# Patient Record
Sex: Female | Born: 1960 | Race: White | Hispanic: No | Smoking: Never smoker
Health system: Southern US, Community
[De-identification: ages and names within clinical notes are randomized; demographics above are authoritative.]

## PROBLEM LIST (undated history)

## (undated) DIAGNOSIS — I509 Heart failure, unspecified: Secondary | ICD-10-CM

## (undated) DIAGNOSIS — R011 Cardiac murmur, unspecified: Secondary | ICD-10-CM

## (undated) DIAGNOSIS — Z973 Presence of spectacles and contact lenses: Secondary | ICD-10-CM

## (undated) DIAGNOSIS — M069 Rheumatoid arthritis, unspecified: Secondary | ICD-10-CM

## (undated) DIAGNOSIS — F419 Anxiety disorder, unspecified: Secondary | ICD-10-CM

## (undated) DIAGNOSIS — J45909 Unspecified asthma, uncomplicated: Secondary | ICD-10-CM

## (undated) DIAGNOSIS — I10 Essential (primary) hypertension: Secondary | ICD-10-CM

## (undated) DIAGNOSIS — Z8719 Personal history of other diseases of the digestive system: Secondary | ICD-10-CM

## (undated) DIAGNOSIS — K219 Gastro-esophageal reflux disease without esophagitis: Secondary | ICD-10-CM

## (undated) DIAGNOSIS — J449 Chronic obstructive pulmonary disease, unspecified: Secondary | ICD-10-CM

## (undated) DIAGNOSIS — R51 Headache: Secondary | ICD-10-CM

## (undated) DIAGNOSIS — E039 Hypothyroidism, unspecified: Secondary | ICD-10-CM

## (undated) DIAGNOSIS — F32A Depression, unspecified: Secondary | ICD-10-CM

## (undated) DIAGNOSIS — T8484XA Pain due to internal orthopedic prosthetic devices, implants and grafts, initial encounter: Secondary | ICD-10-CM

## (undated) DIAGNOSIS — R519 Headache, unspecified: Secondary | ICD-10-CM

## (undated) DIAGNOSIS — G4700 Insomnia, unspecified: Secondary | ICD-10-CM

## (undated) DIAGNOSIS — E785 Hyperlipidemia, unspecified: Secondary | ICD-10-CM

## (undated) DIAGNOSIS — I7300 Raynaud's syndrome without gangrene: Secondary | ICD-10-CM

## (undated) DIAGNOSIS — F319 Bipolar disorder, unspecified: Secondary | ICD-10-CM

## (undated) DIAGNOSIS — Z86718 Personal history of other venous thrombosis and embolism: Secondary | ICD-10-CM

## (undated) DIAGNOSIS — I350 Nonrheumatic aortic (valve) stenosis: Secondary | ICD-10-CM

## (undated) DIAGNOSIS — G4730 Sleep apnea, unspecified: Secondary | ICD-10-CM

## (undated) DIAGNOSIS — Z972 Presence of dental prosthetic device (complete) (partial): Secondary | ICD-10-CM

## (undated) DIAGNOSIS — M349 Systemic sclerosis, unspecified: Secondary | ICD-10-CM

## (undated) DIAGNOSIS — R0600 Dyspnea, unspecified: Secondary | ICD-10-CM

## (undated) HISTORY — PX: CHOLECYSTECTOMY: SHX55

## (undated) HISTORY — DX: Hypothyroidism, unspecified: E03.9

## (undated) HISTORY — DX: Hyperlipidemia, unspecified: E78.5

## (undated) HISTORY — DX: Nonrheumatic aortic (valve) stenosis: I35.0

## (undated) HISTORY — DX: Bipolar disorder, unspecified: F31.9

## (undated) HISTORY — DX: Systemic sclerosis, unspecified: M34.9

## (undated) HISTORY — PX: MULTIPLE TOOTH EXTRACTIONS: SHX2053

## (undated) HISTORY — DX: Raynaud's syndrome without gangrene: I73.00

## (undated) HISTORY — PX: BUNIONECTOMY: SHX129

## (undated) HISTORY — PX: COLONOSCOPY WITH ESOPHAGOGASTRODUODENOSCOPY (EGD): SHX5779

## (undated) HISTORY — DX: Rheumatoid arthritis, unspecified: M06.9

## (undated) HISTORY — DX: Essential (primary) hypertension: I10

## (undated) HISTORY — DX: Gastro-esophageal reflux disease without esophagitis: K21.9

## (undated) HISTORY — PX: ABDOMINAL HYSTERECTOMY: SHX81

---

## 1997-03-23 HISTORY — PX: THROMBECTOMY: PRO61

## 1997-11-19 ENCOUNTER — Ambulatory Visit (HOSPITAL_COMMUNITY): Admission: RE | Admit: 1997-11-19 | Discharge: 1997-11-19 | Payer: Self-pay | Admitting: Rheumatology

## 1998-03-08 ENCOUNTER — Ambulatory Visit (HOSPITAL_COMMUNITY): Admission: RE | Admit: 1998-03-08 | Discharge: 1998-03-08 | Payer: Self-pay | Admitting: Rheumatology

## 1998-03-23 HISTORY — PX: OTHER SURGICAL HISTORY: SHX169

## 1998-06-18 ENCOUNTER — Ambulatory Visit (HOSPITAL_COMMUNITY): Admission: RE | Admit: 1998-06-18 | Discharge: 1998-06-18 | Payer: Self-pay | Admitting: Rheumatology

## 1998-08-06 ENCOUNTER — Encounter: Payer: Self-pay | Admitting: Rheumatology

## 1998-08-06 ENCOUNTER — Ambulatory Visit (HOSPITAL_COMMUNITY): Admission: RE | Admit: 1998-08-06 | Discharge: 1998-08-06 | Payer: Self-pay | Admitting: Rheumatology

## 1999-01-27 ENCOUNTER — Encounter: Payer: Self-pay | Admitting: Rheumatology

## 1999-01-27 ENCOUNTER — Ambulatory Visit (HOSPITAL_COMMUNITY): Admission: RE | Admit: 1999-01-27 | Discharge: 1999-01-27 | Payer: Self-pay | Admitting: Rheumatology

## 1999-11-21 ENCOUNTER — Ambulatory Visit (HOSPITAL_COMMUNITY): Admission: RE | Admit: 1999-11-21 | Discharge: 1999-11-21 | Payer: Self-pay | Admitting: *Deleted

## 2000-09-03 ENCOUNTER — Ambulatory Visit (HOSPITAL_COMMUNITY): Admission: RE | Admit: 2000-09-03 | Discharge: 2000-09-03 | Payer: Self-pay | Admitting: *Deleted

## 2000-10-12 ENCOUNTER — Ambulatory Visit (HOSPITAL_COMMUNITY): Admission: RE | Admit: 2000-10-12 | Discharge: 2000-10-12 | Payer: Self-pay | Admitting: *Deleted

## 2000-12-14 ENCOUNTER — Ambulatory Visit (HOSPITAL_COMMUNITY): Admission: RE | Admit: 2000-12-14 | Discharge: 2000-12-14 | Payer: Self-pay | Admitting: *Deleted

## 2001-07-05 ENCOUNTER — Ambulatory Visit (HOSPITAL_COMMUNITY): Admission: RE | Admit: 2001-07-05 | Discharge: 2001-07-05 | Payer: Self-pay | Admitting: *Deleted

## 2001-07-28 ENCOUNTER — Encounter: Admission: RE | Admit: 2001-07-28 | Discharge: 2001-08-12 | Payer: Self-pay | Admitting: Rheumatology

## 2001-09-09 ENCOUNTER — Ambulatory Visit (HOSPITAL_COMMUNITY): Admission: RE | Admit: 2001-09-09 | Discharge: 2001-09-09 | Payer: Self-pay | Admitting: *Deleted

## 2002-01-19 ENCOUNTER — Ambulatory Visit (HOSPITAL_COMMUNITY): Admission: RE | Admit: 2002-01-19 | Discharge: 2002-01-19 | Payer: Self-pay | Admitting: *Deleted

## 2002-02-21 ENCOUNTER — Ambulatory Visit (HOSPITAL_COMMUNITY): Admission: RE | Admit: 2002-02-21 | Discharge: 2002-02-21 | Payer: Self-pay | Admitting: *Deleted

## 2002-08-14 ENCOUNTER — Ambulatory Visit (HOSPITAL_COMMUNITY): Admission: RE | Admit: 2002-08-14 | Discharge: 2002-08-14 | Payer: Self-pay | Admitting: *Deleted

## 2003-01-01 ENCOUNTER — Ambulatory Visit (HOSPITAL_COMMUNITY): Admission: RE | Admit: 2003-01-01 | Discharge: 2003-01-01 | Payer: Self-pay | Admitting: *Deleted

## 2003-05-18 ENCOUNTER — Inpatient Hospital Stay (HOSPITAL_COMMUNITY): Admission: RE | Admit: 2003-05-18 | Discharge: 2003-05-20 | Payer: Self-pay | Admitting: Psychiatry

## 2003-07-05 ENCOUNTER — Ambulatory Visit (HOSPITAL_COMMUNITY): Admission: RE | Admit: 2003-07-05 | Discharge: 2003-07-05 | Payer: Self-pay | Admitting: *Deleted

## 2004-01-10 ENCOUNTER — Ambulatory Visit (HOSPITAL_COMMUNITY): Admission: RE | Admit: 2004-01-10 | Discharge: 2004-01-10 | Payer: Self-pay | Admitting: *Deleted

## 2004-06-19 ENCOUNTER — Ambulatory Visit (HOSPITAL_COMMUNITY): Admission: RE | Admit: 2004-06-19 | Discharge: 2004-06-19 | Payer: Self-pay | Admitting: *Deleted

## 2011-08-27 ENCOUNTER — Other Ambulatory Visit: Payer: Self-pay | Admitting: Gastroenterology

## 2011-08-27 DIAGNOSIS — R1310 Dysphagia, unspecified: Secondary | ICD-10-CM

## 2011-08-27 DIAGNOSIS — M349 Systemic sclerosis, unspecified: Secondary | ICD-10-CM

## 2011-08-27 DIAGNOSIS — R490 Dysphonia: Secondary | ICD-10-CM

## 2011-09-02 ENCOUNTER — Ambulatory Visit
Admission: RE | Admit: 2011-09-02 | Discharge: 2011-09-02 | Disposition: A | Discharge status: Home / Self Care | Payer: Medicare Other | Source: Ambulatory Visit | Attending: Gastroenterology | Admitting: Gastroenterology

## 2011-09-02 DIAGNOSIS — M349 Systemic sclerosis, unspecified: Secondary | ICD-10-CM

## 2011-09-02 DIAGNOSIS — R1310 Dysphagia, unspecified: Secondary | ICD-10-CM

## 2011-09-02 DIAGNOSIS — R490 Dysphonia: Secondary | ICD-10-CM

## 2013-10-26 ENCOUNTER — Other Ambulatory Visit (HOSPITAL_COMMUNITY): Payer: Self-pay | Admitting: Respiratory Therapy

## 2013-10-26 DIAGNOSIS — R0600 Dyspnea, unspecified: Secondary | ICD-10-CM

## 2013-10-30 ENCOUNTER — Ambulatory Visit (HOSPITAL_COMMUNITY): Payer: Medicare HMO | Attending: Rheumatology | Admitting: Cardiology

## 2013-10-30 ENCOUNTER — Other Ambulatory Visit (HOSPITAL_COMMUNITY): Payer: Medicare Other

## 2013-10-30 ENCOUNTER — Other Ambulatory Visit (HOSPITAL_COMMUNITY): Payer: Self-pay | Admitting: Rheumatology

## 2013-10-30 DIAGNOSIS — R011 Cardiac murmur, unspecified: Secondary | ICD-10-CM

## 2013-10-30 NOTE — Progress Notes (Signed)
Echo performed. 

## 2013-11-03 ENCOUNTER — Ambulatory Visit (HOSPITAL_COMMUNITY)
Admission: RE | Admit: 2013-11-03 | Discharge: 2013-11-03 | Disposition: A | Discharge status: Home / Self Care | Payer: Medicare HMO | Source: Ambulatory Visit | Attending: Rheumatology | Admitting: Rheumatology

## 2013-11-03 DIAGNOSIS — R0989 Other specified symptoms and signs involving the circulatory and respiratory systems: Principal | ICD-10-CM | POA: Insufficient documentation

## 2013-11-03 DIAGNOSIS — R0609 Other forms of dyspnea: Secondary | ICD-10-CM | POA: Insufficient documentation

## 2013-11-03 MED ORDER — ALBUTEROL SULFATE (2.5 MG/3ML) 0.083% IN NEBU
2.5000 mg | INHALATION_SOLUTION | Freq: Once | RESPIRATORY_TRACT | Status: AC
  Administered 2013-11-03: 2.5 mg via RESPIRATORY_TRACT

## 2013-11-06 LAB — PULMONARY FUNCTION TEST
DL/VA % pred: 77 %
DL/VA: 3.42 ml/min/mmHg/L
DLCO cor % pred: 71 %
DLCO cor: 14.53 ml/min/mmHg
DLCO unc % pred: 71 %
DLCO unc: 14.53 ml/min/mmHg
FEF 25-75 Post: 2.74 L/sec
FEF 25-75 Pre: 2.13 L/sec
FEF2575-%Change-Post: 28 %
FEF2575-%Pred-Post: 112 %
FEF2575-%Pred-Pre: 87 %
FEV1-%Change-Post: 6 %
FEV1-%Pred-Post: 100 %
FEV1-%Pred-Pre: 94 %
FEV1-Post: 2.46 L
FEV1-Pre: 2.31 L
FEV1FVC-%Change-Post: 3 %
FEV1FVC-%Pred-Pre: 100 %
FEV6-%Change-Post: 2 %
FEV6-%Pred-Post: 99 %
FEV6-%Pred-Pre: 96 %
FEV6-Post: 2.99 L
FEV6-Pre: 2.92 L
FEV6FVC-%Pred-Post: 103 %
FEV6FVC-%Pred-Pre: 103 %
FVC-%Change-Post: 3 %
FVC-%Pred-Post: 96 %
FVC-%Pred-Pre: 93 %
FVC-Post: 3.01 L
FVC-Pre: 2.92 L
Post FEV1/FVC ratio: 82 %
Post FEV6/FVC ratio: 100 %
Pre FEV1/FVC ratio: 79 %
Pre FEV6/FVC Ratio: 100 %
RV % pred: 86 %
RV: 1.49 L
TLC % pred: 101 %
TLC: 4.65 L

## 2015-01-21 ENCOUNTER — Ambulatory Visit (INDEPENDENT_AMBULATORY_CARE_PROVIDER_SITE_OTHER)
Admission: RE | Admit: 2015-01-21 | Discharge: 2015-01-21 | Disposition: A | Discharge status: Home / Self Care | Payer: Medicare HMO | Source: Ambulatory Visit | Attending: Internal Medicine | Admitting: Internal Medicine

## 2015-01-21 ENCOUNTER — Encounter: Payer: Self-pay | Admitting: Internal Medicine

## 2015-01-21 ENCOUNTER — Ambulatory Visit (INDEPENDENT_AMBULATORY_CARE_PROVIDER_SITE_OTHER): Payer: Medicare HMO | Admitting: Internal Medicine

## 2015-01-21 VITALS — BP 130/80 | HR 78 | Ht 63.0 in | Wt 178.4 lb

## 2015-01-21 DIAGNOSIS — R0600 Dyspnea, unspecified: Secondary | ICD-10-CM | POA: Insufficient documentation

## 2015-01-21 DIAGNOSIS — R0609 Other forms of dyspnea: Secondary | ICD-10-CM | POA: Insufficient documentation

## 2015-01-21 NOTE — Patient Instructions (Addendum)
Continue prilosec Take 30-60 min before first meal of the day   GERD (REFLUX)  is an extremely common cause of respiratory symptoms just like yours , many times with no obvious heartburn at all.    It can be treated with medication, but also with lifestyle changes including elevation of the head of your bed (ideally with 6 inch  bed blocks),  Smoking cessation, avoidance of late meals, excessive alcohol, and avoid fatty foods, chocolate, peppermint, colas, red wine, and acidic juices such as orange juice.  NO MINT OR MENTHOL PRODUCTS SO NO COUGH DROPS  USE SUGARLESS CANDY INSTEAD (Jolley ranchers or Stover's or Life Savers) or even ice chips will also do - the key is to swallow to prevent all throat clearing. NO OIL BASED VITAMINS - use powdered substitutes.     Please schedule a follow up office visit in 4-6 weeks, sooner if needed with pfts on return  Add make sure cards f/u done

## 2015-01-21 NOTE — Progress Notes (Signed)
Subjective:    Patient ID: Natasha Taylor, female    DOB: [birth year],    MRN: [id number]  HPI   59 yowf never smoker dx scleroderma 1998 eval w/u here neg and no problem until around 2011 with doe progressively worse since then referred by Dr Kathi Ludwig back to pulmonary clinic 01/21/2015    01/21/2015 1st [facility] office visit/ EPIC era / Sherene Sires   Chief Complaint  Patient presents with  . PULMONARY CONSULT    Pt referred by Dr. Yehuda Budd. for SOB. pt states shes had this problem for the last 5 years or longer  pt was diagnoised with scleroderma in 1998.  pt c/o increase SOB and coughing with exertion and chest pains.   baseline wt 135 and by 2010 gained up to over 200 on pred and then started working toward wt loss  Doe = MMRC2 = can't walk a nl pace on a flat grade s sob after a minute or so, can't do steps or inclines, indolent onset gradually worse even when wt started back down, assoc with mild dry cough with ex  No obvious other patterns in day to day or daytime variabilty or assoc   chest tightness, subjective wheeze overt sinus or hb symptoms. No unusual exp hx or h/o childhood pna/ asthma or knowledge of premature birth.  Sleeping ok without nocturnal  or early am exacerbation  of respiratory  c/o's or need for noct saba. Also denies any obvious fluctuation of symptoms with weather or environmental changes or other aggravating or alleviating factors except as outlined above   Current Medications, Allergies, Complete Past Medical History, Past Surgical History, Family History, and Social History were reviewed in [employer] record.              Review of Systems  Constitutional: Negative for fever and unexpected weight change.  HENT: Positive for postnasal drip. Negative for congestion, dental problem, ear pain, nosebleeds, rhinorrhea, sinus pressure, sneezing, sore throat and trouble swallowing.   Eyes: Negative for redness and itching.  Respiratory:  Positive for apnea, cough and shortness of breath. Negative for chest tightness and wheezing.   Cardiovascular: Positive for palpitations. Negative for leg swelling.  Gastrointestinal: Negative for nausea and vomiting.  Genitourinary: Negative for dysuria.  Musculoskeletal: Negative for joint swelling.  Skin: Negative for rash.  Neurological: Positive for headaches.  Hematological: Does not bruise/bleed easily.  Psychiatric/Behavioral: Negative for dysphoric mood. The patient is not nervous/anxious.        Objective:   Physical Exam  Pleasant amb wf nad   Wt Readings from Last 3 Encounters:  01/21/15 178 lb 6.4 oz (80.922 kg)    Vital signs reviewed   HEENT: nl dentition, turbinates, and oropharynx. Nl external ear canals without cough reflex   NECK :  without JVD/Nodes/TM/ nl carotid upstrokes bilaterally   LUNGS: no acc muscle use, clear to A and P bilaterally without cough on insp or exp maneuvers   CV:  RRR  no s3  -  Pos II-III/VI  SEM but no  increase in P2, no edema   ABD:  soft and nontender with nl excursion in the supine position. No bruits or organomegaly, bowel sounds nl  MS:  warm without deformities, calf tenderness, cyanosis or clubbing  SKIN: warm and dry without lesions    NEURO:  alert, approp, no deficits   CXR PA and Lateral:   01/21/2015 :    I personally reviewed images and agree with  radiology impression as follows:     No active cardiopulmonary disease.        Assessment & Plan:

## 2015-01-22 ENCOUNTER — Other Ambulatory Visit (HOSPITAL_COMMUNITY): Payer: Self-pay | Admitting: Rheumatology

## 2015-01-22 DIAGNOSIS — I272 Pulmonary hypertension, unspecified: Secondary | ICD-10-CM

## 2015-01-23 NOTE — Assessment & Plan Note (Signed)
Complicated by osa/ low ERV on PFTs  10/26/2013   Body mass index is 31.61 kg/(m^2).  No results found for: TSH   Contributing to gerd tendency/ doe/reviewed the need and the process to achieve and maintain neg calorie balance > defer f/u primary care including intermittently monitoring thyroid status

## 2015-01-23 NOTE — Assessment & Plan Note (Addendum)
10/30/13 echo  - Left ventricle: The cavity size was normal. There was severe focal basal and moderate concentric hypertrophy of the left ventricle. Systolic function was vigorous. The estimated ejection fraction was in the range of 65% to 70%. Wall motion was normal; there were no regional wall motion abnormalities. Doppler parameters are consistent with abnormal left ventricular relaxation (grade 1 diastolic dysfunction). Doppler parameters are consistent with elevated ventricular end-diastolic filling pressure. - Aortic valve: Trileaflet; moderately thickened, moderately calcified leaflets. There was mild to moderate stenosis. Mean gradient (S): 18 mm Hg. Peak gradient (S): 38 mm Hg. - Aortic root: The aortic root was normal in size. - Mitral valve: Structurally normal valve. There was no regurgitation. - Left atrium: The atrium was mildly dilated. - Right ventricle: Systolic function was normal. - Tricuspid valve: There was mild regurgitation. - Pulmonary arteries: Systolic pressure was within the normal range. PFTs 11/03/13 nl except ERV 30  - 01/21/2015  Walked RA x 3 laps @ 185 ft each stopped due to End of study, moderate  pace, no  desat   But cough and sob at end    Symptoms are   disproportionate to objective findings and not clear this is a lung problem but pt does appear to have difficult airway management issues.  DDX of  difficult airways management all start with A and  include Adherence, Ace Inhibitors, Acid Reflux, Active Sinus Disease, Alpha 1 Antitripsin deficiency, Anxiety masquerading as Airways dz,  ABPA,  allergy(esp in young), Aspiration (esp in elderly), Adverse effects of meds,  Active smokers, A bunch of PE's (a small clot burden can't cause this syndrome unless there is already severe underlying pulm or vascular dz with poor reserve) plus two Bs  = Bronchiectasis and Beta blocker use..and one C= CHF   Adherence is always the initial  "prime suspect" and is a multilayered concern that requires a "trust but verify" approach in every patient - starting with knowing how to use medications, especially inhalers, correctly, keeping up with refills and understanding the fundamental difference between maintenance and prns vs those medications only taken for a very short course and then stopped and not refilled.   ? Acid (or non-acid) GERD > always difficult to exclude as up to 75% of pts in some series report no assoc GI/ Heartburn symptoms and she has def abn esophogram dated  09/02/11 showing GERD> rec max (24h)  acid suppression and diet restrictions/ reviewed and instructions given in writing.   ? Allergy/ asthma > no evidence at all of that here  ? chf > concerned that her symptoms and exam are consistent with progressive aortic stenosis with cardiology follow-up planned  Total time devoted to counseling  = 35/47m review case with pt/ discussion of options/alternatives/ giving and going over instructions (see avs)      Next step is return here for full pfts

## 2015-01-24 ENCOUNTER — Other Ambulatory Visit: Payer: Self-pay

## 2015-01-24 ENCOUNTER — Ambulatory Visit (HOSPITAL_COMMUNITY): Payer: Medicare HMO | Attending: Internal Medicine

## 2015-01-24 DIAGNOSIS — I272 Other secondary pulmonary hypertension: Secondary | ICD-10-CM | POA: Diagnosis not present

## 2015-01-24 DIAGNOSIS — R0602 Shortness of breath: Secondary | ICD-10-CM | POA: Diagnosis not present

## 2015-01-24 DIAGNOSIS — I358 Other nonrheumatic aortic valve disorders: Secondary | ICD-10-CM | POA: Diagnosis not present

## 2015-01-24 DIAGNOSIS — I517 Cardiomegaly: Secondary | ICD-10-CM | POA: Insufficient documentation

## 2015-03-12 ENCOUNTER — Ambulatory Visit: Payer: Medicare HMO | Admitting: Internal Medicine

## 2015-04-15 ENCOUNTER — Ambulatory Visit (INDEPENDENT_AMBULATORY_CARE_PROVIDER_SITE_OTHER): Payer: Medicare HMO | Admitting: Internal Medicine

## 2015-04-15 ENCOUNTER — Encounter: Payer: Self-pay | Admitting: Internal Medicine

## 2015-04-15 VITALS — BP 124/72 | HR 78 | Ht 61.0 in | Wt 177.0 lb

## 2015-04-15 DIAGNOSIS — R0600 Dyspnea, unspecified: Secondary | ICD-10-CM | POA: Diagnosis not present

## 2015-04-15 DIAGNOSIS — I350 Nonrheumatic aortic (valve) stenosis: Secondary | ICD-10-CM

## 2015-04-15 LAB — PULMONARY FUNCTION TEST
DL/VA % pred: 71 %
DL/VA: 3.13 ml/min/mmHg/L
DLCO UNC: 13.25 ml/min/mmHg
DLCO unc % pred: 65 %
FEF 25-75 PRE: 2.47 L/s
FEF 25-75 Post: 2.7 L/sec
FEF2575-%Change-Post: 9 %
FEF2575-%PRED-PRE: 102 %
FEF2575-%Pred-Post: 112 %
FEV1-%Change-Post: 2 %
FEV1-%PRED-POST: 100 %
FEV1-%PRED-PRE: 97 %
FEV1-PRE: 2.35 L
FEV1-Post: 2.41 L
FEV1FVC-%Change-Post: 1 %
FEV1FVC-%Pred-Pre: 104 %
FEV6-%CHANGE-POST: 1 %
FEV6-%PRED-POST: 97 %
FEV6-%PRED-PRE: 95 %
FEV6-POST: 2.9 L
FEV6-Pre: 2.85 L
FEV6FVC-%CHANGE-POST: 0 %
FEV6FVC-%PRED-POST: 103 %
FEV6FVC-%Pred-Pre: 103 %
FVC-%Change-Post: 1 %
FVC-%PRED-PRE: 92 %
FVC-%Pred-Post: 94 %
FVC-POST: 2.9 L
FVC-Pre: 2.86 L
POST FEV6/FVC RATIO: 100 %
PRE FEV1/FVC RATIO: 82 %
Post FEV1/FVC ratio: 83 %
Pre FEV6/FVC Ratio: 100 %
RV % pred: 102 %
RV: 1.77 L
TLC % PRED: 99 %
TLC: 4.56 L

## 2015-04-15 NOTE — Assessment & Plan Note (Signed)
See Echo 01/24/2015 Left ventricle: The cavity size was normal. There was mild concentric hypertrophy. Systolic function was normal. The estimated ejection fraction was in the range of 60% to 65%. Doppler parameters are consistent with abnormal left ventricular relaxation (grade 1 diastolic dysfunction). - Aortic valve: AV is thickened, calcified with restricted motion. Peak nad mean gradients through the valve are 36 and 25 mm Hg respectivel consistent with mild to moderate AS> > referred to [facility] 04/15/2015 >>>

## 2015-04-15 NOTE — Assessment & Plan Note (Signed)
PFTs 11/03/13 nl except ERV 30  - 01/21/2015  Walked RA x 3 laps @ 185 ft each stopped due to End of study, moderate  pace, no  desat   But cough and sob at end  - PFT's 04/15/2015 wnl except DLCO 65 corrects to 71 and ERV 31   I had an extended final summary discussion with the patient reviewing all relevant studies completed to date and  lasting 15 to 20 minutes of a 25 minute visit on the following issues:    AS / obesity appears to be the greatest concern, not ILD or PAH from scleroderma > next step is cards eval   Each maintenance medication was reviewed in detail including most importantly the difference between maintenance and as needed and under what circumstances the prns are to be used.  Please see instructions for details which were reviewed in writing and the patient given a copy.

## 2015-04-15 NOTE — Patient Instructions (Addendum)
Please see patient coordinator before you leave today  to schedule cardiology evaluation at [facility]   Prilosec should be Take 30- 60 min before your first and last meals of the day   Pulmonary follow up is as needed

## 2015-04-15 NOTE — Progress Notes (Signed)
PFT done today. 

## 2015-04-15 NOTE — Progress Notes (Signed)
Subjective:    Patient ID: Natasha Taylor, female    DOB: 01/11/1961,    MRN: [id number]    Brief patient profile:  19 yowf never smoker dx scleroderma 1998 eval w/u here neg and no problem until around 2011 with doe progressively worse since then referred by Dr Kathi Ludwig back to pulmonary clinic 01/21/2015    01/21/2015 1st [facility] office visit/ EPIC era / Natasha Taylor   Chief Complaint  Patient presents with  . PULMONARY CONSULT    Pt referred by Dr. Yehuda Budd. for SOB. pt states shes had this problem for the last 5 years or longer  pt was diagnoised with scleroderma in 1998.  pt c/o increase SOB and coughing with exertion and chest pains.   baseline wt 135 and by 2010 gained up to over 200 on pred and then started working toward wt loss  Doe = MMRC2 = can't walk a nl pace on a flat grade s sob after a minute or so, can't do steps or inclines, indolent onset gradually worse even when wt started back down, assoc with mild dry cough with ex rec Continue prilosec Take 30-60 min before first meal of the day  GERD diet   Please schedule a follow up office visit in 4-6 weeks, sooner if needed with pfts on return  Add make sure cards f/u done   04/15/2015  f/u ov/Natasha Taylor re: dyspnea Chief Complaint  Patient presents with  . Follow-up    PFT done today. Pt states SOB, cough and CP are unchanged. No new co's today.  riding bike x 45 m / walking on treadmill x 30 min 3 x week and making progress and can keep up better but occ mild light headedness with ex Bad gerd on ppi bid with second dose at hs  Cough is evening > noct or early am   No obvious day to day or daytime variability or assoc excess/ purulent sputum or mucus plugs    or chest tightness, subjective wheeze or overt sinus   symptoms. No unusual exp hx or h/o childhood pna/ asthma or knowledge of premature birth.  Sleeping ok without nocturnal  or early am exacerbation  of respiratory  c/o's or need for noct saba. Also denies any obvious  fluctuation of symptoms with weather or environmental changes or other aggravating or alleviating factors except as outlined above   Current Medications, Allergies, Complete Past Medical History, Past Surgical History, Family History, and Social History were reviewed in [employer] record.  ROS  The following are not active complaints unless bolded sore throat, dysphagia, dental problems, itching, sneezing,  nasal congestion or excess/ purulent secretions, ear ache,   fever, chills, sweats, unintended wt loss, classically pleuritic or exertional cp, hemoptysis,  orthopnea pnd or leg swelling, presyncope, palpitations, abdominal pain, anorexia, nausea, vomiting, diarrhea  or change in bowel or bladder habits, change in stools or urine, dysuria,hematuria,  rash, arthralgias, visual complaints, headache, numbness, weakness or ataxia or problems with walking or coordination,  change in mood/affect or memory.                Objective:   Physical Exam  Pleasant amb wf nad  Wt Readings from Last 3 Encounters:  04/15/15 177 lb (80.287 kg)  01/21/15 178 lb 6.4 oz (80.922 kg)    Vital signs reviewed    HEENT: nl dentition, turbinates, and oropharynx. Nl external ear canals without cough reflex   NECK :  without JVD/Nodes/TM/ nl  carotid upstrokes bilaterally   LUNGS: no acc muscle use, clear to A and P bilaterally without cough on insp or exp maneuvers   CV:  RRR  no s3  -  Pos II-III/VI  SEM but no  increase in P2, no edema   ABD:  soft and nontender with nl excursion in the supine position. No bruits or organomegaly, bowel sounds nl  MS:  warm without deformities, calf tenderness, cyanosis or clubbing  SKIN: warm and dry without lesions    NEURO:  alert, approp, no deficits     CXR PA and Lateral:   01/21/2015 :    I personally reviewed images and agree with radiology impression as follows:    No active cardiopulmonary disease.   Dg Es 09/02/11 1.  Slightly dilated esophagus with diminished primary peristaltic motion most consistent with changes of scleroderma. 2. Small hiatal hernia with mild gastroesophageal reflux. Barium pill passes into the stomach without delay. 3. No evidence of aspiration. 4. Slight narrowing of the lower cervical esophagus. Question enlarged thyroid.        Assessment & Plan:

## 2015-04-22 ENCOUNTER — Other Ambulatory Visit: Payer: Self-pay | Admitting: Nurse Practitioner

## 2015-04-22 ENCOUNTER — Encounter: Payer: Self-pay | Admitting: Nurse Practitioner

## 2015-04-22 ENCOUNTER — Ambulatory Visit (INDEPENDENT_AMBULATORY_CARE_PROVIDER_SITE_OTHER): Payer: Medicare HMO | Admitting: Nurse Practitioner

## 2015-04-22 VITALS — BP 138/90 | HR 71 | Ht 63.0 in | Wt 178.6 lb

## 2015-04-22 DIAGNOSIS — R0789 Other chest pain: Secondary | ICD-10-CM | POA: Diagnosis not present

## 2015-04-22 DIAGNOSIS — R0600 Dyspnea, unspecified: Secondary | ICD-10-CM

## 2015-04-22 DIAGNOSIS — R0609 Other forms of dyspnea: Secondary | ICD-10-CM

## 2015-04-22 DIAGNOSIS — I350 Nonrheumatic aortic (valve) stenosis: Secondary | ICD-10-CM

## 2015-04-22 DIAGNOSIS — Z01818 Encounter for other preprocedural examination: Secondary | ICD-10-CM | POA: Diagnosis not present

## 2015-04-22 LAB — BASIC METABOLIC PANEL
BUN: 14 mg/dL (ref 7–25)
CO2: 26 mmol/L (ref 20–31)
Calcium: 9.6 mg/dL (ref 8.6–10.4)
Chloride: 106 mmol/L (ref 98–110)
Creat: 0.87 mg/dL (ref 0.50–1.05)
Glucose, Bld: 89 mg/dL (ref 65–99)
Potassium: 4 mmol/L (ref 3.5–5.3)
Sodium: 142 mmol/L (ref 135–146)

## 2015-04-22 LAB — CBC
HCT: 37.3 % (ref 36.0–46.0)
Hemoglobin: 11.7 g/dL — ABNORMAL LOW (ref 12.0–15.0)
MCH: 23.1 pg — ABNORMAL LOW (ref 26.0–34.0)
MCHC: 31.4 g/dL (ref 30.0–36.0)
MCV: 73.6 fL — ABNORMAL LOW (ref 78.0–100.0)
MPV: 10.1 fL (ref 8.6–12.4)
Platelets: 291 10*3/uL (ref 150–400)
RBC: 5.07 MIL/uL (ref 3.87–5.11)
RDW: 18.2 % — ABNORMAL HIGH (ref 11.5–15.5)
WBC: 6.2 10*3/uL (ref 4.0–10.5)

## 2015-04-22 NOTE — Patient Instructions (Signed)
We will be checking the following labs today - BMET, CBC, PT, PTT   Medication Instructions:    Continue with your current medicines.     Testing/Procedures To Be Arranged:  Cardiac Catheterization  Follow-Up:   Will see how your study turns out and then decide about follow up.     Other Special Instructions:  Your provider has recommended a cardiac catherization  You are scheduled for a cardiac catheterization on Wednesday, February 1st at [facility] Dr. Excell Seltzer or associate.  Go to [facility] 2nd Floor Short Stay on Wednesday, February 1st at [place].  Enter thru the [employer] entrance A No food or drink after midnight on Tuesday. You may take your medications with a sip of water on the day of your procedure.   Coronary Angiogram A coronary angiogram, also called coronary angiography, is an X-ray procedure used to look at the arteries in the heart. In this procedure, a dye (contrast dye) is injected through a long, hollow tube (catheter). The catheter is about the size of a piece of cooked spaghetti and is inserted through your groin, wrist, or arm. The dye is injected into each artery, and X-rays are then taken to show if there is a blockage in the arteries of your heart.  LET [facility] CARE PROVIDER KNOW ABOUT: Any allergies you have, including allergies to shellfish or contrast dye.  All medicines you are taking, including vitamins, herbs, eye drops, creams, and over-the-counter medicines.  Previous problems you or members of your family have had with the use of anesthetics.  Any blood disorders you have.  Previous surgeries you have had. History of kidney problems or failure.  Other medical conditions you have.  RISKS AND COMPLICATIONS  Generally, a coronary angiogram is a safe procedure. However, about 1 person out of 1000 can have problems that may include: Allergic reaction to the dye. Bleeding/bruising from the access site or other locations. Kidney injury,  especially in people with impaired kidney function. Stroke (rare). Heart attack (rare). Irregular rhythms (rare) Death (rare)  BEFORE THE PROCEDURE  Do not eat or drink anything after midnight the night before the procedure or as directed by your health care provider.  Ask your health care provider about changing or stopping your regular medicines. This is especially important if you are taking diabetes medicines or blood thinners.  PROCEDURE You may be given a medicine to help you relax (sedative) before the procedure. This medicine is given through an intravenous (IV) access tube that is inserted into one of your veins.  The area where the catheter will be inserted will be washed and shaved. This is usually done in the groin but may be done in the fold of your arm (near your elbow) or in the wrist.  A medicine will be given to numb the area where the catheter will be inserted (local anesthetic).  The health care provider will insert the catheter into an artery. The catheter will be guided by using a special type of X-ray (fluoroscopy) of the blood vessel being examined.  A special dye will then be injected into the catheter, and X-rays will be taken. The dye will help to show where any narrowing or blockages are located in the heart arteries.    AFTER THE PROCEDURE  If the procedure is done through the leg, you will be kept in bed lying flat for several hours. You will be instructed to not bend or cross your legs. The insertion site will be checked  frequently.  The pulse in your feet or wrist will be checked frequently.  Additional blood tests, X-rays, and an electrocardiogram may be done.    If you need a refill on your cardiac medications before your next appointment, please call your pharmacy.   Call the [facility] office at [phone] if you have any questions, problems or concerns.

## 2015-04-22 NOTE — Progress Notes (Signed)
CARDIOLOGY OFFICE NOTE  Date:  04/22/2015    Natasha Taylor Date of Birth: Aug 13, 1960 Medical Record #[id number]  PCP:  Mliss Sax, MD  Cardiologist:  Allred (NEW)    Chief Complaint  Patient presents with  . Cardiac Valve Problem  . Chest Pain  . Shortness of Breath    New patient visit. Seen for Dr. Johney Frame.     History of Present Illness: Natasha Taylor is a 55 y.o. female who presents today for a new patient visit. Seen with Dr. Johney Frame (DOD).   She has a history of scleroderma since 1998, hypothyroidism, HTN, HLD and GERD. She is bipolar.   Referred here by pulmonary/Dr. Sherene Sires. Has had progressive shortness of breath, chest pain and cough. Echo from back in November with aortic stenosis.   Comes in today. Here with her daughter, Natasha Taylor. She notes progressive DOE, chest pain and near syncope. She has had shortness of breath for about 2 years - getting worse. She is not currently exercising. She will have pain under the left breast with exertion - i.e., vaccuming, walking, etc. She has been lightheaded and dizzy and has previously felt like she would pass out while exerting herself. She does cough. She does not smoke. She has used inhaler without improvement. FH basically unknown - she was adopted. She has had scleroderma since 1998 - she has had to have bilateraly sympathetectomies of both wrists back in 2000 - done at [facility].   Past Medical History  Diagnosis Date  . Scleroderma (HCC)   . Hypothyroidism   . Hypertension   . Hyperlipidemia   . GERD (gastroesophageal reflux disease)   . Bipolar 1 disorder (HCC)   . Raynaud disease     Past Surgical History  Procedure Laterality Date  . Sympathomyectomies  2000    Bilateral wrists     Medications: Current Outpatient Prescriptions  Medication Sig Dispense Refill  . amLODipine (NORVASC) 5 MG tablet Take 5 mg by mouth daily.     Marland Kitchen atorvastatin (LIPITOR) 20 MG tablet Take 20 mg by mouth daily.     .  cholestyramine light (PREVALITE) 4 G packet Take 8 g by mouth daily.     . DULoxetine (CYMBALTA) 60 MG capsule 1 cap by mouth daily    . Levothyroxine Sodium 150 MCG CAPS Take 1 capsule by mouth daily.     Marland Kitchen lubiprostone (AMITIZA) 24 MCG capsule Take 24 mcg by mouth daily with breakfast.    . metoprolol succinate (TOPROL-XL) 50 MG 24 hr tablet Take 50 mg by mouth daily.     Marland Kitchen omeprazole (PRILOSEC) 40 MG capsule Take 40 mg by mouth 2 (two) times daily.     . vitamin B-12 (CYANOCOBALAMIN) 1000 MCG tablet Take 1,000 mcg by mouth daily.     Marland Kitchen zolpidem (AMBIEN) 10 MG tablet Take 1  Tab at night for sleep     No current facility-administered medications for this visit.    Allergies: No Known Allergies  Social History: The patient  reports that she has never smoked. She does not have any smokeless tobacco history on file.   Family History: The patient's family history includes Heart attack (age of onset: 62) in her sister. She was adopted.   Review of Systems: Please see the history of present illness.   Otherwise, the review of systems is positive for none.   All other systems are reviewed and negative.   Physical Exam: VS:  BP 138/90 mmHg  Pulse 71  Ht 5\' 3"  (1.6 m)  Wt 178 lb 9.6 oz (81.012 kg)  BMI 31.65 kg/m2 .  BMI Body mass index is 31.65 kg/(m^2).  Wt Readings from Last 3 Encounters:  04/22/15 178 lb 9.6 oz (81.012 kg)  04/15/15 177 lb (80.287 kg)  01/21/15 178 lb 6.4 oz (80.922 kg)    General: Pleasant. Little anxious but alert and in no acute distress.  HEENT: Normal. Neck: Supple, no JVD, carotid bruits, or masses noted.  Cardiac: Regular rate and rhythm. Harsh outflow murmur. No edema.  Respiratory:  Lungs are clear to auscultation bilaterally with normal work of breathing.  GI: Soft and nontender.  MS: No deformity or atrophy. Gait and ROM intact. Skin: Warm and dry. Color is normal.  Neuro:  Strength and sensation are intact and no gross focal deficits noted.    Psych: Alert, appropriate and with normal affect.   LABORATORY DATA:  EKG:  EKG is ordered today. This demonstrates NSR. Reviewed with Dr. 01/23/15.   No results found for: WBC, HGB, HCT, PLT, GLUCOSE, CHOL, TRIG, HDL, LDLDIRECT, LDLCALC, ALT, AST, NA, K, CL, CREATININE, BUN, CO2, TSH, PSA, INR, GLUF, HGBA1C, MICROALBUR  BNP (last 3 results) No results for input(s): BNP in the last 8760 hours.  ProBNP (last 3 results) No results for input(s): PROBNP in the last 8760 hours.   Other Studies Reviewed Today:  Echo Study Conclusions from November 2016  - Left ventricle: The cavity size was normal. There was mild concentric hypertrophy. Systolic function was normal. The estimated ejection fraction was in the range of 60% to 65%. Doppler parameters are consistent with abnormal left ventricular relaxation (grade 1 diastolic dysfunction). - Aortic valve: AV is thickened, calcified with restricted motion. Peak nad mean gradients through the valve are 36 and 25 mm Hg respectivel consistent with mild to moderate AS>  Aortic valve: AV is thickened, calcified with restricted motion. Peak nad mean gradients through the valve are 36 and 25 mm Hg respectivel consistent with mild to moderate AS> Mildly thickened leaflets. Doppler: There was no significant regurgitation.  VTI ratio of LVOT to aortic valve: 0.29. Valve area (VTI): 1.02 cm^2. Indexed valve area (VTI): 0.53 cm^2/m^2. Mean velocity ratio of LVOT to aortic valve: 0.29. Valve area (Vmean): 0.99 cm^2. Indexed valve area (Vmean): 0.52 cm^2/m^2.  Mean gradient (S): 28 mm Hg.   Echo Study Conclusions from 10/2013  - Left ventricle: The cavity size was normal. There was severe focal basal and moderate concentric hypertrophy of the left ventricle. Systolic function was vigorous. The estimated ejection fraction was in the range of 65% to 70%. Wall motion was normal; there were no regional wall motion  abnormalities. Doppler parameters are consistent with abnormal left ventricular relaxation (grade 1 diastolic dysfunction). Doppler parameters are consistent with elevated ventricular end-diastolic filling pressure. - Aortic valve: Trileaflet; moderately thickened, moderately calcified leaflets. There was mild to moderate stenosis. Mean gradient (S): 18 mm Hg. Peak gradient (S): 38 mm Hg. - Aortic root: The aortic root was normal in size. - Mitral valve: Structurally normal valve. There was no regurgitation. - Left atrium: The atrium was mildly dilated. - Right ventricle: Systolic function was normal. - Tricuspid valve: There was mild regurgitation. - Pulmonary arteries: Systolic pressure was within the normal range.  Impressions:  - Mild to moderate aortic stenosis.  Assessment/Plan: 1. Aortic stenosis - moderate by the calculations but she has lots of symptoms - chest pain/DOE/presyncope. Would recommend proceeding with left and right heart  catheterization to further discern. Procedure has been discussed and she is willing to proceed. Discussed with Dr. Allred as well - who is in agreement with this plan. Further disposition to follow. The patient understands that risks include but are not limited to stroke (1 in 1000), death (1 in 1000), kidney failure [usually temporary] (1 in 500), bleeding (1 in 200), allergic reaction [possibly serious] (1 in 200), and agrees to proceed. I will be happy to see her back and get established with general cardiology if needed.   2. Dyspnea - see #1  3. Chest pain - see #1  4. Scleroderma  Current medicines are reviewed with the patient today.  The patient does not have concerns regarding medicines other than what has been noted above.  The following changes have been made:  See above.  Labs/ tests ordered today include:    Orders Placed This Encounter  Procedures  . Basic metabolic panel  . CBC  . Protime-INR  . APTT  .  EKG 12-Lead     Disposition:   Further disposition pending.   Patient is agreeable to this plan and will call if any problems develop in the interim.   Signed: Miyanna Wiersma C. Ayrabella Labombard, RN, ANP-C 04/22/2015 11:37 AM  [facility] [address] [place], [place]  [address] Phone: [phone] Fax: [phone]         

## 2015-04-23 LAB — PROTIME-INR
INR: 0.96 (ref <1.50)
Prothrombin Time: 12.9 seconds (ref 11.6–15.2)

## 2015-04-23 LAB — APTT: aPTT: 34 seconds (ref 24–37)

## 2015-04-24 ENCOUNTER — Encounter (HOSPITAL_COMMUNITY): Payer: Self-pay | Admitting: Cardiovascular Disease

## 2015-04-24 ENCOUNTER — Encounter (HOSPITAL_COMMUNITY)
Admission: RE | Disposition: A | Discharge status: Home / Self Care | Payer: Self-pay | Source: Ambulatory Visit | Attending: Cardiovascular Disease

## 2015-04-24 ENCOUNTER — Ambulatory Visit (HOSPITAL_COMMUNITY)
Admission: RE | Admit: 2015-04-24 | Discharge: 2015-04-24 | Disposition: A | Discharge status: Home / Self Care | Payer: Medicare HMO | Source: Ambulatory Visit | Attending: Cardiovascular Disease | Admitting: Cardiovascular Disease

## 2015-04-24 DIAGNOSIS — I7300 Raynaud's syndrome without gangrene: Secondary | ICD-10-CM | POA: Diagnosis not present

## 2015-04-24 DIAGNOSIS — Z8249 Family history of ischemic heart disease and other diseases of the circulatory system: Secondary | ICD-10-CM | POA: Diagnosis not present

## 2015-04-24 DIAGNOSIS — F319 Bipolar disorder, unspecified: Secondary | ICD-10-CM | POA: Insufficient documentation

## 2015-04-24 DIAGNOSIS — K219 Gastro-esophageal reflux disease without esophagitis: Secondary | ICD-10-CM | POA: Diagnosis not present

## 2015-04-24 DIAGNOSIS — M349 Systemic sclerosis, unspecified: Secondary | ICD-10-CM | POA: Diagnosis not present

## 2015-04-24 DIAGNOSIS — I2510 Atherosclerotic heart disease of native coronary artery without angina pectoris: Secondary | ICD-10-CM | POA: Diagnosis not present

## 2015-04-24 DIAGNOSIS — E039 Hypothyroidism, unspecified: Secondary | ICD-10-CM | POA: Diagnosis not present

## 2015-04-24 DIAGNOSIS — I272 Other secondary pulmonary hypertension: Secondary | ICD-10-CM | POA: Diagnosis not present

## 2015-04-24 DIAGNOSIS — E785 Hyperlipidemia, unspecified: Secondary | ICD-10-CM | POA: Insufficient documentation

## 2015-04-24 DIAGNOSIS — I10 Essential (primary) hypertension: Secondary | ICD-10-CM | POA: Insufficient documentation

## 2015-04-24 DIAGNOSIS — I350 Nonrheumatic aortic (valve) stenosis: Secondary | ICD-10-CM | POA: Diagnosis present

## 2015-04-24 DIAGNOSIS — R079 Chest pain, unspecified: Secondary | ICD-10-CM | POA: Insufficient documentation

## 2015-04-24 HISTORY — PX: CARDIAC CATHETERIZATION: SHX172

## 2015-04-24 LAB — POCT I-STAT 3, VENOUS BLOOD GAS (G3P V)
ACID-BASE EXCESS: 2 mmol/L (ref 0.0–2.0)
Acid-base deficit: 1 mmol/L (ref 0.0–2.0)
Bicarbonate: 23.6 mEq/L (ref 20.0–24.0)
Bicarbonate: 27.1 mEq/L — ABNORMAL HIGH (ref 20.0–24.0)
O2 SAT: 73 %
O2 Saturation: 71 %
PCO2 VEN: 39.8 mmHg — AB (ref 45.0–50.0)
PH VEN: 7.381 — AB (ref 7.250–7.300)
PH VEN: 7.425 — AB (ref 7.250–7.300)
PO2 VEN: 38 mmHg (ref 30.0–45.0)
TCO2: 25 mmol/L (ref 0–100)
TCO2: 28 mmol/L (ref 0–100)
pCO2, Ven: 41.4 mmHg — ABNORMAL LOW (ref 45.0–50.0)
pO2, Ven: 38 mmHg (ref 30.0–45.0)

## 2015-04-24 LAB — POCT I-STAT 3, ART BLOOD GAS (G3+)
Acid-base deficit: 4 mmol/L — ABNORMAL HIGH (ref 0.0–2.0)
BICARBONATE: 21.6 meq/L (ref 20.0–24.0)
O2 Saturation: 92 %
PCO2 ART: 39.4 mmHg (ref 35.0–45.0)
PH ART: 7.348 — AB (ref 7.350–7.450)
PO2 ART: 66 mmHg — AB (ref 80.0–100.0)
TCO2: 23 mmol/L (ref 0–100)

## 2015-04-24 SURGERY — RIGHT/LEFT HEART CATH AND CORONARY ANGIOGRAPHY

## 2015-04-24 MED ORDER — HEPARIN (PORCINE) IN NACL 2-0.9 UNIT/ML-% IJ SOLN
INTRAMUSCULAR | Status: AC
  Filled 2015-04-24: qty 1000

## 2015-04-24 MED ORDER — DIAZEPAM 5 MG PO TABS
10.0000 mg | ORAL_TABLET | ORAL | Status: AC
  Administered 2015-04-24: 10 mg via ORAL

## 2015-04-24 MED ORDER — LIDOCAINE HCL (PF) 1 % IJ SOLN
INTRAMUSCULAR | Status: DC | PRN
  Administered 2015-04-24: 2 mL via INTRADERMAL
  Administered 2015-04-24: 5 mL via INTRADERMAL

## 2015-04-24 MED ORDER — ONDANSETRON HCL 4 MG/2ML IJ SOLN: 4.0000 mg | Freq: Four times a day (QID) | INTRAMUSCULAR | Status: DC | PRN

## 2015-04-24 MED ORDER — SODIUM CHLORIDE 0.9 % IV SOLN: 250.0000 mL | INTRAVENOUS | Status: DC | PRN

## 2015-04-24 MED ORDER — SODIUM CHLORIDE 0.9 % IV SOLN
INTRAVENOUS | Status: DC
  Administered 2015-04-24: 08:00:00 via INTRAVENOUS

## 2015-04-24 MED ORDER — IOHEXOL 350 MG/ML SOLN
INTRAVENOUS | Status: DC | PRN
  Administered 2015-04-24: 50 mL via INTRA_ARTERIAL

## 2015-04-24 MED ORDER — LIDOCAINE HCL (PF) 1 % IJ SOLN
INTRAMUSCULAR | Status: AC
  Filled 2015-04-24: qty 30

## 2015-04-24 MED ORDER — HEPARIN SODIUM (PORCINE) 1000 UNIT/ML IJ SOLN
INTRAMUSCULAR | Status: AC
  Filled 2015-04-24: qty 1

## 2015-04-24 MED ORDER — FENTANYL CITRATE (PF) 100 MCG/2ML IJ SOLN
INTRAMUSCULAR | Status: AC
Start: 2015-04-24 — End: 2015-04-24
  Filled 2015-04-24: qty 2

## 2015-04-24 MED ORDER — DIAZEPAM 5 MG PO TABS
ORAL_TABLET | ORAL | Status: AC
Start: 2015-04-24 — End: 2015-04-24
  Administered 2015-04-24: 10 mg via ORAL
  Filled 2015-04-24: qty 2

## 2015-04-24 MED ORDER — ACETAMINOPHEN 325 MG PO TABS: 650.0000 mg | ORAL_TABLET | ORAL | Status: DC | PRN

## 2015-04-24 MED ORDER — SODIUM CHLORIDE 0.9% FLUSH: 3.0000 mL | Freq: Two times a day (BID) | INTRAVENOUS | Status: DC

## 2015-04-24 MED ORDER — SODIUM CHLORIDE 0.9% FLUSH: 3.0000 mL | INTRAVENOUS | Status: DC | PRN

## 2015-04-24 MED ORDER — ATORVASTATIN CALCIUM 20 MG PO TABS: 20.0000 mg | ORAL_TABLET | ORAL | Status: DC

## 2015-04-24 MED ORDER — FENTANYL CITRATE (PF) 100 MCG/2ML IJ SOLN
INTRAMUSCULAR | Status: DC | PRN
  Administered 2015-04-24: 25 ug via INTRAVENOUS

## 2015-04-24 MED ORDER — VERAPAMIL HCL 2.5 MG/ML IV SOLN
INTRAVENOUS | Status: DC | PRN
  Administered 2015-04-24: 10 mL via INTRA_ARTERIAL

## 2015-04-24 MED ORDER — ATORVASTATIN CALCIUM 80 MG PO TABS
80.0000 mg | ORAL_TABLET | Freq: Every day | ORAL | Status: DC
  Filled 2015-04-24: qty 1

## 2015-04-24 MED ORDER — SODIUM CHLORIDE 0.9 % IV SOLN: INTRAVENOUS | Status: DC

## 2015-04-24 MED ORDER — HEPARIN SODIUM (PORCINE) 1000 UNIT/ML IJ SOLN
INTRAMUSCULAR | Status: DC | PRN
  Administered 2015-04-24: 4000 [IU] via INTRAVENOUS

## 2015-04-24 MED ORDER — MIDAZOLAM HCL 2 MG/2ML IJ SOLN
INTRAMUSCULAR | Status: AC
  Filled 2015-04-24: qty 2

## 2015-04-24 MED ORDER — ASPIRIN 81 MG PO CHEW
81.0000 mg | CHEWABLE_TABLET | ORAL | Status: AC
  Administered 2015-04-24: 81 mg via ORAL

## 2015-04-24 MED ORDER — ASPIRIN 81 MG PO CHEW
CHEWABLE_TABLET | ORAL | Status: AC
  Administered 2015-04-24: 81 mg via ORAL
  Filled 2015-04-24: qty 1

## 2015-04-24 MED ORDER — VERAPAMIL HCL 2.5 MG/ML IV SOLN
INTRAVENOUS | Status: AC
  Filled 2015-04-24: qty 2

## 2015-04-24 MED ORDER — MIDAZOLAM HCL 2 MG/2ML IJ SOLN
INTRAMUSCULAR | Status: DC | PRN
  Administered 2015-04-24: 2 mg via INTRAVENOUS

## 2015-04-24 MED ORDER — HEPARIN (PORCINE) IN NACL 2-0.9 UNIT/ML-% IJ SOLN
INTRAMUSCULAR | Status: DC | PRN
  Administered 2015-04-24: 1000 mL

## 2015-04-24 SURGICAL SUPPLY — 15 items
CATH BALLN WEDGE 5F 110CM (CATHETERS) ×3 IMPLANT
CATH INFINITI 5 FR JL3.5 (CATHETERS) ×3 IMPLANT
CATH INFINITI JR4 5F (CATHETERS) ×3 IMPLANT
COVER PRB 48X5XTLSCP FOLD TPE (BAG) ×1 IMPLANT
COVER PROBE 5X48 (BAG) ×2
DEVICE RAD COMP TR BAND LRG (VASCULAR PRODUCTS) ×3 IMPLANT
GLIDESHEATH SLEND SS 6F .021 (SHEATH) ×3 IMPLANT
KIT HEART LEFT (KITS) ×3 IMPLANT
KIT HEART RIGHT NAMIC (KITS) ×3 IMPLANT
PACK CARDIAC CATHETERIZATION (CUSTOM PROCEDURE TRAY) ×3 IMPLANT
SHEATH FAST CATH BRACH 5F 5CM (SHEATH) ×3 IMPLANT
TRANSDUCER W/STOPCOCK (MISCELLANEOUS) ×3 IMPLANT
TUBING CIL FLEX 10 FLL-RA (TUBING) ×3 IMPLANT
WIRE HI TORQ VERSACORE-J 145CM (WIRE) ×3 IMPLANT
WIRE SAFE-T 1.5MM-J .035X260CM (WIRE) ×3 IMPLANT

## 2015-04-24 NOTE — Discharge Instructions (Signed)
Radial Site Care °Refer to this sheet in the next few weeks. These instructions provide you with information about caring for yourself after your procedure. Your health care provider may also give you more specific instructions. Your treatment has been planned according to current medical practices, but problems sometimes occur. Call your health care provider if you have any problems or questions after your procedure. °WHAT TO EXPECT AFTER THE PROCEDURE °After your procedure, it is typical to have the following: °· Bruising at the radial site that usually fades within 1-2 weeks. °· Blood collecting in the tissue (hematoma) that may be painful to the touch. It should usually decrease in size and tenderness within 1-2 weeks. °HOME CARE INSTRUCTIONS °· Take medicines only as directed by your health care provider. °· You may shower 24-48 hours after the procedure or as directed by your health care provider. Remove the bandage (dressing) and gently wash the site with plain soap and water. Pat the area dry with a clean towel. Do not rub the site, because this may cause bleeding. °· Do not take baths, swim, or use a hot tub until your health care provider approves. °· Check your insertion site every day for redness, swelling, or drainage. °· Do not apply powder or lotion to the site. °· Do not flex or bend the affected arm for 24 hours or as directed by your health care provider. °· Do not push or pull heavy objects with the affected arm for 24 hours or as directed by your health care provider. °· Do not lift over 10 lb (4.5 kg) for 5 days after your procedure or as directed by your health care provider. °· Ask your health care provider when it is okay to: °¨ Return to work or school. °¨ Resume usual physical activities or sports. °¨ Resume sexual activity. °· Do not drive home if you are discharged the same day as the procedure. Have someone else drive you. °· You may drive 24 hours after the procedure unless otherwise  instructed by your health care provider. °· Do not operate machinery or power tools for 24 hours after the procedure. °· If your procedure was done as an outpatient procedure, which means that you went home the same day as your procedure, a responsible adult should be with you for the first 24 hours after you arrive home. °· Keep all follow-up visits as directed by your health care provider. This is important. °SEEK MEDICAL CARE IF: °· You have a fever. °· You have chills. °· You have increased bleeding from the radial site. Hold pressure on the site. °SEEK IMMEDIATE MEDICAL CARE IF: °· You have unusual pain at the radial site. °· You have redness, warmth, or swelling at the radial site. °· You have drainage (other than a small amount of blood on the dressing) from the radial site. °· The radial site is bleeding, and the bleeding does not stop after 30 minutes of holding steady pressure on the site. °· Your arm or hand becomes pale, cool, tingly, or numb. °  °This information is not intended to replace advice given to you by your health care provider. Make sure you discuss any questions you have with your health care provider. °  °Document Released: 04/11/2010 Document Revised: 03/30/2014 Document Reviewed: 09/25/2013 °Elsevier Interactive Patient Education ©2016 [employer] ° °

## 2015-04-24 NOTE — Interval H&P Note (Signed)
Cath Lab Visit (complete for each Cath Lab visit)  Clinical Evaluation Leading to the Procedure:   ACS: No.  Non-ACS:    Anginal Classification: CCS II  Anti-ischemic medical therapy: Maximal Therapy (2 or more classes of medications)  Non-Invasive Test Results: No non-invasive testing performed  Prior CABG: No previous CABG      History and Physical Interval Note:  04/24/2015 8:44 AM  Natasha Taylor  has presented today for surgery, with the diagnosis of arotic stenosis  The various methods of treatment have been discussed with the patient and family. After consideration of risks, benefits and other options for treatment, the patient has consented to  Procedure(s): Right/Left Heart Cath and Coronary Angiography (N/A) as a surgical intervention .  The patient's history has been reviewed, patient examined, no change in status, stable for surgery.  I have reviewed the patient's chart and labs.  Questions were answered to the patient's satisfaction.     Tonny Bollman

## 2015-04-24 NOTE — H&P (View-Only) (Signed)
CARDIOLOGY OFFICE NOTE  Date:  04/22/2015    Natasha Taylor Date of Birth: Aug 13, 1960 Medical Record #[id number]  PCP:  Mliss Sax, MD  Cardiologist:  Allred (NEW)    Chief Complaint  Patient presents with  . Cardiac Valve Problem  . Chest Pain  . Shortness of Breath    New patient visit. Seen for Dr. Johney Frame.     History of Present Illness: Natasha Taylor is a 55 y.o. female who presents today for a new patient visit. Seen with Dr. Johney Frame (DOD).   She has a history of scleroderma since 1998, hypothyroidism, HTN, HLD and GERD. She is bipolar.   Referred here by pulmonary/Dr. Sherene Sires. Has had progressive shortness of breath, chest pain and cough. Echo from back in November with aortic stenosis.   Comes in today. Here with her daughter, Summer. She notes progressive DOE, chest pain and near syncope. She has had shortness of breath for about 2 years - getting worse. She is not currently exercising. She will have pain under the left breast with exertion - i.e., vaccuming, walking, etc. She has been lightheaded and dizzy and has previously felt like she would pass out while exerting herself. She does cough. She does not smoke. She has used inhaler without improvement. FH basically unknown - she was adopted. She has had scleroderma since 1998 - she has had to have bilateraly sympathetectomies of both wrists back in 2000 - done at [facility].   Past Medical History  Diagnosis Date  . Scleroderma (HCC)   . Hypothyroidism   . Hypertension   . Hyperlipidemia   . GERD (gastroesophageal reflux disease)   . Bipolar 1 disorder (HCC)   . Raynaud disease     Past Surgical History  Procedure Laterality Date  . Sympathomyectomies  2000    Bilateral wrists     Medications: Current Outpatient Prescriptions  Medication Sig Dispense Refill  . amLODipine (NORVASC) 5 MG tablet Take 5 mg by mouth daily.     Marland Kitchen atorvastatin (LIPITOR) 20 MG tablet Take 20 mg by mouth daily.     .  cholestyramine light (PREVALITE) 4 G packet Take 8 g by mouth daily.     . DULoxetine (CYMBALTA) 60 MG capsule 1 cap by mouth daily    . Levothyroxine Sodium 150 MCG CAPS Take 1 capsule by mouth daily.     Marland Kitchen lubiprostone (AMITIZA) 24 MCG capsule Take 24 mcg by mouth daily with breakfast.    . metoprolol succinate (TOPROL-XL) 50 MG 24 hr tablet Take 50 mg by mouth daily.     Marland Kitchen omeprazole (PRILOSEC) 40 MG capsule Take 40 mg by mouth 2 (two) times daily.     . vitamin B-12 (CYANOCOBALAMIN) 1000 MCG tablet Take 1,000 mcg by mouth daily.     Marland Kitchen zolpidem (AMBIEN) 10 MG tablet Take 1  Tab at night for sleep     No current facility-administered medications for this visit.    Allergies: No Known Allergies  Social History: The patient  reports that she has never smoked. She does not have any smokeless tobacco history on file.   Family History: The patient's family history includes Heart attack (age of onset: 62) in her sister. She was adopted.   Review of Systems: Please see the history of present illness.   Otherwise, the review of systems is positive for none.   All other systems are reviewed and negative.   Physical Exam: VS:  BP 138/90 mmHg  Pulse 71  Ht 5\' 3"  (1.6 m)  Wt 178 lb 9.6 oz (81.012 kg)  BMI 31.65 kg/m2 .  BMI Body mass index is 31.65 kg/(m^2).  Wt Readings from Last 3 Encounters:  04/22/15 178 lb 9.6 oz (81.012 kg)  04/15/15 177 lb (80.287 kg)  01/21/15 178 lb 6.4 oz (80.922 kg)    General: Pleasant. Little anxious but alert and in no acute distress.  HEENT: Normal. Neck: Supple, no JVD, carotid bruits, or masses noted.  Cardiac: Regular rate and rhythm. Harsh outflow murmur. No edema.  Respiratory:  Lungs are clear to auscultation bilaterally with normal work of breathing.  GI: Soft and nontender.  MS: No deformity or atrophy. Gait and ROM intact. Skin: Warm and dry. Color is normal.  Neuro:  Strength and sensation are intact and no gross focal deficits noted.    Psych: Alert, appropriate and with normal affect.   LABORATORY DATA:  EKG:  EKG is ordered today. This demonstrates NSR. Reviewed with Dr. 01/23/15.   No results found for: WBC, HGB, HCT, PLT, GLUCOSE, CHOL, TRIG, HDL, LDLDIRECT, LDLCALC, ALT, AST, NA, K, CL, CREATININE, BUN, CO2, TSH, PSA, INR, GLUF, HGBA1C, MICROALBUR  BNP (last 3 results) No results for input(s): BNP in the last 8760 hours.  ProBNP (last 3 results) No results for input(s): PROBNP in the last 8760 hours.   Other Studies Reviewed Today:  Echo Study Conclusions from November 2016  - Left ventricle: The cavity size was normal. There was mild concentric hypertrophy. Systolic function was normal. The estimated ejection fraction was in the range of 60% to 65%. Doppler parameters are consistent with abnormal left ventricular relaxation (grade 1 diastolic dysfunction). - Aortic valve: AV is thickened, calcified with restricted motion. Peak nad mean gradients through the valve are 36 and 25 mm Hg respectivel consistent with mild to moderate AS>  Aortic valve: AV is thickened, calcified with restricted motion. Peak nad mean gradients through the valve are 36 and 25 mm Hg respectivel consistent with mild to moderate AS> Mildly thickened leaflets. Doppler: There was no significant regurgitation.  VTI ratio of LVOT to aortic valve: 0.29. Valve area (VTI): 1.02 cm^2. Indexed valve area (VTI): 0.53 cm^2/m^2. Mean velocity ratio of LVOT to aortic valve: 0.29. Valve area (Vmean): 0.99 cm^2. Indexed valve area (Vmean): 0.52 cm^2/m^2.  Mean gradient (S): 28 mm Hg.   Echo Study Conclusions from 10/2013  - Left ventricle: The cavity size was normal. There was severe focal basal and moderate concentric hypertrophy of the left ventricle. Systolic function was vigorous. The estimated ejection fraction was in the range of 65% to 70%. Wall motion was normal; there were no regional wall motion  abnormalities. Doppler parameters are consistent with abnormal left ventricular relaxation (grade 1 diastolic dysfunction). Doppler parameters are consistent with elevated ventricular end-diastolic filling pressure. - Aortic valve: Trileaflet; moderately thickened, moderately calcified leaflets. There was mild to moderate stenosis. Mean gradient (S): 18 mm Hg. Peak gradient (S): 38 mm Hg. - Aortic root: The aortic root was normal in size. - Mitral valve: Structurally normal valve. There was no regurgitation. - Left atrium: The atrium was mildly dilated. - Right ventricle: Systolic function was normal. - Tricuspid valve: There was mild regurgitation. - Pulmonary arteries: Systolic pressure was within the normal range.  Impressions:  - Mild to moderate aortic stenosis.  Assessment/Plan: 1. Aortic stenosis - moderate by the calculations but she has lots of symptoms - chest pain/DOE/presyncope. Would recommend proceeding with left and right heart  catheterization to further discern. Procedure has been discussed and she is willing to proceed. Discussed with Dr. Johney Frame as well - who is in agreement with this plan. Further disposition to follow. The patient understands that risks include but are not limited to stroke (1 in 1000), death (1 in 1000), kidney failure [usually temporary] (1 in 500), bleeding (1 in 200), allergic reaction [possibly serious] (1 in 200), and agrees to proceed. I will be happy to see her back and get established with general cardiology if needed.   2. Dyspnea - see #1  3. Chest pain - see #1  4. Scleroderma  Current medicines are reviewed with the patient today.  The patient does not have concerns regarding medicines other than what has been noted above.  The following changes have been made:  See above.  Labs/ tests ordered today include:    Orders Placed This Encounter  Procedures  . Basic metabolic panel  . CBC  . Protime-INR  . APTT  .  EKG 12-Lead     Disposition:   Further disposition pending.   Patient is agreeable to this plan and will call if any problems develop in the interim.   Signed: Rosalio Macadamia, RN, ANP-C 04/22/2015 11:37 AM  [facility] [address] [place], [place]  [address] Phone: [phone] Fax: [phone]

## 2015-04-25 ENCOUNTER — Telehealth: Payer: Self-pay | Admitting: Cardiovascular Disease

## 2015-04-25 MED FILL — Heparin Sodium (Porcine) 2 Unit/ML in Sodium Chloride 0.9%: INTRAMUSCULAR | Qty: 500 | Status: AC

## 2015-04-25 NOTE — Telephone Encounter (Signed)
Reviewed 04/24/2015 cardiac cath findings and at this time the pt does not have any follow-up.  I will forward this message to Dr Excell Seltzer to review.

## 2015-04-25 NOTE — Telephone Encounter (Signed)
She can resume normal activities but should avoid heavy lifting x 1 week. Ok to start exercising anytime. Should see Lawson Fiscal in 6 months. thx

## 2015-04-25 NOTE — Telephone Encounter (Signed)
New message   Pt wants to know when she can start back working out

## 2015-04-26 NOTE — Telephone Encounter (Signed)
I spoke with the pt and made her aware of Dr Earmon Phoenix recommendations. I will place recall into the system for 6 months with Norma Fredrickson NP.

## 2015-10-25 ENCOUNTER — Encounter (INDEPENDENT_AMBULATORY_CARE_PROVIDER_SITE_OTHER): Payer: Self-pay

## 2015-10-25 ENCOUNTER — Encounter: Payer: Self-pay | Admitting: Nurse Practitioner

## 2015-10-25 ENCOUNTER — Ambulatory Visit (INDEPENDENT_AMBULATORY_CARE_PROVIDER_SITE_OTHER): Payer: Medicare HMO | Admitting: Nurse Practitioner

## 2015-10-25 VITALS — BP 140/90 | HR 82 | Ht 63.0 in | Wt 177.9 lb

## 2015-10-25 DIAGNOSIS — R0609 Other forms of dyspnea: Secondary | ICD-10-CM | POA: Diagnosis not present

## 2015-10-25 DIAGNOSIS — R0789 Other chest pain: Secondary | ICD-10-CM | POA: Diagnosis not present

## 2015-10-25 DIAGNOSIS — I350 Nonrheumatic aortic (valve) stenosis: Secondary | ICD-10-CM | POA: Diagnosis not present

## 2015-10-25 DIAGNOSIS — R0600 Dyspnea, unspecified: Secondary | ICD-10-CM

## 2015-10-25 NOTE — Patient Instructions (Addendum)
We will be checking the following labs today - NONE   Medication Instructions:    Continue with your current medicines.     Testing/Procedures To Be Arranged:  Echocardiogram in November of 2017  Follow-Up:   See Dr. Jens Som in the [facility] office after echo in November/December    Other Special Instructions:   Try to get back to your walking.     If you need a refill on your cardiac medications before your next appointment, please call your pharmacy.   Call the [facility] office at [phone] if you have any questions, problems or concerns.

## 2015-10-25 NOTE — Progress Notes (Signed)
CARDIOLOGY OFFICE NOTE  Date:  10/25/2015    [name] Date of Birth: Jul 30, 1960 Medical Record #[id number]  PCP:  Mliss Sax, MD  Cardiologist:  Not assigned. Previously seen with Dr. Johney Frame when DOD  Chief Complaint  Patient presents with  . Coronary Artery Disease  . Cardiac Valve Problem    Follow up visit - seen for Dr. Johney Frame    History of Present Illness: Natasha Taylor is a 55 y.o. female who presents today for a 6 month check.  Seen for Dr. Johney Frame. Will need to establish with general cardiology going forward. Will see about establishing with Dr. Jens Som.   She has a history of scleroderma since 1998, hypothyroidism, HTN, HLD and GERD. She is bipolar.   Referred here by pulmonary/Dr. Sherene Sires back in January with progressive shortness of breath, chest pain and cough. Echo from back in November with aortic stenosis. We referred her on for cardiac cath - see below - to continue with CV risk factor modification and continued surveillance for her AS.   Comes in today. Here alone. She feels like she is "fine". She has continued to have some right shoulder pain - this is chronic. She has had a fall since she was last here - fell on the floor after putting furniture polish on them. Not short of breath. No syncope. She was exercising and losing weight until she fell. Tolerating her medicines. Labs are checked by PCP.  Past Medical History:  Diagnosis Date  . Bipolar 1 disorder (HCC)   . GERD (gastroesophageal reflux disease)   . Hyperlipidemia   . Hypertension   . Hypothyroidism   . Raynaud disease   . Scleroderma [facility])     Past Surgical History:  Procedure Laterality Date  . CARDIAC CATHETERIZATION N/A 04/24/2015   Procedure: Right/Left Heart Cath and Coronary Angiography;  Surgeon: Tonny Bollman, MD;  Location: [facility] INVASIVE CV LAB;  Service: Cardiovascular;  Laterality: N/A;  . sympathomyectomies  2000   Bilateral wrists      Medications: Current Outpatient Prescriptions  Medication Sig Dispense Refill  . albuterol (PROVENTIL HFA;VENTOLIN HFA) 108 (90 Base) MCG/ACT inhaler Inhale into the lungs.    Marland Kitchen amLODipine (NORVASC) 5 MG tablet Take 5 mg by mouth daily.     Marland Kitchen atorvastatin (LIPITOR) 40 MG tablet Take 40 mg by mouth daily.    Marland Kitchen doxycycline (VIBRAMYCIN) 100 MG capsule Take 100 mg by mouth daily.     . DULoxetine (CYMBALTA) 60 MG capsule 1 cap by mouth daily    . gabapentin (NEURONTIN) 100 MG capsule Take 100 mg by mouth 3 (three) times daily.     Marland Kitchen levothyroxine (SYNTHROID, LEVOTHROID) 112 MCG tablet Take 112 mcg by mouth daily before breakfast.    . lubiprostone (AMITIZA) 24 MCG capsule Take 24 mcg by mouth daily with breakfast.    . Melatonin 5 MG CAPS Take 5 mg by mouth at bedtime as needed (for sleep).    . metoprolol succinate (TOPROL-XL) 50 MG 24 hr tablet Take 50 mg by mouth daily.     . Multiple Vitamin (MULTIVITAMIN WITH MINERALS) TABS tablet Take 1 tablet by mouth daily.    . Omega-3 Fatty Acids (EQL OMEGA 3 FISH OIL) 1000 MG CAPS Take 1 tablet by mouth 2 (two) times daily.    Marland Kitchen omeprazole (PRILOSEC) 40 MG capsule Take 40 mg by mouth 2 (two) times daily.     Marland Kitchen tretinoin (RETIN-A) 0.05 % cream Apply 1 application  topically at bedtime.    . vitamin B-12 (CYANOCOBALAMIN) 1000 MCG tablet Take 1,000 mcg by mouth daily.     . Vitamin D, Cholecalciferol, 1000 units CAPS Take 1,000 Units by mouth daily.    Marland Kitchen zolpidem (AMBIEN) 10 MG tablet Take 1  Tab at night for sleep     No current facility-administered medications for this visit.     Allergies: Allergies  Allergen Reactions  . Morphine And Related Hives and Itching    Social History: The patient  reports that she has never smoked. She does not have any smokeless tobacco history on file.   Family History: The patient's family history includes Heart attack (age of onset: 58) in her sister. She was adopted.   Review of Systems: Please see  the history of present illness.   Otherwise, the review of systems is positive for none.   All other systems are reviewed and negative.   Physical Exam: VS:  BP 140/90   Pulse 82   Ht 5\' 3"  (1.6 m)   Wt 177 lb 13.9 oz (80.7 kg)   BMI 31.51 kg/m  .  BMI Body mass index is 31.51 kg/m.  Wt Readings from Last 3 Encounters:  10/25/15 177 lb 13.9 oz (80.7 kg)  04/24/15 177 lb (80.3 kg)  04/22/15 178 lb 9.6 oz (81 kg)   BP by me is 135/90.  General: Pleasant. She seems anxious. She is alert and in no acute distress.   HEENT: Normal.  Neck: Supple, no JVD, carotid bruits, or masses noted.  Cardiac: Regular rate and rhythm. Harsh outflow murmur. No edema.  Respiratory:  Lungs are clear to auscultation bilaterally with normal work of breathing.  GI: Soft and nontender.  MS: No deformity or atrophy. Gait and ROM intact.  Skin: Warm and dry. Color is normal.  Neuro:  Strength and sensation are intact and no gross focal deficits noted.  Psych: Alert, appropriate and with normal affect.   LABORATORY DATA:  EKG:  EKG is ordered today. This shows NSR  Lab Results  Component Value Date   WBC 6.2 04/22/2015   HGB 11.7 (L) 04/22/2015   HCT 37.3 04/22/2015   PLT 291 04/22/2015   GLUCOSE 89 04/22/2015   NA 142 04/22/2015   K 4.0 04/22/2015   CL 106 04/22/2015   CREATININE 0.87 04/22/2015   BUN 14 04/22/2015   CO2 26 04/22/2015   INR 0.96 04/22/2015    BNP (last 3 results) No results for input(s): BNP in the last 8760 hours.  ProBNP (last 3 results) No results for input(s): PROBNP in the last 8760 hours.   Other Studies Reviewed Today:  Echo Study Conclusions from November 2016  - Left ventricle: The cavity size was normal. There was mild concentric hypertrophy. Systolic function was normal. The estimated ejection fraction was in the range of 60% to 65%. Doppler parameters are consistent with abnormal left ventricular relaxation (grade 1 diastolic dysfunction). -  Aortic valve: AV is thickened, calcified with restricted motion. Peak nad mean gradients through the valve are 36 and 25 mm Hg respectivel consistent with mild to moderate AS>  Aortic valve: AV is thickened, calcified with restricted motion. Peak nad mean gradients through the valve are 36 and 25 mm Hg respectivel consistent with mild to moderate AS> Mildly thickened leaflets. Doppler: There was no significant regurgitation.  VTI ratio of LVOT to aortic valve: 0.29. Valve area (VTI): 1.02 cm^2. Indexed valve area (VTI): 0.53 cm^2/m^2. Mean velocity ratio of  LVOT to aortic valve: 0.29. Valve area (Vmean): 0.99 cm^2. Indexed valve area (Vmean): 0.52 cm^2/m^2.  Mean gradient (S): 28 mm Hg.   Echo Study Conclusions from 10/2013  - Left ventricle: The cavity size was normal. There was severe focal basal and moderate concentric hypertrophy of the left ventricle. Systolic function was vigorous. The estimated ejection fraction was in the range of 65% to 70%. Wall motion was normal; there were no regional wall motion abnormalities. Doppler parameters are consistent with abnormal left ventricular relaxation (grade 1 diastolic dysfunction). Doppler parameters are consistent with elevated ventricular end-diastolic filling pressure. - Aortic valve: Trileaflet; moderately thickened, moderately calcified leaflets. There was mild to moderate stenosis. Mean gradient (S): 18 mm Hg. Peak gradient (S): 38 mm Hg. - Aortic root: The aortic root was normal in size. - Mitral valve: Structurally normal valve. There was no regurgitation. - Left atrium: The atrium was mildly dilated. - Right ventricle: Systolic function was normal. - Tricuspid valve: There was mild regurgitation. - Pulmonary arteries: Systolic pressure was within the normal range.  Impressions:  - Mild to moderate aortic stenosis.   Cardiac Cath Conclusion from 04/2015    Prox LAD lesion, 25%  stenosed.   1. Moderate aortic stenosis with a mean transaortic gradient of 21 mmHg 2. 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 Mild pulmonary HTN with mean PAP 26 mmHg but normal transpulmonic gradient suspect diastolic dysfunction  Recommend medical therapy, risk factor modification, and ongoing surveillance of aortic stenosis with yearly echo studies.      Assessment/Plan: 1. Aortic stenosis - moderate by cath from January of this year - needs echo in November. Would like to get her established with Dr. Jens Som going forward for general cardiology - she would like to see him the [facility] office. She has no cardinal symptoms at this time.   2. Dyspnea - seems resolved. Reports being released from Pulmonary  3. Scleroderma  4. HLD - on statin therapy - labs are checked by PCP  Current medicines are reviewed with the patient today.  The patient does not have concerns regarding medicines other than what has been noted above.  The following changes have been made:  See above.  Labs/ tests ordered today include:    Orders Placed This Encounter  Procedures  . EKG 12-Lead  . ECHOCARDIOGRAM COMPLETE     Disposition:   FU with Dr. Jens Som in November/December after echocardiogram.  Patient is agreeable to this plan and will call if any problems develop in the interim.   Signed: Rosalio Macadamia, RN, ANP-C 10/25/2015 3:01 PM  [facility] [address] [place], [place]  [address] Phone: [phone] Fax: [phone]

## 2016-01-21 ENCOUNTER — Encounter: Payer: Self-pay | Admitting: Cardiology

## 2016-01-28 ENCOUNTER — Ambulatory Visit (HOSPITAL_COMMUNITY): Payer: Medicare HMO | Attending: Cardiology

## 2016-01-28 ENCOUNTER — Other Ambulatory Visit: Payer: Self-pay

## 2016-01-28 DIAGNOSIS — R0609 Other forms of dyspnea: Secondary | ICD-10-CM | POA: Insufficient documentation

## 2016-01-28 DIAGNOSIS — R0789 Other chest pain: Secondary | ICD-10-CM | POA: Diagnosis not present

## 2016-01-28 DIAGNOSIS — I350 Nonrheumatic aortic (valve) stenosis: Secondary | ICD-10-CM | POA: Diagnosis not present

## 2016-01-29 ENCOUNTER — Telehealth: Payer: Self-pay | Admitting: Nurse Practitioner

## 2016-01-29 NOTE — Telephone Encounter (Signed)
Follow Up:     Arely returning your call

## 2016-02-05 ENCOUNTER — Ambulatory Visit: Payer: Medicare HMO | Admitting: Cardiology

## 2016-02-19 NOTE — Progress Notes (Signed)
HPI: Follow-up aortic stenosis. Cardiac catheterization February 2017 showed a 25% proximal LAD lesion. Moderate aortic stenosis with mean gradient 21 mmHg. Echocardiogram November 2017 showed normal LV systolic function, grade 1 diastolic dysfunction, moderate aortic stenosis with mean gradient 25 mmHg, mild aortic insufficiency. Since last seen, she has some dyspnea on exertion but no orthopnea, PND or pedal edema. She occasionally has pain in her right shoulder radiating to her chest. It is unchanged compared to February. She also has pain under her left breast when exerting herself. This too is unchanged compared to February 2017 when she had her cardiac catheterization.   Current Outpatient Prescriptions  Medication Sig Dispense Refill  . albuterol (PROVENTIL HFA;VENTOLIN HFA) 108 (90 Base) MCG/ACT inhaler Inhale into the lungs.    Marland Kitchen amLODipine (NORVASC) 5 MG tablet Take 5 mg by mouth daily.     Marland Kitchen atorvastatin (LIPITOR) 40 MG tablet Take 40 mg by mouth daily.    Marland Kitchen doxycycline (VIBRAMYCIN) 100 MG capsule Take 100 mg by mouth daily.     . DULoxetine (CYMBALTA) 60 MG capsule 1 cap by mouth daily    . levothyroxine (SYNTHROID, LEVOTHROID) 112 MCG tablet Take 112 mcg by mouth daily before breakfast.    . lubiprostone (AMITIZA) 24 MCG capsule Take 24 mcg by mouth daily with breakfast.    . Melatonin 5 MG CAPS Take 5 mg by mouth at bedtime as needed (for sleep).    . metoprolol succinate (TOPROL-XL) 50 MG 24 hr tablet Take 50 mg by mouth daily.     . Multiple Vitamin (MULTIVITAMIN WITH MINERALS) TABS tablet Take 1 tablet by mouth daily.    . Omega-3 Fatty Acids (EQL OMEGA 3 FISH OIL) 1000 MG CAPS Take 1 tablet by mouth 2 (two) times daily.    Marland Kitchen omeprazole (PRILOSEC) 40 MG capsule Take 40 mg by mouth 2 (two) times daily.     Marland Kitchen tretinoin (RETIN-A) 0.05 % cream Apply 1 application topically at bedtime.    . vitamin B-12 (CYANOCOBALAMIN) 1000 MCG tablet Take 1,000 mcg by mouth daily.     .  Vitamin D, Cholecalciferol, 1000 units CAPS Take 1,000 Units by mouth daily.    Marland Kitchen zolpidem (AMBIEN) 10 MG tablet Take 1  Tab at night for sleep     No current facility-administered medications for this visit.      Past Medical History:  Diagnosis Date  . Bipolar 1 disorder (HCC)   . GERD (gastroesophageal reflux disease)   . Hyperlipidemia   . Hypertension   . Hypothyroidism   . Raynaud disease   . Scleroderma [facility])     Past Surgical History:  Procedure Laterality Date  . CARDIAC CATHETERIZATION N/A 04/24/2015   Procedure: Right/Left Heart Cath and Coronary Angiography;  Surgeon: Tonny Bollman, MD;  Location: [facility] INVASIVE CV LAB;  Service: Cardiovascular;  Laterality: N/A;  . sympathomyectomies  2000   Bilateral wrists    Social History   Social History  . Marital status: Married    Spouse name: N/A  . Number of children: N/A  . Years of education: N/A   Occupational History  . Not on file.   Social History Main Topics  . Smoking status: Never Smoker  . Smokeless tobacco: Never Used  . Alcohol use No  . Drug use: No  . Sexual activity: Not on file   Other Topics Concern  . Not on file   Social History Narrative  . No narrative on file  Family History  Problem Relation Age of Onset  . Adopted: Yes  . Heart attack Sister 38    deceased    ROS: fatigue but no fevers or chills, productive cough, hemoptysis, dysphasia, odynophagia, melena, hematochezia, dysuria, hematuria, rash, seizure activity, orthopnea, PND, pedal edema, claudication. Remaining systems are negative.  Physical Exam: Well-developed well-nourished in no acute distress.  Skin is warm and dry.  HEENT is normal.  Neck is supple.  Chest is clear to auscultation with normal expansion.  Cardiovascular exam is regular rate and rhythm. 2/6 systolic murmur left sternal border radiating to the carotids. S2 is mildly diminished.  Abdominal exam nontender or distended. No masses  palpated. Extremities show no edema. neuro grossly intact  A/P  1 aortic stenosis-follow-up echocardiogram November 2018.Given the timing of aortic stenosis the likely etiology is bicuspid aortic valve. Note approximately 20 minutes spent reviewing old records prior to patient arrival.   2 hyperlipidemia-continue statin.  3 nonobstructive coronary disease-continue aspirin and statin.  4 scleroderma  Olga Millers, MD

## 2016-02-26 ENCOUNTER — Ambulatory Visit (INDEPENDENT_AMBULATORY_CARE_PROVIDER_SITE_OTHER): Payer: Medicare HMO | Admitting: Cardiology

## 2016-02-26 ENCOUNTER — Encounter: Payer: Self-pay | Admitting: Cardiology

## 2016-02-26 VITALS — BP 117/79 | HR 72 | Ht 63.0 in | Wt 183.0 lb

## 2016-02-26 DIAGNOSIS — E7800 Pure hypercholesterolemia, unspecified: Secondary | ICD-10-CM | POA: Diagnosis not present

## 2016-02-26 DIAGNOSIS — R0789 Other chest pain: Secondary | ICD-10-CM | POA: Diagnosis not present

## 2016-02-26 DIAGNOSIS — I350 Nonrheumatic aortic (valve) stenosis: Secondary | ICD-10-CM | POA: Diagnosis not present

## 2016-02-26 NOTE — Patient Instructions (Signed)
Your physician wants you to follow-up in: 6 MONTHS WITH DR CRENSHAW You will receive a reminder letter in the mail two months in advance. If you don't receive a letter, please call our office to schedule the follow-up appointment.   If you need a refill on your cardiac medications before your next appointment, please call your pharmacy.  

## 2016-09-09 NOTE — Progress Notes (Signed)
HPI: Follow-up aortic stenosis. Cardiac catheterization February 2017 showed a 25% proximal LAD lesion. Moderate aortic stenosis with mean gradient 21 mmHg. Echocardiogram November 2017 showed normal LV systolic function, grade 1 diastolic dysfunction, moderate aortic stenosis with mean gradient 25 mmHg, mild aortic insufficiency. Since last seen, she has some dyspnea on exertion unchanged. She occasionally feels a vague chest discomfort after exercising but does not have exertional chest pain. There is no presyncope or syncope.  Current Outpatient Prescriptions  Medication Sig Dispense Refill  . albuterol (PROVENTIL HFA;VENTOLIN HFA) 108 (90 Base) MCG/ACT inhaler Inhale into the lungs.    Marland Kitchen amLODipine (NORVASC) 5 MG tablet Take 5 mg by mouth daily.     Marland Kitchen atorvastatin (LIPITOR) 40 MG tablet Take 40 mg by mouth daily.    Marland Kitchen doxycycline (VIBRAMYCIN) 100 MG capsule Take 100 mg by mouth daily.     . DULoxetine (CYMBALTA) 60 MG capsule 1 cap by mouth daily    . levothyroxine (SYNTHROID, LEVOTHROID) 112 MCG tablet Take 112 mcg by mouth daily before breakfast.    . Melatonin 5 MG CAPS Take 5 mg by mouth at bedtime as needed (for sleep).    . metoprolol succinate (TOPROL-XL) 50 MG 24 hr tablet Take 50 mg by mouth daily.     . Multiple Vitamin (MULTIVITAMIN WITH MINERALS) TABS tablet Take 1 tablet by mouth daily.    . Omega-3 Fatty Acids (EQL OMEGA 3 FISH OIL) 1000 MG CAPS Take 1 tablet by mouth 2 (two) times daily.    Marland Kitchen omeprazole (PRILOSEC) 40 MG capsule Take 40 mg by mouth 2 (two) times daily.     Marland Kitchen tretinoin (RETIN-A) 0.05 % cream Apply 1 application topically at bedtime.    . vitamin B-12 (CYANOCOBALAMIN) 1000 MCG tablet Take 1,000 mcg by mouth daily.     . Vitamin D, Cholecalciferol, 1000 units CAPS Take 1,000 Units by mouth daily.    Marland Kitchen zolpidem (AMBIEN) 10 MG tablet Take 1  Tab at night for sleep     No current facility-administered medications for this visit.      Past Medical  History:  Diagnosis Date  . Bipolar 1 disorder (HCC)   . GERD (gastroesophageal reflux disease)   . Hyperlipidemia   . Hypertension   . Hypothyroidism   . Raynaud disease   . Scleroderma [facility])     Past Surgical History:  Procedure Laterality Date  . CARDIAC CATHETERIZATION N/A 04/24/2015   Procedure: Right/Left Heart Cath and Coronary Angiography;  Surgeon: Tonny Bollman, MD;  Location: [facility] INVASIVE CV LAB;  Service: Cardiovascular;  Laterality: N/A;  . sympathomyectomies  2000   Bilateral wrists    Social History   Social History  . Marital status: Married    Spouse name: N/A  . Number of children: N/A  . Years of education: N/A   Occupational History  . Not on file.   Social History Main Topics  . Smoking status: Never Smoker  . Smokeless tobacco: Never Used  . Alcohol use No  . Drug use: No  . Sexual activity: Not on file   Other Topics Concern  . Not on file   Social History Narrative  . No narrative on file    Family History  Problem Relation Age of Onset  . Adopted: Yes  . Heart attack Sister 57       deceased    ROS: no fevers or chills, productive cough, hemoptysis, dysphasia, odynophagia, melena, hematochezia, dysuria, hematuria, rash,  seizure activity, orthopnea, PND, pedal edema, claudication. Remaining systems are negative.  Physical Exam: Well-developed well-nourished in no acute distress.  Skin is warm and dry.  HEENT is normal.  Neck is supple.  Chest is clear to auscultation with normal expansion.  Cardiovascular exam is regular rate and rhythm. 3/6 systolic murmur; S2 mildly diminished Abdominal exam nontender or distended. No masses palpated. Extremities show no edema. neuro grossly intact  ECG- Sinus rhythm at a rate of 72. No ST changes. personally reviewed  A/P  1 Aortic stenosis-we will plan to repeat her echocardiogram November 2018. I have explained she will require aortic valve replacement in the future. Her symptoms are  unchanged compared to previous. Given her age likely etiology of aortic stenosis would be bicuspid aortic valve.  2- hyperlipidemia-continue statin.  3 nonobstructive coronary disease-continue aspirin and statin.  4 history of scleroderma   Olga Millers, MD

## 2016-09-16 ENCOUNTER — Ambulatory Visit (INDEPENDENT_AMBULATORY_CARE_PROVIDER_SITE_OTHER): Payer: Medicare HMO | Admitting: Cardiology

## 2016-09-16 ENCOUNTER — Encounter: Payer: Self-pay | Admitting: Cardiology

## 2016-09-16 VITALS — BP 144/84 | HR 72 | Ht 63.0 in | Wt 181.1 lb

## 2016-09-16 DIAGNOSIS — E7800 Pure hypercholesterolemia, unspecified: Secondary | ICD-10-CM

## 2016-09-16 DIAGNOSIS — I350 Nonrheumatic aortic (valve) stenosis: Secondary | ICD-10-CM

## 2016-09-16 NOTE — Patient Instructions (Signed)
Medication Instructions:   NO CHANGE  Testing/Procedures:  Your physician has requested that you have an echocardiogram. Echocardiography is a painless test that uses sound waves to create images of your heart. It provides your doctor with information about the size and shape of your heart and how well your heart's chambers and valves are working. This procedure takes approximately one hour. There are no restrictions for this procedure.NOVEMBER    Follow-Up:  Your physician recommends that you schedule a follow-up appointment in: DECEMBER   If you need a refill on your cardiac medications before your next appointment, please call your pharmacy.

## 2016-10-06 ENCOUNTER — Other Ambulatory Visit: Payer: Self-pay | Admitting: Gastroenterology

## 2016-10-06 DIAGNOSIS — R1310 Dysphagia, unspecified: Secondary | ICD-10-CM

## 2016-10-06 DIAGNOSIS — M349 Systemic sclerosis, unspecified: Secondary | ICD-10-CM

## 2016-10-08 ENCOUNTER — Ambulatory Visit
Admission: RE | Admit: 2016-10-08 | Discharge: 2016-10-08 | Disposition: A | Discharge status: Home / Self Care | Payer: Medicare HMO | Source: Ambulatory Visit | Attending: Gastroenterology | Admitting: Gastroenterology

## 2016-10-08 DIAGNOSIS — M349 Systemic sclerosis, unspecified: Secondary | ICD-10-CM

## 2016-10-08 DIAGNOSIS — R1310 Dysphagia, unspecified: Secondary | ICD-10-CM

## 2017-01-27 ENCOUNTER — Ambulatory Visit (HOSPITAL_BASED_OUTPATIENT_CLINIC_OR_DEPARTMENT_OTHER)
Admission: RE | Admit: 2017-01-27 | Discharge: 2017-01-27 | Disposition: A | Discharge status: Home / Self Care | Payer: Medicare HMO | Source: Ambulatory Visit | Attending: Cardiology | Admitting: Cardiology

## 2017-01-27 DIAGNOSIS — E669 Obesity, unspecified: Secondary | ICD-10-CM | POA: Insufficient documentation

## 2017-01-27 DIAGNOSIS — I10 Essential (primary) hypertension: Secondary | ICD-10-CM | POA: Insufficient documentation

## 2017-01-27 DIAGNOSIS — I350 Nonrheumatic aortic (valve) stenosis: Secondary | ICD-10-CM | POA: Diagnosis not present

## 2017-01-27 DIAGNOSIS — I080 Rheumatic disorders of both mitral and aortic valves: Secondary | ICD-10-CM | POA: Diagnosis not present

## 2017-01-27 NOTE — Progress Notes (Signed)
  Echocardiogram 2D Echocardiogram has been performed.  Natasha Taylor 01/27/2017, 2:50 PM

## 2017-01-28 ENCOUNTER — Telehealth: Payer: Self-pay | Admitting: *Deleted

## 2017-01-28 DIAGNOSIS — I712 Thoracic aortic aneurysm, without rupture, unspecified: Secondary | ICD-10-CM

## 2017-01-28 NOTE — Telephone Encounter (Addendum)
-----   Message from Lewayne Bunting, MD sent at 01/28/2017  7:10 AM EST ----- Aortic stenosis is moderate and unchanged Arrange CTA of thoracic aorta to exclude aneurysm Olga Millers   Spoke with pt, CTA scheduled for 02-05-17 @ 3:30 pm.

## 2017-02-05 ENCOUNTER — Ambulatory Visit (INDEPENDENT_AMBULATORY_CARE_PROVIDER_SITE_OTHER)
Admission: RE | Admit: 2017-02-05 | Discharge: 2017-02-05 | Disposition: A | Discharge status: Home / Self Care | Payer: Medicare HMO | Source: Ambulatory Visit | Attending: Cardiology | Admitting: Cardiology

## 2017-02-05 DIAGNOSIS — I712 Thoracic aortic aneurysm, without rupture, unspecified: Secondary | ICD-10-CM

## 2017-02-05 MED ORDER — IOPAMIDOL (ISOVUE-370) INJECTION 76%
100.0000 mL | Freq: Once | INTRAVENOUS | Status: AC | PRN
  Administered 2017-02-05: 100 mL via INTRAVENOUS

## 2017-02-09 ENCOUNTER — Telehealth: Payer: Self-pay

## 2017-02-09 NOTE — Telephone Encounter (Signed)
Left message of results for pt  

## 2017-02-09 NOTE — Telephone Encounter (Signed)
Copied from CRM [id number]. Topic: Quick Communication - Other Results >> Feb 09, 2017  9:47 AM Waymon Amato wrote: [facility] AMB CRM OTHER [id number] pt is looking for her ct scan results that was done Friday afternoon   Best number [phone]  >> Feb 09, 2017 10:02 AM Self, Dayton Scrape, CMA wrote: Cardiology ordered scan - results will go to cardiology.

## 2017-02-17 NOTE — Progress Notes (Signed)
HPI: Follow-up aortic stenosis. Cardiac catheterization February 2017 showed a 25% proximal LAD lesion. Moderate aortic stenosis with mean gradient 21 mmHg. Echocardiogram November 2017 showed normal LV systolic function, grade 1 diastolic dysfunction, moderate aortic stenosis with mean gradient 25 mmHg, mild aortic insufficiency. Last echocardiogram November 2018 showed normal LV function, grade 1 diastolic dysfunction, moderate to severe aortic stenosis with mean gradient 26 mmHg, mild aortic insufficiency and mild mitral regurgitation. CTA November 2018 showed calcification of the aortic valve. Since last seen, the patient has dyspnea with more extreme activities but not with routine activities. It is relieved with rest. It is not associated with chest pain. There is no orthopnea, PND or pedal edema. There is no syncope or palpitations. There is no exertional chest pain.   Current Outpatient Medications  Medication Sig Dispense Refill  . albuterol (PROVENTIL HFA;VENTOLIN HFA) 108 (90 Base) MCG/ACT inhaler Inhale into the lungs.    Marland Kitchen amLODipine (NORVASC) 5 MG tablet Take 5 mg by mouth daily.     Marland Kitchen atorvastatin (LIPITOR) 40 MG tablet Take 40 mg by mouth daily.    Marland Kitchen doxycycline (VIBRAMYCIN) 100 MG capsule Take 100 mg by mouth daily.     . DULoxetine (CYMBALTA) 60 MG capsule 1 cap by mouth daily    . LANSOPRAZOLE PO Take 30 mg by mouth 2 (two) times daily.    Marland Kitchen levothyroxine (SYNTHROID, LEVOTHROID) 112 MCG tablet Take 112 mcg by mouth daily before breakfast.    . Melatonin 5 MG CAPS Take 5 mg by mouth at bedtime as needed (for sleep).    . metoprolol succinate (TOPROL-XL) 50 MG 24 hr tablet Take 50 mg by mouth daily.     . Multiple Vitamin (MULTIVITAMIN WITH MINERALS) TABS tablet Take 1 tablet by mouth daily.    . Omega-3 Fatty Acids (EQL OMEGA 3 FISH OIL) 1000 MG CAPS Take 1 tablet by mouth 2 (two) times daily.    Marland Kitchen tretinoin (RETIN-A) 0.05 % cream Apply 1 application topically at bedtime.     . vitamin B-12 (CYANOCOBALAMIN) 1000 MCG tablet Take 1,000 mcg by mouth daily.     . Vitamin D, Cholecalciferol, 1000 units CAPS Take 1,000 Units by mouth daily.    Marland Kitchen zolpidem (AMBIEN) 10 MG tablet Take 1  Tab at night for sleep     No current facility-administered medications for this visit.      Past Medical History:  Diagnosis Date  . Bipolar 1 disorder (HCC)   . GERD (gastroesophageal reflux disease)   . Hyperlipidemia   . Hypertension   . Hypothyroidism   . Raynaud disease   . Scleroderma [facility])     Past Surgical History:  Procedure Laterality Date  . CARDIAC CATHETERIZATION N/A 04/24/2015   Procedure: Right/Left Heart Cath and Coronary Angiography;  Surgeon: Tonny Bollman, MD;  Location: [facility] INVASIVE CV LAB;  Service: Cardiovascular;  Laterality: N/A;  . sympathomyectomies  2000   Bilateral wrists    Social History   Socioeconomic History  . Marital status: Legally Separated    Spouse name: Not on file  . Number of children: Not on file  . Years of education: Not on file  . Highest education level: Not on file  Social Needs  . Financial resource strain: Not on file  . Food insecurity - worry: Not on file  . Food insecurity - inability: Not on file  . Transportation needs - medical: Not on file  . Transportation needs - non-medical: Not  on file  Occupational History  . Not on file  Tobacco Use  . Smoking status: Never Smoker  . Smokeless tobacco: Never Used  Substance and Sexual Activity  . Alcohol use: No    Alcohol/week: 0.0 oz  . Drug use: No  . Sexual activity: Not on file  Other Topics Concern  . Not on file  Social History Narrative  . Not on file    Family History  Adopted: Yes  Problem Relation Age of Onset  . Heart attack Sister 41       deceased    ROS: some problems related to scleroderma including arthralgias and cold hands but no fevers or chills, productive cough, hemoptysis, dysphasia, odynophagia, melena, hematochezia, dysuria,  hematuria, rash, seizure activity, orthopnea, PND, pedal edema, claudication. Remaining systems are negative.  Physical Exam: Well-developed well-nourished in no acute distress.  Skin is warm and dry.  HEENT is normal.  Neck is supple.  Chest is clear to auscultation with normal expansion.  Cardiovascular exam is regular rate and rhythm. 3/6 systolic murmur left sternal border. S2 is diminished. Abdominal exam nontender or distended. No masses palpated. Extremities show no edema. neuro grossly intact   A/P  1 aortic stenosis-echocardiogram shows moderate to severe aortic stenosis. I have again explained that she will likely require aortic valve replacement in the future given her young age. She understands the symptoms to be aware of including chest pain, dyspnea and syncope. Will repeat echocardiogram November 2019 or if symptoms change.  2 hyperlipidemia-continue statin.  3 history of nonobstructive coronary disease-continue aspirin and statin.  4 history of scleroderma.  5 hypertension-blood pressure is mildly elevated. However she states typically controlled. Continue present medications and follow.  Olga Millers, MD

## 2017-03-03 ENCOUNTER — Ambulatory Visit (INDEPENDENT_AMBULATORY_CARE_PROVIDER_SITE_OTHER): Payer: Medicare HMO | Admitting: Cardiology

## 2017-03-03 ENCOUNTER — Encounter: Payer: Self-pay | Admitting: Cardiology

## 2017-03-03 VITALS — BP 148/92 | HR 74 | Ht 63.0 in | Wt 181.0 lb

## 2017-03-03 DIAGNOSIS — I350 Nonrheumatic aortic (valve) stenosis: Secondary | ICD-10-CM | POA: Diagnosis not present

## 2017-03-03 DIAGNOSIS — I10 Essential (primary) hypertension: Secondary | ICD-10-CM

## 2017-03-03 DIAGNOSIS — E7800 Pure hypercholesterolemia, unspecified: Secondary | ICD-10-CM

## 2017-03-03 NOTE — Patient Instructions (Signed)
Medication Instructions: Your physician recommends that you continue on your current medications as directed. Please refer to the Current Medication list given to you today.   Labwork: None ordered  Procedures/Testing: None ordered  Follow-Up: Your physician wants you to follow-up in: 6 months with Dr. Jens Som.  You will receive a reminder letter in the mail two months in advance. If you don't receive a letter, please call our office to schedule the follow-up appointment.   Any Additional Special Instructions Will Be Listed Below (If Applicable).     If you need a refill on your cardiac medications before your next appointment, please call your pharmacy.

## 2017-09-07 NOTE — Progress Notes (Signed)
HPI: Follow-up aortic stenosis. Cardiac catheterization February 2017 showed a 25% proximal LAD lesion. Moderate aortic stenosis with mean gradient 21 mmHg. Last echocardiogram November 2018 showed normal LV function, grade 1 diastolic dysfunction, moderate to severe aortic stenosis with mean gradient 26 mmHg, mild aortic insufficiency and mild mitral regurgitation. CTA November 2018 showed calcification of the aortic valve. Since last seen, the patient denies any dyspnea on exertion, orthopnea, PND, pedal edema, palpitations, syncope or chest pain.   Current Outpatient Medications  Medication Sig Dispense Refill  . amLODipine (NORVASC) 5 MG tablet Take 5 mg by mouth daily.     Marland Kitchen aspirin EC 81 MG tablet Take 81 mg by mouth daily.    Marland Kitchen atorvastatin (LIPITOR) 20 MG tablet Take 20 mg by mouth daily.    . cholestyramine (QUESTRAN) 4 g packet Take 4 g by mouth 2 (two) times daily.    Marland Kitchen doxycycline (VIBRAMYCIN) 100 MG capsule Take 100 mg by mouth as needed (acne break outs).     . DULoxetine (CYMBALTA) 60 MG capsule 1 cap by mouth daily    . LANSOPRAZOLE PO Take 30 mg by mouth 2 (two) times daily.    Marland Kitchen levothyroxine (SYNTHROID, LEVOTHROID) 112 MCG tablet Take 112 mcg by mouth daily before breakfast.    . Melatonin 5 MG CAPS Take 5 mg by mouth at bedtime as needed (for sleep).    . metoprolol succinate (TOPROL-XL) 50 MG 24 hr tablet Take 50 mg by mouth daily.     . Multiple Vitamin (MULTIVITAMIN WITH MINERALS) TABS tablet Take 1 tablet by mouth daily.    . Omega-3 Fatty Acids (EQL OMEGA 3 FISH OIL) 1000 MG CAPS Take 1 tablet by mouth 2 (two) times daily.    Marland Kitchen tretinoin (RETIN-A) 0.05 % cream Apply 1 application topically at bedtime.    . vitamin B-12 (CYANOCOBALAMIN) 1000 MCG tablet Take 1,000 mcg by mouth daily.     Marland Kitchen zolpidem (AMBIEN) 10 MG tablet Take 1  Tab at night for sleep     No current facility-administered medications for this visit.      Past Medical History:  Diagnosis Date    . Bipolar 1 disorder (HCC)   . GERD (gastroesophageal reflux disease)   . Hyperlipidemia   . Hypertension   . Hypothyroidism   . Raynaud disease   . Scleroderma [facility])     Past Surgical History:  Procedure Laterality Date  . CARDIAC CATHETERIZATION N/A 04/24/2015   Procedure: Right/Left Heart Cath and Coronary Angiography;  Surgeon: Tonny Bollman, MD;  Location: [facility] INVASIVE CV LAB;  Service: Cardiovascular;  Laterality: N/A;  . sympathomyectomies  2000   Bilateral wrists    Social History   Socioeconomic History  . Marital status: Legally Separated    Spouse name: Not on file  . Number of children: Not on file  . Years of education: Not on file  . Highest education level: Not on file  Occupational History  . Not on file  Social Needs  . Financial resource strain: Not on file  . Food insecurity:    Worry: Not on file    Inability: Not on file  . Transportation needs:    Medical: Not on file    Non-medical: Not on file  Tobacco Use  . Smoking status: Never Smoker  . Smokeless tobacco: Never Used  Substance and Sexual Activity  . Alcohol use: No    Alcohol/week: 0.0 oz  . Drug use: No  .  Sexual activity: Not on file  Lifestyle  . Physical activity:    Days per week: Not on file    Minutes per session: Not on file  . Stress: Not on file  Relationships  . Social connections:    Talks on phone: Not on file    Gets together: Not on file    Attends religious service: Not on file    Active member of club or organization: Not on file    Attends meetings of clubs or organizations: Not on file    Relationship status: Not on file  . Intimate partner violence:    Fear of current or ex partner: Not on file    Emotionally abused: Not on file    Physically abused: Not on file    Forced sexual activity: Not on file  Other Topics Concern  . Not on file  Social History Narrative  . Not on file    Family History  Adopted: Yes  Problem Relation Age of Onset  . Heart  attack Sister 67       deceased    ROS: no fevers or chills, productive cough, hemoptysis, dysphasia, odynophagia, melena, hematochezia, dysuria, hematuria, rash, seizure activity, orthopnea, PND, pedal edema, claudication. Remaining systems are negative.  Physical Exam: Well-developed well-nourished in no acute distress.  Skin is warm and dry.  HEENT is normal.  Neck is supple.  Chest is clear to auscultation with normal expansion.  Cardiovascular exam is regular rate and rhythm.  3/6 systolic murmur left sternal border.  S2 mildly diminished. Abdominal exam nontender or distended. No masses palpated. Extremities show no edema. neuro grossly intact  ECG-sinus rhythm at a rate of 77.  No ST changes.  Personally reviewed  A/P  1 aortic stenosis-patient remains asymptomatic and denies dyspnea, chest pain or syncope.  Plan to repeat echocardiogram November 2019.  She understands that she will likely require aortic valve replacement in the future and also the symptoms to be aware of including dyspnea, chest pain and syncope.  2 hypertension-blood pressure is elevated today.  I have asked her to follow this and we will increase amlodipine if needed.  3 nonobstructive coronary artery disease on previous catheterization-continue aspirin and statin.  4 hyperlipidemia-continue statin.  5 history of scleroderma.  Olga Millers, MD

## 2017-09-08 ENCOUNTER — Ambulatory Visit (INDEPENDENT_AMBULATORY_CARE_PROVIDER_SITE_OTHER): Payer: Medicare HMO | Admitting: Cardiology

## 2017-09-08 ENCOUNTER — Encounter: Payer: Self-pay | Admitting: Cardiology

## 2017-09-08 VITALS — BP 138/100 | HR 76 | Ht 63.0 in | Wt 183.0 lb

## 2017-09-08 DIAGNOSIS — E7800 Pure hypercholesterolemia, unspecified: Secondary | ICD-10-CM | POA: Diagnosis not present

## 2017-09-08 DIAGNOSIS — I350 Nonrheumatic aortic (valve) stenosis: Secondary | ICD-10-CM | POA: Diagnosis not present

## 2017-09-08 DIAGNOSIS — I10 Essential (primary) hypertension: Secondary | ICD-10-CM | POA: Diagnosis not present

## 2017-09-08 DIAGNOSIS — I2510 Atherosclerotic heart disease of native coronary artery without angina pectoris: Secondary | ICD-10-CM

## 2017-09-08 NOTE — Patient Instructions (Signed)
Your physician wants you to follow-up in: November 2019 You will receive a reminder letter in the mail two months in advance. If you don't receive a letter, please call our office to schedule the follow-up appointment.   If you need a refill on your cardiac medications before your next appointment, please call your pharmacy.

## 2017-09-13 ENCOUNTER — Telehealth: Payer: Self-pay | Admitting: Cardiology

## 2017-09-13 MED ORDER — AMLODIPINE BESYLATE 10 MG PO TABS
10.0000 mg | ORAL_TABLET | Freq: Every day | ORAL | 3 refills | Status: DC
Start: 2017-09-13 — End: 2018-11-29

## 2017-09-13 NOTE — Telephone Encounter (Signed)
Spoke with pt, Aware of dr crenshaw's recommendations. New script sent to the pharmacy  

## 2017-09-13 NOTE — Telephone Encounter (Signed)
New Message    Pt c/o BP issue:  1. What are your last 5 BP readings?  06/24 154/95   06/23  136/98   06/22  140/90, 144/99 198/33    06/21 129/88 2. Are you having any other symptoms (ex. Dizziness, headache, blurred vision, passed out)? headaches 3. What is your medication issue? None   Patient is calling because she was advised to provide her BP readings from the weekend because he may increase her medication.

## 2017-09-13 NOTE — Telephone Encounter (Signed)
Spoke with pt who states per Dr. Jens Som she was to keep a log of BP and call back today with the recording to determine if amlodipine needs to be increased. BP are as followed;  6/21-129/88 6/22-140/90, 144/99, 148/83 6/23-136/98 6/24-154/95  Routing to [place] for recommendation.

## 2017-09-13 NOTE — Telephone Encounter (Signed)
Change amlodipine to 10 mg daily Brian Crenshaw  

## 2017-12-28 ENCOUNTER — Telehealth: Payer: Self-pay

## 2017-12-28 DIAGNOSIS — R42 Dizziness and giddiness: Secondary | ICD-10-CM

## 2017-12-28 NOTE — Telephone Encounter (Signed)
   [facility] Pre-operative Risk Assessment    Request for surgical clearance:  1. What type of surgery is being performed? Right Foot: Removal of deep implants right medial cuneiform, 1st MT and 2nd MT  2. When is this surgery scheduled? Pending   3. What type of clearance is required (medical clearance vs. Pharmacy clearance to hold med vs. Both)? both  4. Are there any medications that need to be held prior to surgery and how long? None listed; pt on aspirin  5. Practice name and name of physician performing surgery? [facility], Dr. Doran Durand  6. What is your office phone number [phone]    7.   What is your office fax number [phone] attn [place]ale Chard  8.   Anesthesia type (None, local, MAC, general) ? Choice (local or block)    Joaquim Lai 12/28/2017, 4:08 PM  _________________________________________________________________   (provider comments below)

## 2017-12-31 NOTE — Telephone Encounter (Signed)
   Primary Cardiologist:Brian Jens Som, MD  Chart reviewed as part of pre-operative protocol coverage. Because of MARCENA DIAS past medical history and time since last visit, he/she will require a follow-up visit in order to better assess preoperative cardiovascular risk.  Pre-op covering staff: - Please schedule Echo for pt per Dr. Jens Som with recent episodes of dizziness and aortic stenois and call patient to inform them. - Please contact requesting surgeon's office via preferred method (i.e, phone, fax) to inform them of need for appointment prior to surgery.  Nada Boozer, NP  12/31/2017, 2:41 PM

## 2017-12-31 NOTE — Telephone Encounter (Signed)
Spoke with pt re: surgical clearance.  Due to the symptoms pt reported when she was contacted re: surgical clearance, Dr. Jens Som wanted to order a ECHO prior to clearing pt. Pt has been made aware it has been ordered and someone will contact her to get thi arranged. Pt thanked me for the call.

## 2017-12-31 NOTE — Telephone Encounter (Signed)
Repeat echo prior to surgery; ok to hold asa Natasha Taylor

## 2017-12-31 NOTE — Telephone Encounter (Signed)
Dr. Jens Som I talked to Ms Marich, plans for foot surgery either local or block.   She has had 2 episodes of dizziness.  With low risk surgery are you ok with clearance or should we repeat echo ?  Just let me know. Thanks.  And would it be ok to stop ASA for procedure?

## 2018-01-03 ENCOUNTER — Other Ambulatory Visit: Payer: Self-pay

## 2018-01-03 ENCOUNTER — Ambulatory Visit (HOSPITAL_COMMUNITY): Payer: Medicare HMO | Attending: Cardiology

## 2018-01-03 DIAGNOSIS — R42 Dizziness and giddiness: Secondary | ICD-10-CM | POA: Diagnosis not present

## 2018-01-13 ENCOUNTER — Telehealth: Payer: Self-pay

## 2018-01-13 NOTE — Telephone Encounter (Signed)
Pt's echo reviewed by Dr Thea Alken for surgery from cardiac standpoint. OK to hold ASA if needed.  Corine Shelter PA-C 01/13/2018 3:33 PM

## 2018-01-13 NOTE — Telephone Encounter (Signed)
   [facility] Pre-operative Risk Assessment    Request for surgical clearance:  1. What type of surgery is being performed? Right foot: removal of deep implants right medial cuneiform, 1st MT and 2nd MT   2. When is this surgery scheduled? TBD   3. What type of clearance is required (medical clearance vs. Pharmacy clearance to hold med vs. Both)? medical  4. Are there any medications that need to be held prior to surgery and how long? Not specified, patient only on ASA 81 mg q daily  5. Practice name and name of physician performing surgery? Wylene Simmer, MD   6. What is your office phone number [phone]    7.   What is your office fax number [phone]  8.   Anesthesia type (None, local, MAC, general) ? Local or block   Natasha Taylor 01/13/2018, 11:00 AM  _________________________________________________________________   (provider comments below)

## 2018-01-14 ENCOUNTER — Other Ambulatory Visit (HOSPITAL_COMMUNITY): Payer: Self-pay | Admitting: Orthopedic Surgery

## 2018-01-14 NOTE — Progress Notes (Signed)
Patient's chart and cardiac studies and notes reviewed with Dr Tacy Dura. Pt has a hx of mod to severe AS. Per Epic note by Nada Boozer NP at Dr Ludwig Clarks office, pt has had 2 recent episodes of dizziness. An ECHO was done 01-03-18 and continued to show mod to severe AS with EF 55-60%. Dr Tacy Dura states pt will be better served being done at [facility] due to AS. Tresa Endo at Dr [facility] office notified.

## 2018-01-19 ENCOUNTER — Other Ambulatory Visit: Payer: Self-pay

## 2018-01-19 ENCOUNTER — Encounter (HOSPITAL_COMMUNITY): Payer: Self-pay | Admitting: *Deleted

## 2018-01-19 NOTE — Progress Notes (Signed)
Left voice message with Tresa Endo, Surgical Coordinator, for Dr. Victorino Dike to see if NPO status could be modified for pt ( sx at 1715).

## 2018-01-19 NOTE — Progress Notes (Signed)
Pt denies SOB and chest pain. Pt stated that she is under the care of Dr. Jens Som, Cardiology. Pt denies having a chest x ray within the last year. Pt denies recent labs. Pt state that last dose of Aspirin was " Sunday " as instructed. Pt made aware to stop taking vitamins, fish oil, Melatonin and herbal medications. Do not take any NSAIDs ie: Ibuprofen, Advil, Naproxen (Aleve), Motrin, BC and Goody Powder or any medication containing Aspirin. Pt verbalized understanding of all pre-op instructions. Anesthesia made aware of pt history and clearance note in Epic.

## 2018-01-20 ENCOUNTER — Encounter (HOSPITAL_COMMUNITY): Payer: Self-pay

## 2018-01-20 ENCOUNTER — Ambulatory Visit (HOSPITAL_COMMUNITY): Payer: Medicare HMO | Admitting: Physician Assistant

## 2018-01-20 ENCOUNTER — Ambulatory Visit (HOSPITAL_COMMUNITY)
Admission: RE | Admit: 2018-01-20 | Discharge: 2018-01-20 | Disposition: A | Discharge status: Home / Self Care | Payer: Medicare HMO | Source: Ambulatory Visit | Attending: Orthopedic Surgery | Admitting: Orthopedic Surgery

## 2018-01-20 ENCOUNTER — Encounter (HOSPITAL_COMMUNITY)
Admission: RE | Disposition: A | Discharge status: Home / Self Care | Payer: Self-pay | Source: Ambulatory Visit | Attending: Orthopedic Surgery

## 2018-01-20 DIAGNOSIS — Z79899 Other long term (current) drug therapy: Secondary | ICD-10-CM | POA: Insufficient documentation

## 2018-01-20 DIAGNOSIS — J45909 Unspecified asthma, uncomplicated: Secondary | ICD-10-CM | POA: Diagnosis not present

## 2018-01-20 DIAGNOSIS — T85848D Pain due to other internal prosthetic devices, implants and grafts, subsequent encounter: Secondary | ICD-10-CM

## 2018-01-20 DIAGNOSIS — Z79890 Hormone replacement therapy (postmenopausal): Secondary | ICD-10-CM | POA: Diagnosis not present

## 2018-01-20 DIAGNOSIS — I10 Essential (primary) hypertension: Secondary | ICD-10-CM | POA: Diagnosis not present

## 2018-01-20 DIAGNOSIS — Z888 Allergy status to other drugs, medicaments and biological substances status: Secondary | ICD-10-CM | POA: Insufficient documentation

## 2018-01-20 DIAGNOSIS — G4730 Sleep apnea, unspecified: Secondary | ICD-10-CM | POA: Diagnosis not present

## 2018-01-20 DIAGNOSIS — T85848A Pain due to other internal prosthetic devices, implants and grafts, initial encounter: Secondary | ICD-10-CM | POA: Diagnosis not present

## 2018-01-20 DIAGNOSIS — E039 Hypothyroidism, unspecified: Secondary | ICD-10-CM | POA: Diagnosis not present

## 2018-01-20 DIAGNOSIS — G4700 Insomnia, unspecified: Secondary | ICD-10-CM | POA: Diagnosis not present

## 2018-01-20 DIAGNOSIS — E785 Hyperlipidemia, unspecified: Secondary | ICD-10-CM | POA: Diagnosis not present

## 2018-01-20 DIAGNOSIS — I350 Nonrheumatic aortic (valve) stenosis: Secondary | ICD-10-CM | POA: Diagnosis not present

## 2018-01-20 DIAGNOSIS — K219 Gastro-esophageal reflux disease without esophagitis: Secondary | ICD-10-CM | POA: Diagnosis not present

## 2018-01-20 DIAGNOSIS — M349 Systemic sclerosis, unspecified: Secondary | ICD-10-CM | POA: Diagnosis not present

## 2018-01-20 DIAGNOSIS — I7300 Raynaud's syndrome without gangrene: Secondary | ICD-10-CM | POA: Insufficient documentation

## 2018-01-20 DIAGNOSIS — Z8249 Family history of ischemic heart disease and other diseases of the circulatory system: Secondary | ICD-10-CM | POA: Diagnosis not present

## 2018-01-20 DIAGNOSIS — X58XXXA Exposure to other specified factors, initial encounter: Secondary | ICD-10-CM | POA: Insufficient documentation

## 2018-01-20 DIAGNOSIS — Z885 Allergy status to narcotic agent status: Secondary | ICD-10-CM | POA: Diagnosis not present

## 2018-01-20 DIAGNOSIS — Z7982 Long term (current) use of aspirin: Secondary | ICD-10-CM | POA: Insufficient documentation

## 2018-01-20 DIAGNOSIS — F319 Bipolar disorder, unspecified: Secondary | ICD-10-CM | POA: Diagnosis not present

## 2018-01-20 HISTORY — DX: Personal history of other diseases of the digestive system: Z87.19

## 2018-01-20 HISTORY — DX: Sleep apnea, unspecified: G47.30

## 2018-01-20 HISTORY — DX: Insomnia, unspecified: G47.00

## 2018-01-20 HISTORY — DX: Headache: R51

## 2018-01-20 HISTORY — DX: Nonrheumatic aortic (valve) stenosis: I35.0

## 2018-01-20 HISTORY — DX: Pain due to internal orthopedic prosthetic devices, implants and grafts, initial encounter: T84.84XA

## 2018-01-20 HISTORY — DX: Presence of spectacles and contact lenses: Z97.3

## 2018-01-20 HISTORY — PX: HARDWARE REMOVAL: SHX979

## 2018-01-20 HISTORY — DX: Personal history of other venous thrombosis and embolism: Z86.718

## 2018-01-20 HISTORY — DX: Unspecified asthma, uncomplicated: J45.909

## 2018-01-20 HISTORY — DX: Headache, unspecified: R51.9

## 2018-01-20 HISTORY — DX: Cardiac murmur, unspecified: R01.1

## 2018-01-20 HISTORY — DX: Presence of dental prosthetic device (complete) (partial): Z97.2

## 2018-01-20 LAB — BASIC METABOLIC PANEL
Anion gap: 9 (ref 5–15)
BUN: 18 mg/dL (ref 6–20)
CHLORIDE: 111 mmol/L (ref 98–111)
CO2: 21 mmol/L — AB (ref 22–32)
CREATININE: 0.91 mg/dL (ref 0.44–1.00)
Calcium: 10.3 mg/dL (ref 8.9–10.3)
GFR calc Af Amer: >60 mL/min (ref >60)
GFR calc non Af Amer: >60 mL/min (ref >60)
GLUCOSE: 81 mg/dL (ref 70–99)
Potassium: 3.5 mmol/L (ref 3.5–5.1)
Sodium: 141 mmol/L (ref 135–145)

## 2018-01-20 LAB — CBC
HEMATOCRIT: 45.5 % (ref 36.0–46.0)
Hemoglobin: 14.5 g/dL (ref 12.0–15.0)
MCH: 29.4 pg (ref 26.0–34.0)
MCHC: 31.9 g/dL (ref 30.0–36.0)
MCV: 92.3 fL (ref 80.0–100.0)
Platelets: 245 10*3/uL (ref 150–400)
RBC: 4.93 MIL/uL (ref 3.87–5.11)
RDW: 13.4 % (ref 11.5–15.5)
WBC: 6.4 10*3/uL (ref 4.0–10.5)
nRBC: 0 % (ref 0.0–0.2)

## 2018-01-20 SURGERY — REMOVAL, HARDWARE
Anesthesia: Monitor Anesthesia Care | Site: Foot | Laterality: Right

## 2018-01-20 MED ORDER — FENTANYL CITRATE (PF) 100 MCG/2ML IJ SOLN
100.0000 ug | Freq: Once | INTRAMUSCULAR | Status: AC
  Administered 2018-01-20: 100 ug via INTRAVENOUS

## 2018-01-20 MED ORDER — FENTANYL CITRATE (PF) 100 MCG/2ML IJ SOLN: 25.0000 ug | INTRAMUSCULAR | Status: DC | PRN

## 2018-01-20 MED ORDER — CHLORHEXIDINE GLUCONATE 4 % EX LIQD: 60.0000 mL | Freq: Once | CUTANEOUS | Status: DC

## 2018-01-20 MED ORDER — TRAMADOL HCL 50 MG PO TABS: 50.0000 mg | ORAL_TABLET | Freq: Four times a day (QID) | ORAL | 0 refills | Status: AC | PRN

## 2018-01-20 MED ORDER — LACTATED RINGERS IV SOLN
INTRAVENOUS | Status: DC
  Administered 2018-01-20: 12:00:00 via INTRAVENOUS

## 2018-01-20 MED ORDER — OXYCODONE HCL 5 MG/5ML PO SOLN: 5.0000 mg | Freq: Once | ORAL | Status: DC | PRN

## 2018-01-20 MED ORDER — SODIUM CHLORIDE 0.9 % IV SOLN: INTRAVENOUS | Status: DC

## 2018-01-20 MED ORDER — SUCCINYLCHOLINE CHLORIDE 200 MG/10ML IV SOSY
PREFILLED_SYRINGE | INTRAVENOUS | Status: AC
  Filled 2018-01-20: qty 10

## 2018-01-20 MED ORDER — FENTANYL CITRATE (PF) 250 MCG/5ML IJ SOLN
INTRAMUSCULAR | Status: AC
  Filled 2018-01-20: qty 5

## 2018-01-20 MED ORDER — OXYCODONE HCL 5 MG PO TABS: 5.0000 mg | ORAL_TABLET | Freq: Once | ORAL | Status: DC | PRN

## 2018-01-20 MED ORDER — CEFAZOLIN SODIUM-DEXTROSE 2-4 GM/100ML-% IV SOLN
2.0000 g | INTRAVENOUS | Status: AC
  Administered 2018-01-20: 2 g via INTRAVENOUS

## 2018-01-20 MED ORDER — ONDANSETRON HCL 4 MG/2ML IJ SOLN
INTRAMUSCULAR | Status: AC
  Filled 2018-01-20: qty 2

## 2018-01-20 MED ORDER — ROCURONIUM BROMIDE 50 MG/5ML IV SOSY
PREFILLED_SYRINGE | INTRAVENOUS | Status: AC
  Filled 2018-01-20: qty 5

## 2018-01-20 MED ORDER — 0.9 % SODIUM CHLORIDE (POUR BTL) OPTIME
TOPICAL | Status: DC | PRN
  Administered 2018-01-20: 1000 mL

## 2018-01-20 MED ORDER — ROPIVACAINE HCL 7.5 MG/ML IJ SOLN
INTRAMUSCULAR | Status: DC | PRN
  Administered 2018-01-20 (×4): 5 mL via PERINEURAL

## 2018-01-20 MED ORDER — CEFAZOLIN SODIUM-DEXTROSE 2-4 GM/100ML-% IV SOLN
INTRAVENOUS | Status: AC
  Filled 2018-01-20: qty 100

## 2018-01-20 MED ORDER — DEXAMETHASONE SODIUM PHOSPHATE 10 MG/ML IJ SOLN
INTRAMUSCULAR | Status: AC
  Filled 2018-01-20: qty 1

## 2018-01-20 MED ORDER — EPHEDRINE 5 MG/ML INJ
INTRAVENOUS | Status: AC
  Filled 2018-01-20: qty 10

## 2018-01-20 MED ORDER — FENTANYL CITRATE (PF) 100 MCG/2ML IJ SOLN
INTRAMUSCULAR | Status: DC | PRN
  Administered 2018-01-20: 25 ug via INTRAVENOUS
  Administered 2018-01-20: 50 ug via INTRAVENOUS

## 2018-01-20 MED ORDER — PROPOFOL 10 MG/ML IV BOLUS
INTRAVENOUS | Status: AC
  Filled 2018-01-20: qty 20

## 2018-01-20 MED ORDER — MIDAZOLAM HCL 2 MG/2ML IJ SOLN
INTRAMUSCULAR | Status: AC
  Administered 2018-01-20: 2 mg via INTRAVENOUS
  Filled 2018-01-20: qty 2

## 2018-01-20 MED ORDER — PROPOFOL 500 MG/50ML IV EMUL
INTRAVENOUS | Status: DC | PRN
  Administered 2018-01-20: 75 ug/kg/min via INTRAVENOUS

## 2018-01-20 MED ORDER — BACITRACIN ZINC 500 UNIT/GM EX OINT
TOPICAL_OINTMENT | CUTANEOUS | Status: AC
  Filled 2018-01-20: qty 28.35

## 2018-01-20 MED ORDER — BUPIVACAINE-EPINEPHRINE (PF) 0.25% -1:200000 IJ SOLN
INTRAMUSCULAR | Status: DC | PRN
  Administered 2018-01-20: 3 mL

## 2018-01-20 MED ORDER — ONDANSETRON HCL 4 MG/2ML IJ SOLN
INTRAMUSCULAR | Status: DC | PRN
  Administered 2018-01-20: 4 mg via INTRAVENOUS

## 2018-01-20 MED ORDER — FENTANYL CITRATE (PF) 100 MCG/2ML IJ SOLN
INTRAMUSCULAR | Status: AC
  Administered 2018-01-20: 100 ug via INTRAVENOUS
  Filled 2018-01-20: qty 2

## 2018-01-20 MED ORDER — BUPIVACAINE-EPINEPHRINE (PF) 0.25% -1:200000 IJ SOLN
INTRAMUSCULAR | Status: AC
  Filled 2018-01-20: qty 30

## 2018-01-20 MED ORDER — PHENYLEPHRINE 40 MCG/ML (10ML) SYRINGE FOR IV PUSH (FOR BLOOD PRESSURE SUPPORT)
PREFILLED_SYRINGE | INTRAVENOUS | Status: AC
  Filled 2018-01-20: qty 10

## 2018-01-20 MED ORDER — PROMETHAZINE HCL 25 MG/ML IJ SOLN: 6.2500 mg | INTRAMUSCULAR | Status: DC | PRN

## 2018-01-20 MED ORDER — MIDAZOLAM HCL 2 MG/2ML IJ SOLN
2.0000 mg | Freq: Once | INTRAMUSCULAR | Status: AC
  Administered 2018-01-20: 2 mg via INTRAVENOUS

## 2018-01-20 MED ORDER — LIDOCAINE 2% (20 MG/ML) 5 ML SYRINGE
INTRAMUSCULAR | Status: AC
  Filled 2018-01-20: qty 5

## 2018-01-20 SURGICAL SUPPLY — 59 items
BANDAGE ACE 4X5 VEL STRL LF (GAUZE/BANDAGES/DRESSINGS) ×3 IMPLANT
BANDAGE ESMARK 6X9 LF (GAUZE/BANDAGES/DRESSINGS) IMPLANT
BLADE SURG 10 STRL SS (BLADE) IMPLANT
BNDG COHESIVE 4X5 TAN STRL (GAUZE/BANDAGES/DRESSINGS) IMPLANT
BNDG COHESIVE 6X5 TAN STRL LF (GAUZE/BANDAGES/DRESSINGS) IMPLANT
BNDG ESMARK 4X9 LF (GAUZE/BANDAGES/DRESSINGS) ×3 IMPLANT
BNDG ESMARK 6X9 LF (GAUZE/BANDAGES/DRESSINGS)
CANISTER SUCT 3000ML PPV (MISCELLANEOUS) ×3 IMPLANT
CHLORAPREP W/TINT 26ML (MISCELLANEOUS) ×3 IMPLANT
CONT SPEC 4OZ CLIKSEAL STRL BL (MISCELLANEOUS) ×3 IMPLANT
COVER SURGICAL LIGHT HANDLE (MISCELLANEOUS) ×3 IMPLANT
COVER WAND RF STERILE (DRAPES) ×3 IMPLANT
CUFF TOURNIQUET SINGLE 34IN LL (TOURNIQUET CUFF) IMPLANT
CUFF TOURNIQUET SINGLE 44IN (TOURNIQUET CUFF) IMPLANT
DECANTER SPIKE VIAL GLASS SM (MISCELLANEOUS) ×3 IMPLANT
DRAPE C-ARM 42X72 X-RAY (DRAPES) IMPLANT
DRAPE C-ARM MINI 42X72 WSTRAPS (DRAPES) ×3 IMPLANT
DRAPE U-SHAPE 47X51 STRL (DRAPES) ×3 IMPLANT
DRSG ADAPTIC 3X8 NADH LF (GAUZE/BANDAGES/DRESSINGS) IMPLANT
DRSG MEPITEL 3X4 ME34 (GAUZE/BANDAGES/DRESSINGS) ×3 IMPLANT
DRSG PAD ABDOMINAL 8X10 ST (GAUZE/BANDAGES/DRESSINGS) IMPLANT
ELECT REM PT RETURN 9FT ADLT (ELECTROSURGICAL) ×3
ELECTRODE REM PT RTRN 9FT ADLT (ELECTROSURGICAL) ×1 IMPLANT
GAUZE SPONGE 4X4 12PLY STRL (GAUZE/BANDAGES/DRESSINGS) IMPLANT
GAUZE SPONGE 4X4 12PLY STRL LF (GAUZE/BANDAGES/DRESSINGS) ×3 IMPLANT
GLOVE BIO SURGEON STRL SZ8 (GLOVE) ×3 IMPLANT
GLOVE BIOGEL PI IND STRL 8 (GLOVE) ×2 IMPLANT
GLOVE BIOGEL PI INDICATOR 8 (GLOVE) ×4
GLOVE ECLIPSE 8.0 STRL XLNG CF (GLOVE) ×3 IMPLANT
GOWN STRL REUS W/ TWL LRG LVL3 (GOWN DISPOSABLE) ×1 IMPLANT
GOWN STRL REUS W/ TWL XL LVL3 (GOWN DISPOSABLE) ×2 IMPLANT
GOWN STRL REUS W/TWL LRG LVL3 (GOWN DISPOSABLE) ×2
GOWN STRL REUS W/TWL XL LVL3 (GOWN DISPOSABLE) ×4
KIT BASIN OR (CUSTOM PROCEDURE TRAY) ×3 IMPLANT
KIT TURNOVER KIT B (KITS) ×3 IMPLANT
NEEDLE 22X1 1/2 (OR ONLY) (NEEDLE) IMPLANT
NEEDLE HYPO 25GX1X1/2 BEV (NEEDLE) ×3 IMPLANT
NS IRRIG 1000ML POUR BTL (IV SOLUTION) ×3 IMPLANT
PACK ORTHO EXTREMITY (CUSTOM PROCEDURE TRAY) ×3 IMPLANT
PAD ARMBOARD 7.5X6 YLW CONV (MISCELLANEOUS) ×6 IMPLANT
PAD CAST 4YDX4 CTTN HI CHSV (CAST SUPPLIES) ×1 IMPLANT
PADDING CAST COTTON 4X4 STRL (CAST SUPPLIES) ×2
SOAP 2 % CHG 4 OZ (WOUND CARE) IMPLANT
SPONGE LAP 4X18 RFD (DISPOSABLE) IMPLANT
STAPLER VISISTAT 35W (STAPLE) IMPLANT
SUCTION FRAZIER HANDLE 10FR (MISCELLANEOUS)
SUCTION TUBE FRAZIER 10FR DISP (MISCELLANEOUS) IMPLANT
SUT ETHILON 3 0 PS 1 (SUTURE) ×3 IMPLANT
SUT PROLENE 3 0 PS 2 (SUTURE) ×3 IMPLANT
SUT VIC AB 2-0 CT1 27 (SUTURE) ×4
SUT VIC AB 2-0 CT1 TAPERPNT 27 (SUTURE) ×2 IMPLANT
SUT VIC AB 3-0 PS2 18 (SUTURE) ×2
SUT VIC AB 3-0 PS2 18XBRD (SUTURE) ×1 IMPLANT
SYR CONTROL 10ML LL (SYRINGE) ×3 IMPLANT
TOWEL OR 17X24 6PK STRL BLUE (TOWEL DISPOSABLE) ×3 IMPLANT
TOWEL OR 17X26 10 PK STRL BLUE (TOWEL DISPOSABLE) ×3 IMPLANT
TUBE CONNECTING 12'X1/4 (SUCTIONS) ×1
TUBE CONNECTING 12X1/4 (SUCTIONS) ×2 IMPLANT
WATER STERILE IRR 1000ML POUR (IV SOLUTION) ×3 IMPLANT

## 2018-01-20 NOTE — Progress Notes (Signed)
Orthopedic Tech Progress Note Patient Details:  Natasha Taylor 04/20/1960 [id number]  Ortho Devices Type of Ortho Device: Postop shoe/boot Ortho Device/Splint Location: rle Ortho Device/Splint Interventions: Application   Post Interventions Patient Tolerated: Well Instructions Provided: Care of device   Nikki Dom 01/20/2018, 3:22 PM

## 2018-01-20 NOTE — Anesthesia Postprocedure Evaluation (Signed)
Anesthesia Post Note  Patient: PHARRAH ROTTMAN  Procedure(s) Performed: Removal of deep implants right medial cuneiform, 1st metatarsal and 2nd metatarsal (Right Foot)     Patient location during evaluation: PACU Anesthesia Type: Regional and MAC Level of consciousness: awake Pain management: pain level controlled Vital Signs Assessment: post-procedure vital signs reviewed and stable Respiratory status: spontaneous breathing Cardiovascular status: stable Postop Assessment: no apparent nausea or vomiting Anesthetic complications: no    Last Vitals:  Vitals:   01/20/18 1500 01/20/18 1515  BP: 109/70 112/78  Pulse: 60 65  Resp: 19 19  Temp:    SpO2: 93% 95%    Last Pain:  Vitals:   01/20/18 1515  TempSrc:   PainSc: 0-No pain   Pain Goal: Patients Stated Pain Goal: 3 (01/20/18 1207)               Huston Foley

## 2018-01-20 NOTE — Op Note (Signed)
01/20/2018  2:42 PM  PATIENT:  Natasha Taylor  57 y.o. female  PRE-OPERATIVE DIAGNOSIS: Right foot painful hardware at the medial cuneiform, first and second metatarsals  POST-OPERATIVE DIAGNOSIS: Same  Procedure(s): 1.  Removal of deep implant from the right medial cuneiform 2.  Removal of deep implant from the right first metatarsal through a separate incision 3.  Removal of deep implant from the right second metatarsal through separate incision 4.  Right foot AP and lateral radiographs  SURGEON:  Wylene Simmer, MD  ASSISTANT: None  ANESTHESIA:   Local, MAC  EBL:  minimal   TOURNIQUET:  approx 15 min with an ankle esmarch      COMPLICATIONS:  None apparent  DISPOSITION:  Extubated, awake and stable to recovery.  INDICATION FOR PROCEDURE: The patient is a 57 year old female with past medical history significant for aortic stenosis.  She has painful hardware in her right foot after bunion correction in the remote past.  She has failed nonoperative treatment including activity modification, oral anti-inflammatories and shoewear modification.  She presents now for removal of the deep implants from the right foot medial cuneiform, first and second metatarsals.  The risks and benefits of the alternative treatment options have been discussed in detail.  The patient wishes to proceed with surgery and specifically understands risks of bleeding, infection, nerve damage, blood clots, need for additional surgery, amputation and death.  PROCEDURE IN DETAIL:  After pre operative consent was obtained, and the correct operative site was identified, the patient was brought to the operating room and placed supine on the OR table.  Anesthesia was administered.  Pre-operative antibiotics were administered.  A surgical timeout was taken.  The right lower extremity was prepped and draped in standard sterile fashion.  The foot was exsanguinated and a 4 inch Esmarch tourniquet wrapped around the ankle.   Local anesthetic was infiltrated into the subcutaneous tissues at the operative sites.  A longitudinal incision was made at the medial forefoot.  Dissection was carried down through the subcutaneous tissues.  The second metatarsal screw head was identified.  It was cleared of all soft tissue with a curette.  The screwdriver was then used to remove the screw in its entirety.  An incision was then made dorsally over the medial cuneiform.  Dissection was carried down through the subcutaneous tissues taking care to protect the extensor hallucis longus tendon.  The head of the screw was identified at the medial cuneiform.  The screwdriver was used to remove the screw in its entirety after clearing away overgrown bone with a curette.  Third incision was made over the base of the first metatarsal.  Dissection was carried down through the subcutaneous tissues again taking care to protect the extensor hallucis longus tendon.  The screw head was identified.  An osteotome was used to remove overgrown bone from around the screw head exposing it completely.  Screwdriver was used to remove the screw in its entirety.  All 3 wounds were then irrigated copiously.  Horizontal mattress sutures of 3-0 nylon were used to close the incisions.  Sterile dressings were applied followed by a compression wrap.  The tourniquet was released after application of the dressings.  The patient was awakened from anesthesia and transported to the recovery room in stable condition.  FOLLOW UP PLAN: Weightbearing as tolerated in a flat postop shoe.  Follow-up in 2 weeks for suture removal.   RADIOGRAPHS: AP and lateral radiographs of the right foot are obtained intraoperatively.  These  show interval removal of screws from the medial cuneiform, first and second metatarsals.  No other acute injuries are noted.

## 2018-01-20 NOTE — H&P (Signed)
Natasha Taylor is an 57 y.o. female.   Chief Complaint: Right foot pain HPI: Patient is a 57 year old female who complains of pain in her right medial forefoot after Lapidus bunion correction in the remote past.  She has failed nonoperative treatment to date including activity modification, oral anti-inflammatories and shoewear modification.  She presents now for removal of deep implants from her right foot medial cuneiform and first and second metatarsals.  Past Medical History:  Diagnosis Date  . Asthma    PMH  . Bipolar 1 disorder (HCC)   . GERD (gastroesophageal reflux disease)   . H/O blood clots   . Headache    migraines  . Heart murmur   . History of hiatal hernia   . Hyperlipidemia   . Hypertension   . Hypothyroidism   . Insomnia   . Moderate aortic stenosis    moderate to severe  . Painful orthopaedic hardware (HCC)   . Raynaud disease   . Scleroderma (HCC)   . Sleep apnea    wears cpap  . Wears glasses   . Wears partial dentures     Past Surgical History:  Procedure Laterality Date  . ABDOMINAL HYSTERECTOMY     TAH  . BUNIONECTOMY    . CARDIAC CATHETERIZATION N/A 04/24/2015   Procedure: Right/Left Heart Cath and Coronary Angiography;  Surgeon: Tonny Bollman, MD;  Location: [facility] INVASIVE CV LAB;  Service: Cardiovascular;  Laterality: N/A;  . CHOLECYSTECTOMY    . COLONOSCOPY WITH ESOPHAGOGASTRODUODENOSCOPY (EGD)    . MULTIPLE TOOTH EXTRACTIONS    . sympathomyectomies  2000   Bilateral wrists    Family History  Adopted: Yes  Problem Relation Age of Onset  . Heart attack Sister 39       deceased   Social History:  reports that she has never smoked. She has never used smokeless tobacco. She reports that she does not drink alcohol or use drugs.  Allergies:  Allergies  Allergen Reactions  . Morphine And Related Hives and Itching  . Gabapentin Itching and Other (See Comments)    Brain fog.  . Meperidine Itching    Medications Prior to Admission   Medication Sig Dispense Refill  . amLODipine (NORVASC) 10 MG tablet Take 1 tablet (10 mg total) by mouth daily. 90 tablet 3  . atorvastatin (LIPITOR) 20 MG tablet Take 20 mg by mouth daily.    . Cholecalciferol (VITAMIN D3) 2000 units capsule Take 2,000 Units by mouth 2 (two) times daily.    . DULoxetine (CYMBALTA) 60 MG capsule Take 120 mg by mouth every morning.     . lansoprazole (PREVACID) 30 MG capsule Take 30 mg by mouth 2 (two) times daily.     Marland Kitchen levothyroxine (SYNTHROID, LEVOTHROID) 112 MCG tablet Take 112 mcg by mouth daily before breakfast.    . Melatonin 5 MG CAPS Take 5 mg by mouth at bedtime as needed (for sleep).    . metoprolol succinate (TOPROL-XL) 50 MG 24 hr tablet Take 50 mg by mouth daily.     . Multiple Vitamin (MULTIVITAMIN WITH MINERALS) TABS tablet Take 1 tablet by mouth daily.    . Omega-3 Fatty Acids (EQL OMEGA 3 FISH OIL) 1000 MG CAPS Take 1 capsule by mouth 2 (two) times daily.     Marland Kitchen tretinoin (RETIN-A) 0.05 % cream Apply 1 application topically 3 (three) times a week.     . vitamin B-12 (CYANOCOBALAMIN) 1000 MCG tablet Take 1,000 mcg by mouth daily.     Marland Kitchen  zolpidem (AMBIEN) 10 MG tablet Take 10 mg by mouth at bedtime.     Marland Kitchen aspirin EC 81 MG tablet Take 81 mg by mouth daily.    Marland Kitchen doxycycline (VIBRAMYCIN) 100 MG capsule Take 100 mg by mouth as needed (acne break outs).       Results for orders placed or performed during the hospital encounter of 02-Feb-2018 (from the past 48 hour(s))  CBC     Status: None   Collection Time: 2018-02-02 12:32 PM  Result Value Ref Range   WBC 6.4 4.0 - 10.5 K/uL   RBC 4.93 3.87 - 5.11 MIL/uL   Hemoglobin 14.5 12.0 - 15.0 g/dL   HCT 67.8 93.8 - 10.1 %   MCV 92.3 80.0 - 100.0 fL   MCH 29.4 26.0 - 34.0 pg   MCHC 31.9 30.0 - 36.0 g/dL   RDW 75.1 02.5 - 85.2 %   Platelets 245 150 - 400 K/uL   nRBC 0.0 0.0 - 0.2 %    Comment: Performed at [facility], [address] [place], [place] [address]   No results found.  ROS no  recent fever, chills, nausea, vomiting or changes in her appetite Blood pressure (!) 143/106, pulse 63, temperature (!) 97.4 F (36.3 C), temperature source Oral, resp. rate 20, height 5\' 3"  (1.6 m), weight 86.2 kg, SpO2 99 %. Physical Exam  Well-nourished well-developed woman in no apparent distress.  Alert and oriented x4.  Mood and affect are normal.  Extraocular motions are intact.  Respirations are unlabored.  Gait is normal.  The right foot has healed surgical incisions.  Skin is otherwise healthy and intact.  No lymphadenopathy.  Pulses are palpable.  5 out of 5 strength in plantar flexion and dorsiflexion of the toes.  Assessment/Plan Right foot painful hardware at the medial cuneiform, first and second metatarsals -to the operating room today for removal of deep implants from all 3 bones.  The risks and benefits of the alternative treatment options have been discussed in detail.  The patient wishes to proceed with surgery and specifically understands risks of bleeding, infection, nerve damage, blood clots, need for additional surgery, amputation and death.   , MD 02/02/2018, 1:27 PM

## 2018-01-20 NOTE — Discharge Instructions (Addendum)
Toni Arthurs, MD [facility]  Please read the following information regarding your care after surgery.  Medications  You only need a prescription for the narcotic pain medicine (ex. oxycodone, Percocet, Norco).  All of the other medicines listed below are available over the counter. X Aleve 2 pills twice a day for the first 3 days after surgery. X acetominophen (Tylenol) 650 mg every 4-6 hours as you need for minor to moderate pain X tramadol as prescribed for severe pain  Weight Bearing X Bear weight only on your operated foot in the post-op shoe.  Cast / Splint / Dressing X Remove your dressing 3 days after surgery and cover the incisions with dry dressings.    After your dressing, cast or splint is removed; you may shower, but do not soak or scrub the wound.  Allow the water to run over it, and then gently pat it dry.  Swelling It is normal for you to have swelling where you had surgery.  To reduce swelling and pain, keep your toes above your nose for at least 3 days after surgery.  It may be necessary to keep your foot or leg elevated for several weeks.  If it hurts, it should be elevated.  Follow Up Call my office at [phone] when you are discharged from the hospital or surgery center to schedule an appointment to be seen two weeks after surgery.  Call my office at [phone] if you develop a fever >101.5 F, nausea, vomiting, bleeding from the surgical site or severe pain.

## 2018-01-20 NOTE — Transfer of Care (Signed)
Immediate Anesthesia Transfer of Care Note  Patient: Natasha Taylor  Procedure(s) Performed: Removal of deep implants right medial cuneiform, 1st metatarsal and 2nd metatarsal (Right Foot)  Patient Location: PACU  Anesthesia Type:MAC combined with regional for post-op pain  Level of Consciousness: awake, alert  and oriented  Airway & Oxygen Therapy: Patient Spontanous Breathing and Patient connected to face mask oxygen  Post-op Assessment: Report given to RN and Post -op Vital signs reviewed and stable  Post vital signs: Reviewed and stable  Last Vitals:  Vitals Value Taken Time  BP 107/65 01/20/2018  2:43 PM  Temp    Pulse 76 01/20/2018  2:48 PM  Resp 16 01/20/2018  2:48 PM  SpO2 92 % 01/20/2018  2:48 PM  Vitals shown include unvalidated device data.  Last Pain:  Vitals:   01/20/18 1345  TempSrc:   PainSc: 0-No pain      Patients Stated Pain Goal: 3 (24/26/83 4196)  Complications: No apparent anesthesia complications

## 2018-01-20 NOTE — Anesthesia Procedure Notes (Addendum)
Anesthesia Regional Block: Popliteal block   Pre-Anesthetic Checklist: ,, timeout performed, Correct Patient, Correct Site, Correct Laterality, Correct Procedure, Correct Position, site marked, Risks and benefits discussed,  Surgical consent,  Pre-op evaluation,  At surgeon's request and post-op pain management  Laterality: Right and Lower  Prep: chloraprep       Needles:  Injection technique: Single-shot  Needle Type: Echogenic Stimulator Needle     Needle Length: 10cm  Needle Gauge: 21   Needle insertion depth: 2 cm   Additional Needles:   Procedures:,,,, ultrasound used (permanent image in chart),,,,  Narrative:  Start time: 01/20/2018 1:24 PM End time: 01/20/2018 1:44 PM Injection made incrementally with aspirations every 5 mL.  Performed by: Personally  Anesthesiologist: Leilani Able, MD

## 2018-01-20 NOTE — Anesthesia Preprocedure Evaluation (Addendum)
Anesthesia Evaluation  Patient identified by MRN, date of birth, ID band Patient awake    Reviewed: Allergy & Precautions, NPO status , Patient's Chart, lab work & pertinent test results  History of Anesthesia Complications Negative for: history of anesthetic complications  Airway Mallampati: II  TM Distance: >3 FB Neck ROM: Full    Dental no notable dental hx. (+) Teeth Intact   Pulmonary asthma , sleep apnea and Continuous Positive Airway Pressure Ventilation ,    Pulmonary exam normal breath sounds clear to auscultation       Cardiovascular hypertension, Pt. on medications and Pt. on home beta blockers Normal cardiovascular exam+ Valvular Problems/Murmurs AS and AI  Rhythm:Regular Rate:Normal + Systolic murmurs  '19 TTE - Moderate concentric LVH. EF 55% to 60%. Grade 1 diastolic dysfunction. AV functionally bicuspid; severely thickened, severely calcified leaflets. Moderate AS, mild AI.  Peak gradient (S): 55 mm Hg. Valve area (VTI): 0.94 cm^2. Valve area (Vmax): 0.84 cm^2. Valve area (Vmean): 0.87 cm^2. Trivial PR.       Neuro/Psych  Headaches, PSYCHIATRIC DISORDERS Bipolar Disorder    GI/Hepatic Neg liver ROS, hiatal hernia, GERD  ,  Endo/Other  Hypothyroidism  Obesity   Renal/GU negative Renal ROS     Musculoskeletal negative musculoskeletal ROS (+)   Abdominal Normal abdominal exam  (+)   Peds  Hematology negative hematology ROS (+)   Anesthesia Other Findings   Reproductive/Obstetrics                           Anesthesia Physical Anesthesia Plan  ASA: III  Anesthesia Plan: MAC and Regional   Post-op Pain Management:    Induction:   PONV Risk Score and Plan: 2 and Treatment may vary due to age or medical condition  Airway Management Planned: Natural Airway and Nasal Cannula  Additional Equipment: None  Intra-op Plan:   Post-operative Plan:   Informed Consent: I  have reviewed the patients History and Physical, chart, labs and discussed the procedure including the risks, benefits and alternatives for the proposed anesthesia with the patient or authorized representative who has indicated his/her understanding and acceptance.   Dental advisory given  Plan Discussed with:   Anesthesia Plan Comments:       Anesthesia Quick Evaluation

## 2018-01-20 NOTE — Anesthesia Procedure Notes (Signed)
Procedure Name: General with mask airway Date/Time: 01/20/2018 2:10 PM Performed by: Hart Robinsons, CRNA Pre-anesthesia Checklist: Patient identified, Emergency Drugs available, Suction available, Patient being monitored and Timeout performed Patient Re-evaluated:Patient Re-evaluated prior to induction Oxygen Delivery Method: Simple face mask Preoxygenation: Pre-oxygenation with 100% oxygen Dental Injury: Teeth and Oropharynx as per pre-operative assessment

## 2018-01-21 ENCOUNTER — Encounter (HOSPITAL_COMMUNITY): Payer: Self-pay | Admitting: Orthopedic Surgery

## 2018-03-28 DIAGNOSIS — F330 Major depressive disorder, recurrent, mild: Secondary | ICD-10-CM | POA: Diagnosis not present

## 2018-05-05 DIAGNOSIS — E785 Hyperlipidemia, unspecified: Secondary | ICD-10-CM | POA: Diagnosis not present

## 2018-05-05 DIAGNOSIS — E039 Hypothyroidism, unspecified: Secondary | ICD-10-CM | POA: Diagnosis not present

## 2018-05-05 DIAGNOSIS — R5383 Other fatigue: Secondary | ICD-10-CM | POA: Diagnosis not present

## 2018-05-05 DIAGNOSIS — I10 Essential (primary) hypertension: Secondary | ICD-10-CM | POA: Diagnosis not present

## 2018-05-05 DIAGNOSIS — K219 Gastro-esophageal reflux disease without esophagitis: Secondary | ICD-10-CM | POA: Diagnosis not present

## 2018-05-05 DIAGNOSIS — Z1321 Encounter for screening for nutritional disorder: Secondary | ICD-10-CM | POA: Diagnosis not present

## 2018-05-24 DIAGNOSIS — M349 Systemic sclerosis, unspecified: Secondary | ICD-10-CM | POA: Diagnosis not present

## 2018-05-24 DIAGNOSIS — K219 Gastro-esophageal reflux disease without esophagitis: Secondary | ICD-10-CM | POA: Diagnosis not present

## 2018-05-24 DIAGNOSIS — I7300 Raynaud's syndrome without gangrene: Secondary | ICD-10-CM | POA: Diagnosis not present

## 2018-05-24 DIAGNOSIS — M179 Osteoarthritis of knee, unspecified: Secondary | ICD-10-CM | POA: Diagnosis not present

## 2018-05-24 DIAGNOSIS — M797 Fibromyalgia: Secondary | ICD-10-CM | POA: Diagnosis not present

## 2018-05-24 DIAGNOSIS — M25561 Pain in right knee: Secondary | ICD-10-CM | POA: Diagnosis not present

## 2018-05-31 DIAGNOSIS — G4733 Obstructive sleep apnea (adult) (pediatric): Secondary | ICD-10-CM | POA: Diagnosis not present

## 2018-08-02 ENCOUNTER — Other Ambulatory Visit (HOSPITAL_COMMUNITY): Payer: Self-pay | Admitting: Respiratory Therapy

## 2018-08-02 DIAGNOSIS — M349 Systemic sclerosis, unspecified: Secondary | ICD-10-CM

## 2018-08-02 DIAGNOSIS — J849 Interstitial pulmonary disease, unspecified: Secondary | ICD-10-CM

## 2018-08-29 DIAGNOSIS — G4733 Obstructive sleep apnea (adult) (pediatric): Secondary | ICD-10-CM | POA: Diagnosis not present

## 2018-08-29 DIAGNOSIS — R51 Headache: Secondary | ICD-10-CM | POA: Diagnosis not present

## 2018-08-29 DIAGNOSIS — Z9989 Dependence on other enabling machines and devices: Secondary | ICD-10-CM | POA: Diagnosis not present

## 2018-09-06 DIAGNOSIS — M349 Systemic sclerosis, unspecified: Secondary | ICD-10-CM | POA: Diagnosis not present

## 2018-09-06 DIAGNOSIS — I10 Essential (primary) hypertension: Secondary | ICD-10-CM | POA: Diagnosis not present

## 2018-09-06 DIAGNOSIS — G4739 Other sleep apnea: Secondary | ICD-10-CM | POA: Diagnosis not present

## 2018-09-09 DIAGNOSIS — G4733 Obstructive sleep apnea (adult) (pediatric): Secondary | ICD-10-CM | POA: Diagnosis not present

## 2018-09-12 DIAGNOSIS — R51 Headache: Secondary | ICD-10-CM | POA: Diagnosis not present

## 2018-11-29 ENCOUNTER — Other Ambulatory Visit: Payer: Self-pay | Admitting: Cardiology

## 2018-11-29 ENCOUNTER — Ambulatory Visit: Payer: Medicare HMO | Admitting: Cardiology

## 2018-11-29 NOTE — Progress Notes (Signed)
HPI: Follow-up aortic stenosis. Cardiac catheterization February 2017 showed a 25% proximal LAD lesion. Moderate aortic stenosis with mean gradient 21 mmHg. CTA November 2018 showed calcification of the aortic valve.Echocardiogram October 2019 showed normal LV systolic function, functionally bicuspid aortic valve with moderate aortic stenosis (mean gradient 25 mmHg), mild aortic insufficiency.  Since last seen,she has dyspnea with more vigorous activities unchanged.  No orthopnea, PND or pedal edema.  No chest pain.  Occasional dizziness for several seconds but no syncope.  Current Outpatient Medications  Medication Sig Dispense Refill  . amLODipine (NORVASC) 10 MG tablet TAKE 1 TABLET(10 MG) BY MOUTH DAILY 90 tablet 0  . aspirin EC 81 MG tablet Take 81 mg by mouth daily.    Marland Kitchen atorvastatin (LIPITOR) 20 MG tablet Take 20 mg by mouth daily.    . Cholecalciferol (VITAMIN D3) 2000 units capsule Take 2,000 Units by mouth 2 (two) times daily.    Marland Kitchen doxycycline (VIBRAMYCIN) 100 MG capsule Take 100 mg by mouth as needed (acne break outs).     . DULoxetine (CYMBALTA) 60 MG capsule Take 120 mg by mouth every morning.     . lansoprazole (PREVACID) 30 MG capsule Take 30 mg by mouth 2 (two) times daily.     Marland Kitchen levothyroxine (SYNTHROID, LEVOTHROID) 112 MCG tablet Take 75 mcg by mouth daily before breakfast.     . Melatonin 5 MG CAPS Take 5 mg by mouth at bedtime as needed (for sleep).    . metoprolol succinate (TOPROL-XL) 50 MG 24 hr tablet Take 50 mg by mouth daily.     . Multiple Vitamin (MULTIVITAMIN WITH MINERALS) TABS tablet Take 1 tablet by mouth daily.    . Omega-3 Fatty Acids (EQL OMEGA 3 FISH OIL) 1000 MG CAPS Take 1 capsule by mouth 2 (two) times daily.     . traMADol (ULTRAM) 50 MG tablet Take 1 tablet (50 mg total) by mouth every 6 (six) hours as needed for severe pain. 5 tablet 0  . tretinoin (RETIN-A) 0.05 % cream Apply 1 application topically 3 (three) times a week.     . vitamin B-12  (CYANOCOBALAMIN) 1000 MCG tablet Take 1,000 mcg by mouth daily.     Marland Kitchen zolpidem (AMBIEN) 10 MG tablet Take 10 mg by mouth at bedtime.      No current facility-administered medications for this visit.      Past Medical History:  Diagnosis Date  . Asthma    PMH  . Bipolar 1 disorder (HCC)   . GERD (gastroesophageal reflux disease)   . H/O blood clots   . Headache    migraines  . Heart murmur   . History of hiatal hernia   . Hyperlipidemia   . Hypertension   . Hypothyroidism   . Insomnia   . Moderate aortic stenosis    moderate to severe  . Painful orthopaedic hardware (HCC)   . Raynaud disease   . Scleroderma (HCC)   . Sleep apnea    wears cpap  . Wears glasses   . Wears partial dentures     Past Surgical History:  Procedure Laterality Date  . ABDOMINAL HYSTERECTOMY     TAH  . BUNIONECTOMY    . CARDIAC CATHETERIZATION N/A 04/24/2015   Procedure: Right/Left Heart Cath and Coronary Angiography;  Surgeon: Tonny Bollman, MD;  Location: [facility] INVASIVE CV LAB;  Service: Cardiovascular;  Laterality: N/A;  . CHOLECYSTECTOMY    . COLONOSCOPY WITH ESOPHAGOGASTRODUODENOSCOPY (EGD)    .  HARDWARE REMOVAL Right 01/20/2018   Procedure: Removal of deep implants right medial cuneiform, 1st metatarsal and 2nd metatarsal;  Surgeon: Wylene Simmer, MD;  Location: [facility];  Service: Orthopedics;  Laterality: Right;  90min  . MULTIPLE TOOTH EXTRACTIONS    . sympathomyectomies  2000   Bilateral wrists    Social History   Socioeconomic History  . Marital status: Legally Separated    Spouse name: Not on file  . Number of children: Not on file  . Years of education: Not on file  . Highest education level: Not on file  Occupational History  . Not on file  Social Needs  . Financial resource strain: Not on file  . Food insecurity    Worry: Not on file    Inability: Not on file  . Transportation needs    Medical: Not on file    Non-medical: Not on file  Tobacco Use  . Smoking status: Never  Smoker  . Smokeless tobacco: Never Used  Substance and Sexual Activity  . Alcohol use: No    Alcohol/week: 0.0 standard drinks  . Drug use: No  . Sexual activity: Not on file  Lifestyle  . Physical activity    Days per week: Not on file    Minutes per session: Not on file  . Stress: Not on file  Relationships  . Social Herbalist on phone: Not on file    Gets together: Not on file    Attends religious service: Not on file    Active member of club or organization: Not on file    Attends meetings of clubs or organizations: Not on file    Relationship status: Not on file  . Intimate partner violence    Fear of current or ex partner: Not on file    Emotionally abused: Not on file    Physically abused: Not on file    Forced sexual activity: Not on file  Other Topics Concern  . Not on file  Social History Narrative  . Not on file    Family History  Adopted: Yes  Problem Relation Age of Onset  . Heart attack Sister 36       deceased    ROS: no fevers or chills, productive cough, hemoptysis, dysphasia, odynophagia, melena, hematochezia, dysuria, hematuria, rash, seizure activity, orthopnea, PND, pedal edema, claudication. Remaining systems are negative.  Physical Exam: Well-developed well-nourished in no acute distress.  Skin is warm and dry.  HEENT is normal.  Neck is supple.  Chest is clear to auscultation with normal expansion.  Cardiovascular exam is regular rate and rhythm.  3/6 systolic murmur left sternal border radiating to the carotids.  S2 is diminished. Abdominal exam nontender or distended. No masses palpated. Extremities show no edema. neuro grossly intact  ECG-sinus rhythm at a rate of 78, no significant ST changes.  Personally reviewed  A/P  1 aortic stenosis-patient denies increasing dyspnea, chest pain or syncope.  We will plan to repeat echocardiogram October 2020.  Patient understands she will likely require aortic valve replacement in the  future.  2 hypertension-blood pressure is controlled.  Continue present medications and follow.  3 hyperlipidemia-continue statin.  Lipids and liver monitored by primary care.  4 history of nonobstructive coronary disease-no chest pain.  Continue aspirin and statin.  5 history of scleroderma.  Kirk Ruths, MD

## 2018-11-30 ENCOUNTER — Encounter: Payer: Self-pay | Admitting: Cardiology

## 2018-11-30 ENCOUNTER — Other Ambulatory Visit: Payer: Self-pay

## 2018-11-30 ENCOUNTER — Ambulatory Visit (INDEPENDENT_AMBULATORY_CARE_PROVIDER_SITE_OTHER): Payer: Medicare HMO | Admitting: Cardiology

## 2018-11-30 VITALS — BP 132/88 | HR 78 | Ht 63.0 in | Wt 190.0 lb

## 2018-11-30 DIAGNOSIS — E7800 Pure hypercholesterolemia, unspecified: Secondary | ICD-10-CM | POA: Diagnosis not present

## 2018-11-30 DIAGNOSIS — I10 Essential (primary) hypertension: Secondary | ICD-10-CM

## 2018-11-30 DIAGNOSIS — I2510 Atherosclerotic heart disease of native coronary artery without angina pectoris: Secondary | ICD-10-CM

## 2018-11-30 DIAGNOSIS — I350 Nonrheumatic aortic (valve) stenosis: Secondary | ICD-10-CM | POA: Diagnosis not present

## 2018-11-30 NOTE — Patient Instructions (Signed)
Your physician recommends that you continue on your current medications as directed. Please refer to the Current Medication list given to you today.   Your physician has requested that you have an echocardiogram. Echocardiography is a painless test that uses sound waves to create images of your heart. It provides your doctor with information about the size and shape of your heart and how well your heart's chambers and valves are working. This procedure takes approximately one hour. There are no restrictions for this procedure.  Your physician wants you to follow-up in: [place] will receive a reminder letter in the mail two months in advance. If you don't receive a letter, please call our office to schedule the follow-up appointment.

## 2019-01-02 ENCOUNTER — Other Ambulatory Visit (HOSPITAL_COMMUNITY): Payer: Medicare HMO

## 2019-01-05 ENCOUNTER — Other Ambulatory Visit: Payer: Self-pay

## 2019-01-05 ENCOUNTER — Ambulatory Visit (HOSPITAL_COMMUNITY): Payer: Medicare HMO | Attending: Cardiology

## 2019-01-05 DIAGNOSIS — I350 Nonrheumatic aortic (valve) stenosis: Secondary | ICD-10-CM | POA: Diagnosis not present

## 2019-01-06 ENCOUNTER — Telehealth: Payer: Self-pay | Admitting: *Deleted

## 2019-01-06 NOTE — Telephone Encounter (Signed)
Labs from [facility], total cholesterol= 226, trig=469, HDL=30 and LDL not calculated due to trig over 400 reviewed by dr Stanford Breed. Patient to increase lipitor to 40 mg daily and repeat lab work in 12 weeks.  Left message for pt to call

## 2019-01-09 NOTE — Telephone Encounter (Signed)
Follow up:    Patient returning call back from Friday concerning lab results. Please call patient.

## 2019-01-09 NOTE — Telephone Encounter (Signed)
Called patient- she states that she had the blood work redrawn due to not feeling that it was right before- and the blood work came back and was all normal, they have re-faxed over the new blood work to [facility],. Will route to MD and primary to make aware and advised patient we would let her know of any changes after review of new blood work.

## 2019-01-10 NOTE — Telephone Encounter (Signed)
Pull labs for review Kirk Ruths

## 2019-01-12 NOTE — Telephone Encounter (Signed)
Most recent labs requested from [facility].

## 2019-05-23 NOTE — Progress Notes (Deleted)
HPI: Follow-up aortic stenosis. Cardiac catheterization February 2017 showed a 25% proximal LAD lesion. Moderate aortic stenosis with mean gradient 21 mmHg. CTA November 2018 showed calcification of the aortic valve.Echocardiogram October 2019 showed normal LV systolic function, functionally bicuspid aortic valve with moderate aortic stenosis (mean gradient 25 mmHg), mild aortic insufficiency.    Echocardiogram October 2020 showed normal LV function, moderate left ventricular hypertrophy, mild left atrial enlargement, severe aortic stenosis with mean gradient 40 mmHg. Since last seen,  Current Outpatient Medications  Medication Sig Dispense Refill  . amLODipine (NORVASC) 10 MG tablet TAKE 1 TABLET(10 MG) BY MOUTH DAILY 90 tablet 0  . aspirin EC 81 MG tablet Take 81 mg by mouth daily.    Marland Kitchen atorvastatin (LIPITOR) 20 MG tablet Take 20 mg by mouth daily.    . Cholecalciferol (VITAMIN D3) 2000 units capsule Take 2,000 Units by mouth 2 (two) times daily.    Marland Kitchen doxycycline (VIBRAMYCIN) 100 MG capsule Take 100 mg by mouth as needed (acne break outs).     . DULoxetine (CYMBALTA) 60 MG capsule Take 120 mg by mouth every morning.     . lansoprazole (PREVACID) 30 MG capsule Take 30 mg by mouth 2 (two) times daily.     Marland Kitchen levothyroxine (SYNTHROID, LEVOTHROID) 112 MCG tablet Take 75 mcg by mouth daily before breakfast.     . Melatonin 5 MG CAPS Take 5 mg by mouth at bedtime as needed (for sleep).    . metoprolol succinate (TOPROL-XL) 50 MG 24 hr tablet Take 50 mg by mouth daily.     . Multiple Vitamin (MULTIVITAMIN WITH MINERALS) TABS tablet Take 1 tablet by mouth daily.    . Omega-3 Fatty Acids (EQL OMEGA 3 FISH OIL) 1000 MG CAPS Take 1 capsule by mouth 2 (two) times daily.     Marland Kitchen tretinoin (RETIN-A) 0.05 % cream Apply 1 application topically 3 (three) times a week.     . vitamin B-12 (CYANOCOBALAMIN) 1000 MCG tablet Take 1,000 mcg by mouth daily.     Marland Kitchen zolpidem (AMBIEN) 10 MG tablet Take 10 mg by  mouth at bedtime.      No current facility-administered medications for this visit.     Past Medical History:  Diagnosis Date  . Asthma    PMH  . Bipolar 1 disorder (HCC)   . GERD (gastroesophageal reflux disease)   . H/O blood clots   . Headache    migraines  . Heart murmur   . History of hiatal hernia   . Hyperlipidemia   . Hypertension   . Hypothyroidism   . Insomnia   . Moderate aortic stenosis    moderate to severe  . Painful orthopaedic hardware (HCC)   . Raynaud disease   . Scleroderma (HCC)   . Sleep apnea    wears cpap  . Wears glasses   . Wears partial dentures     Past Surgical History:  Procedure Laterality Date  . ABDOMINAL HYSTERECTOMY     TAH  . BUNIONECTOMY    . CARDIAC CATHETERIZATION N/A 04/24/2015   Procedure: Right/Left Heart Cath and Coronary Angiography;  Surgeon: Tonny Bollman, MD;  Location: [facility] INVASIVE CV LAB;  Service: Cardiovascular;  Laterality: N/A;  . CHOLECYSTECTOMY    . COLONOSCOPY WITH ESOPHAGOGASTRODUODENOSCOPY (EGD)    . HARDWARE REMOVAL Right 01/20/2018   Procedure: Removal of deep implants right medial cuneiform, 1st metatarsal and 2nd metatarsal;  Surgeon: Toni Arthurs, MD;  Location: [facility] OR;  Service:  Orthopedics;  Laterality: Right;  70min  . MULTIPLE TOOTH EXTRACTIONS    . sympathomyectomies  2000   Bilateral wrists    Social History   Socioeconomic History  . Marital status: Legally Separated    Spouse name: Not on file  . Number of children: Not on file  . Years of education: Not on file  . Highest education level: Not on file  Occupational History  . Not on file  Tobacco Use  . Smoking status: Never Smoker  . Smokeless tobacco: Never Used  Substance and Sexual Activity  . Alcohol use: No    Alcohol/week: 0.0 standard drinks  . Drug use: No  . Sexual activity: Not on file  Other Topics Concern  . Not on file  Social History Narrative  . Not on file   Social Determinants of Health   Financial Resource  Strain:   . Difficulty of Paying Living Expenses: Not on file  Food Insecurity:   . Worried About Charity fundraiser in the Last Year: Not on file  . Ran Out of Food in the Last Year: Not on file  Transportation Needs:   . Lack of Transportation (Medical): Not on file  . Lack of Transportation (Non-Medical): Not on file  Physical Activity:   . Days of Exercise per Week: Not on file  . Minutes of Exercise per Session: Not on file  Stress:   . Feeling of Stress : Not on file  Social Connections:   . Frequency of Communication with Friends and Family: Not on file  . Frequency of Social Gatherings with Friends and Family: Not on file  . Attends Religious Services: Not on file  . Active Member of Clubs or Organizations: Not on file  . Attends Archivist Meetings: Not on file  . Marital Status: Not on file  Intimate Partner Violence:   . Fear of Current or Ex-Partner: Not on file  . Emotionally Abused: Not on file  . Physically Abused: Not on file  . Sexually Abused: Not on file    Family History  Adopted: Yes  Problem Relation Age of Onset  . Heart attack Sister 37       deceased    ROS: no fevers or chills, productive cough, hemoptysis, dysphasia, odynophagia, melena, hematochezia, dysuria, hematuria, rash, seizure activity, orthopnea, PND, pedal edema, claudication. Remaining systems are negative.  Physical Exam: Well-developed well-nourished in no acute distress.  Skin is warm and dry.  HEENT is normal.  Neck is supple.  Chest is clear to auscultation with normal expansion.  Cardiovascular exam is regular rate and rhythm.  Abdominal exam nontender or distended. No masses palpated. Extremities show no edema. neuro grossly intact  ECG- personally reviewed  A/P  1 aortic stenosis-patient is not having symptoms.  Most recent study showed severe aortic stenosis.  Patient understands she will require aortic valve replacement in the future.  We will plan follow-up  echocardiogram October 2021 or sooner if she develops symptoms.  2 hypertension-blood pressure controlled.  Continue present medical regimen.  3 hyperlipidemia-continue statin.  4 nonobstructive coronary disease-she has not had chest pain.  Continue medical therapy with aspirin and statin.  5 history of scleroderma  Kirk Ruths, MD

## 2019-05-29 ENCOUNTER — Telehealth: Payer: Self-pay | Admitting: Cardiology

## 2019-05-29 NOTE — Telephone Encounter (Signed)
Patient wanted to know if her daughter could be present during the patient's appointment on Wednesday. The patient was told possible surgery options would be discussed and she would like to have her daughter present. Please let her know what the office decides.

## 2019-05-29 NOTE — Telephone Encounter (Signed)
The patient has been made aware that she will need to come back for the appointment by herself due to covid restrictions but that Dr. Jens Som can all the patient's daughter is that is what she would like. She has verbalized her understanding.

## 2019-05-31 ENCOUNTER — Ambulatory Visit: Payer: Medicare HMO | Admitting: Cardiology

## 2019-06-15 NOTE — Progress Notes (Deleted)
HPI: Follow-up aortic stenosis. Cardiac catheterization February 2017 showed a 25% proximal LAD lesion. Moderate aortic stenosis with mean gradient 21 mmHg. CTA November 2018 showed calcification of the aortic valve. Echocardiogram October 2020 showed normal LV function, moderate left ventricular hypertrophy, mild left atrial enlargement, severe aortic stenosis with mean gradient 40 mmHg and mild aortic insufficiency. Since last seen,  Current Outpatient Medications  Medication Sig Dispense Refill  . amLODipine (NORVASC) 10 MG tablet TAKE 1 TABLET(10 MG) BY MOUTH DAILY 90 tablet 0  . aspirin EC 81 MG tablet Take 81 mg by mouth daily.    Marland Kitchen atorvastatin (LIPITOR) 20 MG tablet Take 20 mg by mouth daily.    . Cholecalciferol (VITAMIN D3) 2000 units capsule Take 2,000 Units by mouth 2 (two) times daily.    Marland Kitchen doxycycline (VIBRAMYCIN) 100 MG capsule Take 100 mg by mouth as needed (acne break outs).     . DULoxetine (CYMBALTA) 60 MG capsule Take 120 mg by mouth every morning.     . lansoprazole (PREVACID) 30 MG capsule Take 30 mg by mouth 2 (two) times daily.     Marland Kitchen levothyroxine (SYNTHROID, LEVOTHROID) 112 MCG tablet Take 75 mcg by mouth daily before breakfast.     . Melatonin 5 MG CAPS Take 5 mg by mouth at bedtime as needed (for sleep).    . metoprolol succinate (TOPROL-XL) 50 MG 24 hr tablet Take 50 mg by mouth daily.     . Multiple Vitamin (MULTIVITAMIN WITH MINERALS) TABS tablet Take 1 tablet by mouth daily.    . Omega-3 Fatty Acids (EQL OMEGA 3 FISH OIL) 1000 MG CAPS Take 1 capsule by mouth 2 (two) times daily.     Marland Kitchen tretinoin (RETIN-A) 0.05 % cream Apply 1 application topically 3 (three) times a week.     . vitamin B-12 (CYANOCOBALAMIN) 1000 MCG tablet Take 1,000 mcg by mouth daily.     Marland Kitchen zolpidem (AMBIEN) 10 MG tablet Take 10 mg by mouth at bedtime.      No current facility-administered medications for this visit.     Past Medical History:  Diagnosis Date  . Asthma    PMH  .  Bipolar 1 disorder ([place])   . GERD (gastroesophageal reflux disease)   . H/O blood clots   . Headache    migraines  . Heart murmur   . History of hiatal hernia   . Hyperlipidemia   . Hypertension   . Hypothyroidism   . Insomnia   . Moderate aortic stenosis    moderate to severe  . Painful orthopaedic hardware ([place])   . Raynaud disease   . Scleroderma ([place])   . Sleep apnea    wears cpap  . Wears glasses   . Wears partial dentures     Past Surgical History:  Procedure Laterality Date  . ABDOMINAL HYSTERECTOMY     TAH  . BUNIONECTOMY    . CARDIAC CATHETERIZATION N/A 04/24/2015   Procedure: Right/Left Heart Cath and Coronary Angiography;  Surgeon: Sherren Mocha, MD;  Location: [facility];  Service: Cardiovascular;  Laterality: N/A;  . CHOLECYSTECTOMY    . COLONOSCOPY WITH ESOPHAGOGASTRODUODENOSCOPY (EGD)    . HARDWARE REMOVAL Right 01/20/2018   Procedure: Removal of deep implants right medial cuneiform, 1st metatarsal and 2nd metatarsal;  Surgeon: Wylene Simmer, MD;  Location: [place];  Service: Orthopedics;  Laterality: Right;  42min  . MULTIPLE TOOTH EXTRACTIONS    . sympathomyectomies  2000   Bilateral  wrists    Social History   Socioeconomic History  . Marital status: Legally Separated    Spouse name: Not on file  . Number of children: Not on file  . Years of education: Not on file  . Highest education level: Not on file  Occupational History  . Not on file  Tobacco Use  . Smoking status: Never Smoker  . Smokeless tobacco: Never Used  Substance and Sexual Activity  . Alcohol use: No    Alcohol/week: 0.0 standard drinks  . Drug use: No  . Sexual activity: Not on file  Other Topics Concern  . Not on file  Social History Narrative  . Not on file   Social Determinants of Health   Financial Resource Strain:   . Difficulty of Paying Living Expenses:   Food Insecurity:   . Worried About Programme researcher, broadcasting/film/video in the Last Year:   . Barista in the  Last Year:   Transportation Needs:   . Freight forwarder (Medical):   Marland Kitchen Lack of Transportation (Non-Medical):   Physical Activity:   . Days of Exercise per Week:   . Minutes of Exercise per Session:   Stress:   . Feeling of Stress :   Social Connections:   . Frequency of Communication with Friends and Family:   . Frequency of Social Gatherings with Friends and Family:   . Attends Religious Services:   . Active Member of Clubs or Organizations:   . Attends Banker Meetings:   Marland Kitchen Marital Status:   Intimate Partner Violence:   . Fear of Current or Ex-Partner:   . Emotionally Abused:   Marland Kitchen Physically Abused:   . Sexually Abused:     Family History  Adopted: Yes  Problem Relation Age of Onset  . Heart attack Sister 51       deceased    ROS: no fevers or chills, productive cough, hemoptysis, dysphasia, odynophagia, melena, hematochezia, dysuria, hematuria, rash, seizure activity, orthopnea, PND, pedal edema, claudication. Remaining systems are negative.  Physical Exam: Well-developed well-nourished in no acute distress.  Skin is warm and dry.  HEENT is normal.  Neck is supple.  Chest is clear to auscultation with normal expansion.  Cardiovascular exam is regular rate and rhythm.  Abdominal exam nontender or distended. No masses palpated. Extremities show no edema. neuro grossly intact  ECG- personally reviewed  A/P  1 aortic stenosis-severe on most recent echocardiogram.  Patient has not had any symptoms of chest pain, dyspnea or syncope.  Plan repeat echocardiogram October 2021 or sooner if she becomes symptomatic.  She understands she will likely require aortic valve replacement in the near future.  2 hypertension-blood pressure controlled.  Continue present medical regimen.  3 hyperlipidemia-continue statin.  4 nonobstructive coronary disease-plan to continue medical therapy with aspirin and statin.  5 scleroderma  Olga Millers, MD

## 2019-06-21 ENCOUNTER — Ambulatory Visit: Payer: Medicare HMO | Admitting: Cardiology

## 2019-07-13 NOTE — H&P (View-Only) (Signed)
HPI: Follow-up aortic stenosis. Cardiac catheterization February 2017 showed a 25% proximal LAD lesion. Moderate aortic stenosis with mean gradient 21 mmHg. CTA November 2018 showed calcification of the aortic valve; no thoracic aortic aneurysm.Last echocardiogram October 2020 showed normal LV function, mild left atrial enlargement, severe aortic stenosis with mean gradient 40 mmHg, mild aortic insufficiency.  Since last seen,patient has noted increasing dyspnea on exertion.  There is no orthopnea or PND but there is occasional mild pedal edema.  She denies exertional chest pain or syncope.  Current Outpatient Medications  Medication Sig Dispense Refill  . amLODipine (NORVASC) 10 MG tablet TAKE 1 TABLET(10 MG) BY MOUTH DAILY 90 tablet 0  . aspirin EC 81 MG tablet Take 81 mg by mouth daily.    Marland Kitchen atorvastatin (LIPITOR) 20 MG tablet Take 20 mg by mouth daily.    . Cholecalciferol (VITAMIN D3) 2000 units capsule Take 2,000 Units by mouth 2 (two) times daily.    Marland Kitchen doxycycline (VIBRAMYCIN) 100 MG capsule Take 100 mg by mouth as needed (acne break outs).     . lansoprazole (PREVACID) 30 MG capsule Take 30 mg by mouth 2 (two) times daily.     Marland Kitchen levothyroxine (SYNTHROID, LEVOTHROID) 112 MCG tablet Take 75 mcg by mouth daily before breakfast.     . Melatonin 5 MG CAPS Take 5 mg by mouth at bedtime as needed (for sleep).    . metoprolol succinate (TOPROL-XL) 50 MG 24 hr tablet Take 50 mg by mouth daily.     . Multiple Vitamin (MULTIVITAMIN WITH MINERALS) TABS tablet Take 1 tablet by mouth daily.    . Omega-3 Fatty Acids (EQL OMEGA 3 FISH OIL) 1000 MG CAPS Take 1 capsule by mouth 2 (two) times daily.     Marland Kitchen tretinoin (RETIN-A) 0.05 % cream Apply 1 application topically 3 (three) times a week.     . vitamin B-12 (CYANOCOBALAMIN) 1000 MCG tablet Take 1,000 mcg by mouth daily.      No current facility-administered medications for this visit.     Past Medical History:  Diagnosis Date  . Asthma    PMH  . Bipolar 1 disorder (HCC)   . GERD (gastroesophageal reflux disease)   . H/O blood clots   . Headache    migraines  . Heart murmur   . History of hiatal hernia   . Hyperlipidemia   . Hypertension   . Hypothyroidism   . Insomnia   . Moderate aortic stenosis    moderate to severe  . Painful orthopaedic hardware (HCC)   . Raynaud disease   . Scleroderma (HCC)   . Sleep apnea    wears cpap  . Wears glasses   . Wears partial dentures     Past Surgical History:  Procedure Laterality Date  . ABDOMINAL HYSTERECTOMY     TAH  . BUNIONECTOMY    . CARDIAC CATHETERIZATION N/A 04/24/2015   Procedure: Right/Left Heart Cath and Coronary Angiography;  Surgeon: Tonny Bollman, MD;  Location: [facility] INVASIVE CV LAB;  Service: Cardiovascular;  Laterality: N/A;  . CHOLECYSTECTOMY    . COLONOSCOPY WITH ESOPHAGOGASTRODUODENOSCOPY (EGD)    . HARDWARE REMOVAL Right 01/20/2018   Procedure: Removal of deep implants right medial cuneiform, 1st metatarsal and 2nd metatarsal;  Surgeon: Toni Arthurs, MD;  Location: [facility] OR;  Service: Orthopedics;  Laterality: Right;   . MULTIPLE TOOTH EXTRACTIONS    . sympathomyectomies  2000   Bilateral wrists    Social History  Socioeconomic History  . Marital status: Legally Separated    Spouse name: Not on file  . Number of children: Not on file  . Years of education: Not on file  . Highest education level: Not on file  Occupational History  . Not on file  Tobacco Use  . Smoking status: Never Smoker  . Smokeless tobacco: Never Used  Substance and Sexual Activity  . Alcohol use: No    Alcohol/week: 0.0 standard drinks  . Drug use: No  . Sexual activity: Not on file  Other Topics Concern  . Not on file  Social History Narrative  . Not on file   Social Determinants of Health   Financial Resource Strain:   . Difficulty of Paying Living Expenses:   Food Insecurity:   . Worried About Charity fundraiser in the Last Year:   . Arboriculturist in  the Last Year:   Transportation Needs:   . Film/video editor (Medical):   Marland Kitchen Lack of Transportation (Non-Medical):   Physical Activity:   . Days of Exercise per Week:   . Minutes of Exercise per Session:   Stress:   . Feeling of Stress :   Social Connections:   . Frequency of Communication with Friends and Family:   . Frequency of Social Gatherings with Friends and Family:   . Attends Religious Services:   . Active Member of Clubs or Organizations:   . Attends Archivist Meetings:   Marland Kitchen Marital Status:   Intimate Partner Violence:   . Fear of Current or Ex-Partner:   . Emotionally Abused:   Marland Kitchen Physically Abused:   . Sexually Abused:     Family History  Adopted: Yes  Problem Relation Age of Onset  . Heart attack Sister 89       deceased    ROS: no fevers or chills, productive cough, hemoptysis, dysphasia, odynophagia, melena, hematochezia, dysuria, hematuria, rash, seizure activity, orthopnea, PND, pedal edema, claudication. Remaining systems are negative.  Physical Exam: Well-developed well-nourished in no acute distress.  Skin is warm and dry.  HEENT is normal.  Neck is supple.  Chest is clear to auscultation with normal expansion.  Cardiovascular exam is regular rate and rhythm.  3/6 systolic murmur left sternal border.  S2 minimally diminished. Abdominal exam nontender or distended. No masses palpated. Extremities show no edema. neuro grossly intact  ECG-sinus rhythm at a rate of 75, normal axis, nonspecific ST changes.  Personally reviewed  A/P  1 aortic stenosis-patient is having worsening dyspnea on exertion.  Her follow-up echocardiogram shows progressive aortic stenosis which is now severe.  I have discussed options with her and feel that we should proceed with aortic valve replacement.  We will arrange a right and left cardiac catheterization.  We will then have patient see CVTS for consideration of aortic valve replacement.    2 hypertension-blood  pressure controlled.  Continue present medical regimen.  3 hyperlipidemia-continue statin.  4 history of nonobstructive coronary disease-continue aspirin and statin.  5 history of scleroderma.  Kirk Ruths, MD

## 2019-07-13 NOTE — Progress Notes (Signed)
HPI: Follow-up aortic stenosis. Cardiac catheterization February 2017 showed a 25% proximal LAD lesion. Moderate aortic stenosis with mean gradient 21 mmHg. CTA November 2018 showed calcification of the aortic valve; no thoracic aortic aneurysm.Last echocardiogram October 2020 showed normal LV function, mild left atrial enlargement, severe aortic stenosis with mean gradient 40 mmHg, mild aortic insufficiency.  Since last seen,patient has noted increasing dyspnea on exertion.  There is no orthopnea or PND but there is occasional mild pedal edema.  She denies exertional chest pain or syncope.  Current Outpatient Medications  Medication Sig Dispense Refill  . amLODipine (NORVASC) 10 MG tablet TAKE 1 TABLET(10 MG) BY MOUTH DAILY 90 tablet 0  . aspirin EC 81 MG tablet Take 81 mg by mouth daily.    Marland Kitchen atorvastatin (LIPITOR) 20 MG tablet Take 20 mg by mouth daily.    . Cholecalciferol (VITAMIN D3) 2000 units capsule Take 2,000 Units by mouth 2 (two) times daily.    Marland Kitchen doxycycline (VIBRAMYCIN) 100 MG capsule Take 100 mg by mouth as needed (acne break outs).     . lansoprazole (PREVACID) 30 MG capsule Take 30 mg by mouth 2 (two) times daily.     Marland Kitchen levothyroxine (SYNTHROID, LEVOTHROID) 112 MCG tablet Take 75 mcg by mouth daily before breakfast.     . Melatonin 5 MG CAPS Take 5 mg by mouth at bedtime as needed (for sleep).    . metoprolol succinate (TOPROL-XL) 50 MG 24 hr tablet Take 50 mg by mouth daily.     . Multiple Vitamin (MULTIVITAMIN WITH MINERALS) TABS tablet Take 1 tablet by mouth daily.    . Omega-3 Fatty Acids (EQL OMEGA 3 FISH OIL) 1000 MG CAPS Take 1 capsule by mouth 2 (two) times daily.     Marland Kitchen tretinoin (RETIN-A) 0.05 % cream Apply 1 application topically 3 (three) times a week.     . vitamin B-12 (CYANOCOBALAMIN) 1000 MCG tablet Take 1,000 mcg by mouth daily.      No current facility-administered medications for this visit.     Past Medical History:  Diagnosis Date  . Asthma    PMH  . Bipolar 1 disorder (HCC)   . GERD (gastroesophageal reflux disease)   . H/O blood clots   . Headache    migraines  . Heart murmur   . History of hiatal hernia   . Hyperlipidemia   . Hypertension   . Hypothyroidism   . Insomnia   . Moderate aortic stenosis    moderate to severe  . Painful orthopaedic hardware (HCC)   . Raynaud disease   . Scleroderma (HCC)   . Sleep apnea    wears cpap  . Wears glasses   . Wears partial dentures     Past Surgical History:  Procedure Laterality Date  . ABDOMINAL HYSTERECTOMY     TAH  . BUNIONECTOMY    . CARDIAC CATHETERIZATION N/A 04/24/2015   Procedure: Right/Left Heart Cath and Coronary Angiography;  Surgeon: Tonny Bollman, MD;  Location: [facility] INVASIVE CV LAB;  Service: Cardiovascular;  Laterality: N/A;  . CHOLECYSTECTOMY    . COLONOSCOPY WITH ESOPHAGOGASTRODUODENOSCOPY (EGD)    . HARDWARE REMOVAL Right 01/20/2018   Procedure: Removal of deep implants right medial cuneiform, 1st metatarsal and 2nd metatarsal;  Surgeon: Toni Arthurs, MD;  Location: [facility] OR;  Service: Orthopedics;  Laterality: Right;   . MULTIPLE TOOTH EXTRACTIONS    . sympathomyectomies  2000   Bilateral wrists    Social History  Socioeconomic History  . Marital status: Legally Separated    Spouse name: Not on file  . Number of children: Not on file  . Years of education: Not on file  . Highest education level: Not on file  Occupational History  . Not on file  Tobacco Use  . Smoking status: Never Smoker  . Smokeless tobacco: Never Used  Substance and Sexual Activity  . Alcohol use: No    Alcohol/week: 0.0 standard drinks  . Drug use: No  . Sexual activity: Not on file  Other Topics Concern  . Not on file  Social History Narrative  . Not on file   Social Determinants of Health   Financial Resource Strain:   . Difficulty of Paying Living Expenses:   Food Insecurity:   . Worried About Running Out of Food in the Last Year:   . Ran Out of Food in  the Last Year:   Transportation Needs:   . Lack of Transportation (Medical):   . Lack of Transportation (Non-Medical):   Physical Activity:   . Days of Exercise per Week:   . Minutes of Exercise per Session:   Stress:   . Feeling of Stress :   Social Connections:   . Frequency of Communication with Friends and Family:   . Frequency of Social Gatherings with Friends and Family:   . Attends Religious Services:   . Active Member of Clubs or Organizations:   . Attends Club or Organization Meetings:   . Marital Status:   Intimate Partner Violence:   . Fear of Current or Ex-Partner:   . Emotionally Abused:   . Physically Abused:   . Sexually Abused:     Family History  Adopted: Yes  Problem Relation Age of Onset  . Heart attack Sister 44       deceased    ROS: no fevers or chills, productive cough, hemoptysis, dysphasia, odynophagia, melena, hematochezia, dysuria, hematuria, rash, seizure activity, orthopnea, PND, pedal edema, claudication. Remaining systems are negative.  Physical Exam: Well-developed well-nourished in no acute distress.  Skin is warm and dry.  HEENT is normal.  Neck is supple.  Chest is clear to auscultation with normal expansion.  Cardiovascular exam is regular rate and rhythm.  3/6 systolic murmur left sternal border.  S2 minimally diminished. Abdominal exam nontender or distended. No masses palpated. Extremities show no edema. neuro grossly intact  ECG-sinus rhythm at a rate of 75, normal axis, nonspecific ST changes.  Personally reviewed  A/P  1 aortic stenosis-patient is having worsening dyspnea on exertion.  Her follow-up echocardiogram shows progressive aortic stenosis which is now severe.  I have discussed options with her and feel that we should proceed with aortic valve replacement.  We will arrange a right and left cardiac catheterization.  We will then have patient see CVTS for consideration of aortic valve replacement.    2 hypertension-blood  pressure controlled.  Continue present medical regimen.  3 hyperlipidemia-continue statin.  4 history of nonobstructive coronary disease-continue aspirin and statin.  5 history of scleroderma.  Virl Coble, MD    

## 2019-07-19 ENCOUNTER — Other Ambulatory Visit: Payer: Self-pay

## 2019-07-19 ENCOUNTER — Other Ambulatory Visit: Payer: Self-pay | Admitting: *Deleted

## 2019-07-19 ENCOUNTER — Ambulatory Visit: Payer: Medicare HMO | Admitting: Cardiology

## 2019-07-19 ENCOUNTER — Encounter: Payer: Self-pay | Admitting: Cardiology

## 2019-07-19 VITALS — BP 134/86 | HR 75 | Ht 63.0 in | Wt 189.1 lb

## 2019-07-19 DIAGNOSIS — I350 Nonrheumatic aortic (valve) stenosis: Secondary | ICD-10-CM

## 2019-07-19 DIAGNOSIS — E7800 Pure hypercholesterolemia, unspecified: Secondary | ICD-10-CM | POA: Diagnosis not present

## 2019-07-19 DIAGNOSIS — I2510 Atherosclerotic heart disease of native coronary artery without angina pectoris: Secondary | ICD-10-CM

## 2019-07-19 DIAGNOSIS — I10 Essential (primary) hypertension: Secondary | ICD-10-CM

## 2019-07-19 MED ORDER — SODIUM CHLORIDE 0.9% FLUSH: 3.0000 mL | Freq: Two times a day (BID) | INTRAVENOUS | Status: DC

## 2019-07-19 NOTE — Patient Instructions (Signed)
    [facility] CARDIOVASCULAR DIVISION [facility] [place] [address] [place] [address] Dept: [phone] Loc: [phone]  EISHA CHATTERJEE  07/19/2019  You are scheduled for a Cardiac Catheterization on Wednesday, May 5 with Dr. Tonny Bollman.  1. Please arrive at the [facility] (Main Entrance A) at [facility]: [address] [place], [place] [address] at 9:30 AM (This time is two hours before your procedure to ensure your preparation). Free valet parking service is available.   Special note: Every effort is made to have your procedure done on time. Please understand that emergencies sometimes delay scheduled procedures.  2. Diet: Do not eat solid foods after midnight.  The patient may have clear liquids until 5am upon the day of the procedure.  3. Labs: You will need to have blood drawn on today.  GO TO [address] IN [place] Monday 07/24/19 @ 11:50 AM FOR COVID TESTING  4. Medication instructions in preparation for your procedure:  On the morning of your procedure, take your Aspirin and any morning medicines NOT listed above.  You may use sips of water.  5. Plan for one night stay--bring personal belongings. 6. Bring a current list of your medications and current insurance cards. 7. You MUST have a responsible person to drive you home. 8. Someone MUST be with you the first 24 hours after you arrive home or your discharge will be delayed. 9. Please wear clothes that are easy to get on and off and wear slip-on shoes.  Thank you for allowing [place] to care for you!   -- [facility]   REFERRAL TO DR Cornelius Moras AT [facility]  Your physician recommends that you schedule a follow-up appointment in: 3 MONTHS WITH DR Jens Som

## 2019-07-20 ENCOUNTER — Encounter: Payer: Self-pay | Admitting: *Deleted

## 2019-07-20 LAB — CBC
Hematocrit: 43.8 % (ref 34.0–46.6)
Hemoglobin: 14.8 g/dL (ref 11.1–15.9)
MCH: 30.9 pg (ref 26.6–33.0)
MCHC: 33.8 g/dL (ref 31.5–35.7)
MCV: 91 fL (ref 79–97)
Platelets: 256 10*3/uL (ref 150–450)
RBC: 4.79 x10E6/uL (ref 3.77–5.28)
RDW: 13.1 % (ref 11.7–15.4)
WBC: 5.6 10*3/uL (ref 3.4–10.8)

## 2019-07-20 LAB — BASIC METABOLIC PANEL
BUN/Creatinine Ratio: 13 (ref 9–23)
BUN: 11 mg/dL (ref 6–24)
CO2: 23 mmol/L (ref 20–29)
Calcium: 10.5 mg/dL — ABNORMAL HIGH (ref 8.7–10.2)
Chloride: 106 mmol/L (ref 96–106)
Creatinine, Ser: 0.88 mg/dL (ref 0.57–1.00)
GFR calc Af Amer: 83 mL/min/{1.73_m2} (ref >59)
GFR calc non Af Amer: 72 mL/min/{1.73_m2} (ref >59)
Glucose: 91 mg/dL (ref 65–99)
Potassium: 4.4 mmol/L (ref 3.5–5.2)
Sodium: 142 mmol/L (ref 134–144)

## 2019-07-24 ENCOUNTER — Other Ambulatory Visit (HOSPITAL_COMMUNITY)
Admission: RE | Admit: 2019-07-24 | Discharge: 2019-07-24 | Disposition: A | Discharge status: Home / Self Care | Payer: Medicare HMO | Source: Ambulatory Visit | Attending: Cardiovascular Disease | Admitting: Cardiovascular Disease

## 2019-07-24 DIAGNOSIS — Z20822 Contact with and (suspected) exposure to covid-19: Secondary | ICD-10-CM | POA: Diagnosis not present

## 2019-07-24 DIAGNOSIS — Z01812 Encounter for preprocedural laboratory examination: Secondary | ICD-10-CM | POA: Diagnosis present

## 2019-07-24 LAB — SARS CORONAVIRUS 2 (TAT 6-24 HRS): SARS Coronavirus 2: NEGATIVE

## 2019-07-25 ENCOUNTER — Telehealth: Payer: Self-pay | Admitting: *Deleted

## 2019-07-25 NOTE — Telephone Encounter (Signed)
Pt contacted pre-catheterization scheduled at [facility] for: Wednesday Jul 26, 2019 11:30 AM Verified arrival time and place: [facility] Main Entrance A [facility]) at: 9:30 AM   No solid food after midnight prior to cath, clear liquids until 5 AM day of procedure.   AM meds can be  taken pre-cath with sip of water including: ASA 81 mg   Confirmed patient has responsible adult to drive home post procedure and observe 24 hours after arriving home: yes  You are allowed ONE visitor in the waiting room during your procedures. Both you and your visitor must wear masks.      COVID-19 Pre-Screening Questions:  . In the past 7 to 10 days have you had a cough,  shortness of breath, headache, congestion, fever (100 or greater) body aches, chills, sore throat, or sudden loss of taste or sense of smell? no . Have you been around anyone who is awaiting Covid 19 test results in the past 7 to 10 days? no . Have you been around anyone who has mentioned symptoms of Covid 19 within the past 7 to 10 days? No  Reviewed procedure/mask/visitor instructions, COVID-19 screening questions with patient.

## 2019-07-26 ENCOUNTER — Encounter (HOSPITAL_COMMUNITY)
Admission: RE | Disposition: A | Discharge status: Home / Self Care | Payer: Self-pay | Source: Home / Self Care | Attending: Cardiovascular Disease

## 2019-07-26 ENCOUNTER — Ambulatory Visit (HOSPITAL_COMMUNITY)
Admission: RE | Admit: 2019-07-26 | Discharge: 2019-07-26 | Disposition: A | Discharge status: Home / Self Care | Payer: Medicare HMO | Attending: Cardiovascular Disease | Admitting: Cardiovascular Disease

## 2019-07-26 ENCOUNTER — Other Ambulatory Visit: Payer: Self-pay

## 2019-07-26 DIAGNOSIS — E039 Hypothyroidism, unspecified: Secondary | ICD-10-CM | POA: Insufficient documentation

## 2019-07-26 DIAGNOSIS — E785 Hyperlipidemia, unspecified: Secondary | ICD-10-CM | POA: Insufficient documentation

## 2019-07-26 DIAGNOSIS — I7300 Raynaud's syndrome without gangrene: Secondary | ICD-10-CM | POA: Diagnosis not present

## 2019-07-26 DIAGNOSIS — K219 Gastro-esophageal reflux disease without esophagitis: Secondary | ICD-10-CM | POA: Diagnosis not present

## 2019-07-26 DIAGNOSIS — I2510 Atherosclerotic heart disease of native coronary artery without angina pectoris: Secondary | ICD-10-CM | POA: Insufficient documentation

## 2019-07-26 DIAGNOSIS — Z7982 Long term (current) use of aspirin: Secondary | ICD-10-CM | POA: Insufficient documentation

## 2019-07-26 DIAGNOSIS — M349 Systemic sclerosis, unspecified: Secondary | ICD-10-CM | POA: Diagnosis not present

## 2019-07-26 DIAGNOSIS — Z79890 Hormone replacement therapy (postmenopausal): Secondary | ICD-10-CM | POA: Diagnosis not present

## 2019-07-26 DIAGNOSIS — I350 Nonrheumatic aortic (valve) stenosis: Secondary | ICD-10-CM | POA: Diagnosis present

## 2019-07-26 DIAGNOSIS — G4730 Sleep apnea, unspecified: Secondary | ICD-10-CM | POA: Diagnosis not present

## 2019-07-26 DIAGNOSIS — I119 Hypertensive heart disease without heart failure: Secondary | ICD-10-CM | POA: Insufficient documentation

## 2019-07-26 DIAGNOSIS — Z8249 Family history of ischemic heart disease and other diseases of the circulatory system: Secondary | ICD-10-CM | POA: Insufficient documentation

## 2019-07-26 DIAGNOSIS — F319 Bipolar disorder, unspecified: Secondary | ICD-10-CM | POA: Diagnosis not present

## 2019-07-26 DIAGNOSIS — Z79899 Other long term (current) drug therapy: Secondary | ICD-10-CM | POA: Diagnosis not present

## 2019-07-26 HISTORY — PX: RIGHT/LEFT HEART CATH AND CORONARY ANGIOGRAPHY: CATH118266

## 2019-07-26 LAB — POCT I-STAT 7, (LYTES, BLD GAS, ICA,H+H)
Acid-base deficit: 1 mmol/L (ref 0.0–2.0)
Bicarbonate: 24.6 mmol/L (ref 20.0–28.0)
Calcium, Ion: 1.43 mmol/L — ABNORMAL HIGH (ref 1.15–1.40)
HCT: 36 % (ref 36.0–46.0)
Hemoglobin: 12.2 g/dL (ref 12.0–15.0)
O2 Saturation: 95 %
Potassium: 4.1 mmol/L (ref 3.5–5.1)
Sodium: 141 mmol/L (ref 135–145)
TCO2: 26 mmol/L (ref 22–32)
pCO2 arterial: 44 mmHg (ref 32.0–48.0)
pH, Arterial: 7.355 (ref 7.350–7.450)
pO2, Arterial: 81 mmHg — ABNORMAL LOW (ref 83.0–108.0)

## 2019-07-26 LAB — POCT I-STAT EG7
Acid-Base Excess: 0 mmol/L (ref 0.0–2.0)
Bicarbonate: 25.6 mmol/L (ref 20.0–28.0)
Calcium, Ion: 1.41 mmol/L — ABNORMAL HIGH (ref 1.15–1.40)
HCT: 37 % (ref 36.0–46.0)
Hemoglobin: 12.6 g/dL (ref 12.0–15.0)
O2 Saturation: 76 %
Potassium: 4.1 mmol/L (ref 3.5–5.1)
Sodium: 143 mmol/L (ref 135–145)
TCO2: 27 mmol/L (ref 22–32)
pCO2, Ven: 46.2 mmHg (ref 44.0–60.0)
pH, Ven: 7.352 (ref 7.250–7.430)
pO2, Ven: 43 mmHg (ref 32.0–45.0)

## 2019-07-26 SURGERY — RIGHT/LEFT HEART CATH AND CORONARY ANGIOGRAPHY
Anesthesia: LOCAL

## 2019-07-26 MED ORDER — HEPARIN (PORCINE) IN NACL 1000-0.9 UT/500ML-% IV SOLN
INTRAVENOUS | Status: AC
  Filled 2019-07-26: qty 1000

## 2019-07-26 MED ORDER — SODIUM CHLORIDE 0.9% FLUSH: 3.0000 mL | Freq: Two times a day (BID) | INTRAVENOUS | Status: DC

## 2019-07-26 MED ORDER — HEPARIN SODIUM (PORCINE) 1000 UNIT/ML IJ SOLN
INTRAMUSCULAR | Status: DC | PRN
  Administered 2019-07-26: 4000 [IU] via INTRAVENOUS

## 2019-07-26 MED ORDER — VERAPAMIL HCL 2.5 MG/ML IV SOLN
INTRAVENOUS | Status: AC
  Filled 2019-07-26: qty 2

## 2019-07-26 MED ORDER — LIDOCAINE HCL (PF) 1 % IJ SOLN
INTRAMUSCULAR | Status: AC
  Filled 2019-07-26: qty 30

## 2019-07-26 MED ORDER — HEPARIN (PORCINE) IN NACL 1000-0.9 UT/500ML-% IV SOLN
INTRAVENOUS | Status: DC | PRN
  Administered 2019-07-26 (×2): 500 mL

## 2019-07-26 MED ORDER — VERAPAMIL HCL 2.5 MG/ML IV SOLN
INTRAVENOUS | Status: DC | PRN
  Administered 2019-07-26: 12:00:00 10 mL via INTRA_ARTERIAL

## 2019-07-26 MED ORDER — SODIUM CHLORIDE 0.9 % IV SOLN: 250.0000 mL | INTRAVENOUS | Status: DC | PRN

## 2019-07-26 MED ORDER — HEPARIN SODIUM (PORCINE) 1000 UNIT/ML IJ SOLN
INTRAMUSCULAR | Status: AC
  Filled 2019-07-26: qty 1

## 2019-07-26 MED ORDER — FENTANYL CITRATE (PF) 100 MCG/2ML IJ SOLN
INTRAMUSCULAR | Status: DC | PRN
  Administered 2019-07-26 (×2): 25 ug via INTRAVENOUS

## 2019-07-26 MED ORDER — MIDAZOLAM HCL 2 MG/2ML IJ SOLN
INTRAMUSCULAR | Status: AC
  Filled 2019-07-26: qty 2

## 2019-07-26 MED ORDER — SODIUM CHLORIDE 0.9% FLUSH: 3.0000 mL | INTRAVENOUS | Status: DC | PRN

## 2019-07-26 MED ORDER — HYDRALAZINE HCL 20 MG/ML IJ SOLN: 10.0000 mg | INTRAMUSCULAR | Status: DC | PRN

## 2019-07-26 MED ORDER — FENTANYL CITRATE (PF) 100 MCG/2ML IJ SOLN
INTRAMUSCULAR | Status: AC
  Filled 2019-07-26: qty 2

## 2019-07-26 MED ORDER — ACETAMINOPHEN 325 MG PO TABS: 650.0000 mg | ORAL_TABLET | ORAL | Status: DC | PRN

## 2019-07-26 MED ORDER — LIDOCAINE HCL (PF) 1 % IJ SOLN
INTRAMUSCULAR | Status: DC | PRN
  Administered 2019-07-26 (×2): 2 mL

## 2019-07-26 MED ORDER — ONDANSETRON HCL 4 MG/2ML IJ SOLN: 4.0000 mg | Freq: Four times a day (QID) | INTRAMUSCULAR | Status: DC | PRN

## 2019-07-26 MED ORDER — IOHEXOL 350 MG/ML SOLN
INTRAVENOUS | Status: DC | PRN
  Administered 2019-07-26: 12:00:00 55 mL via INTRA_ARTERIAL

## 2019-07-26 MED ORDER — SODIUM CHLORIDE 0.9 % WEIGHT BASED INFUSION
3.0000 mL/kg/h | INTRAVENOUS | Status: AC
  Administered 2019-07-26: 3 mL/kg/h via INTRAVENOUS

## 2019-07-26 MED ORDER — ASPIRIN 81 MG PO CHEW: 81.0000 mg | CHEWABLE_TABLET | ORAL | Status: DC

## 2019-07-26 MED ORDER — LABETALOL HCL 5 MG/ML IV SOLN: 10.0000 mg | INTRAVENOUS | Status: DC | PRN

## 2019-07-26 MED ORDER — MIDAZOLAM HCL 2 MG/2ML IJ SOLN
INTRAMUSCULAR | Status: DC | PRN
  Administered 2019-07-26 (×2): 1 mg via INTRAVENOUS

## 2019-07-26 MED ORDER — SODIUM CHLORIDE 0.9 % WEIGHT BASED INFUSION
1.0000 mL/kg/h | INTRAVENOUS | Status: DC
  Administered 2019-07-26: 1 mL/kg/h via INTRAVENOUS

## 2019-07-26 MED ORDER — SODIUM CHLORIDE 0.9 % WEIGHT BASED INFUSION: 1.0000 mL/kg/h | INTRAVENOUS | Status: DC

## 2019-07-26 SURGICAL SUPPLY — 16 items
CATH 5FR JL3.5 JR4 ANG PIG MP (CATHETERS) ×1 IMPLANT
CATH BALLN WEDGE 5F 110CM (CATHETERS) ×1 IMPLANT
CATH INFINITI 4FR RCB (CATHETERS) ×1 IMPLANT
CATH INFINITI MULTIPACK ANG 4F (CATHETERS) ×1 IMPLANT
DEVICE RAD COMP TR BAND LRG (VASCULAR PRODUCTS) ×1 IMPLANT
GLIDESHEATH SLEND SS 6F .021 (SHEATH) ×1 IMPLANT
GUIDEWIRE INQWIRE 1.5J.035X260 (WIRE) IMPLANT
INQWIRE 1.5J .035X260CM (WIRE) ×2
KIT HEART LEFT (KITS) ×2 IMPLANT
PACK CARDIAC CATHETERIZATION (CUSTOM PROCEDURE TRAY) ×2 IMPLANT
SHEATH GLIDE SLENDER 4/5FR (SHEATH) ×1 IMPLANT
SHEATH PROBE COVER 6X72 (BAG) ×1 IMPLANT
TRANSDUCER W/STOPCOCK (MISCELLANEOUS) ×2 IMPLANT
TUBING CIL FLEX 10 FLL-RA (TUBING) ×2 IMPLANT
WIRE EMERALD ST .035X260CM (WIRE) ×1 IMPLANT
WIRE HI TORQ VERSACORE-J 145CM (WIRE) ×1 IMPLANT

## 2019-07-26 NOTE — Research (Signed)
[place] Informed Consent   Subject Name: Natasha Taylor  Subject met inclusion and exclusion criteria.  The informed consent form, study requirements and expectations were reviewed with the subject and questions and concerns were addressed prior to the signing of the consent form.  The subject verbalized understanding of the trial requirements.  The subject agreed to participate in the [facility] trial and signed the informed consent at 1040 on 07/26/2019.  The informed consent was obtained prior to performance of any protocol-specific procedures for the subject.  A copy of the signed informed consent was given to the subject and a copy was placed in the subject's medical record.   Sukhman Martine

## 2019-07-26 NOTE — Interval H&P Note (Signed)
History and Physical Interval Note:  07/26/2019 11:30 AM  Natasha Taylor  has presented today for surgery, with the diagnosis of aortic stenosis.  The various methods of treatment have been discussed with the patient and family. After consideration of risks, benefits and other options for treatment, the patient has consented to  Procedure(s): RIGHT/LEFT HEART CATH AND CORONARY ANGIOGRAPHY (N/A) as a surgical intervention.  The patient's history has been reviewed, patient examined, no change in status, stable for surgery.  I have reviewed the patient's chart and labs.  Questions were answered to the patient's satisfaction.     Tonny Bollman

## 2019-07-26 NOTE — Progress Notes (Signed)
Discharge instructions reviewed with pt and pt's husband Nida Boatman. Verbalized understanding

## 2019-07-26 NOTE — Discharge Instructions (Signed)
Drink plenty of fluids for 48 hours and keep wrist elevated at heart level for 24 hours  Radial Site Care   This sheet gives you information about how to care for yourself after your procedure. Your health care provider may also give you more specific instructions. If you have problems or questions, contact your health care provider. What can I expect after the procedure? After the procedure, it is common to have:  Bruising and tenderness at the catheter insertion area. Follow these instructions at home: Medicines  Take over-the-counter and prescription medicines only as told by your health care provider. Insertion site care 1. Follow instructions from your health care provider about how to take care of your insertion site. Make sure you: ? Wash your hands with soap and water before you change your bandage (dressing). If soap and water are not available, use hand sanitizer. ? Remove your dressing as told by your health care provider. In 24 hours 2. Check your insertion site every day for signs of infection. Check for: ? Redness, swelling, or pain. ? Fluid or blood. ? Pus or a bad smell. ? Warmth. 3. Do not take baths, swim, or use a hot tub until your health care provider approves. 4. You may shower 24-48 hours after the procedure, or as directed by your health care provider. ? Remove the dressing and gently wash the site with plain soap and water. ? Pat the area dry with a clean towel. ? Do not rub the site. That could cause bleeding. 5. Do not apply powder or lotion to the site. Activity   1. For 24 hours after the procedure, or as directed by your health care provider: ? Do not flex or bend the affected arm. ? Do not push or pull heavy objects with the affected arm. ? Do not drive yourself home from the hospital or clinic. You may drive 24 hours after the procedure unless your health care provider tells you not to. ? Do not operate machinery or power tools. 2. Do not lift  anything that is heavier than 10 lb (4.5 kg), or the limit that you are told, until your health care provider says that it is safe.  For 4 days 3. Ask your health care provider when it is okay to: ? Return to work or school. ? Resume usual physical activities or sports. ? Resume sexual activity. General instructions  If the catheter site starts to bleed, raise your arm and put firm pressure on the site. If the bleeding does not stop, get help right away. This is a medical emergency.  If you went home on the same day as your procedure, a responsible adult should be with you for the first 24 hours after you arrive home.  Keep all follow-up visits as told by your health care provider. This is important. Contact a health care provider if:  You have a fever.  You have redness, swelling, or yellow drainage around your insertion site. Get help right away if:  You have unusual pain at the radial site.  The catheter insertion area swells very fast.  The insertion area is bleeding, and the bleeding does not stop when you hold steady pressure on the area.  Your arm or hand becomes pale, cool, tingly, or numb. These symptoms may represent a serious problem that is an emergency. Do not wait to see if the symptoms will go away. Get medical help right away. Call your local emergency services (911 in the [place] Do   not drive yourself to the hospital. Summary  After the procedure, it is common to have bruising and tenderness at the site.  Follow instructions from your health care provider about how to take care of your radial site wound. Check the wound every day for signs of infection.  Do not lift anything that is heavier than 10 lb (4.5 kg), or the limit that you are told, until your health care provider says that it is safe. This information is not intended to replace advice given to you by your health care provider. Make sure you discuss any questions you have with your health care  provider. Document Revised: 04/14/2017 Document Reviewed: 04/14/2017 Elsevier Patient Education  2020 [employer]  

## 2019-08-07 ENCOUNTER — Encounter: Payer: Self-pay | Admitting: Thoracic Surgery (Cardiothoracic Vascular Surgery)

## 2019-08-07 ENCOUNTER — Other Ambulatory Visit: Payer: Self-pay | Admitting: Thoracic Surgery (Cardiothoracic Vascular Surgery)

## 2019-08-07 ENCOUNTER — Other Ambulatory Visit: Payer: Self-pay | Admitting: *Deleted

## 2019-08-07 ENCOUNTER — Institutional Professional Consult (permissible substitution): Payer: Medicare HMO | Admitting: Thoracic Surgery (Cardiothoracic Vascular Surgery)

## 2019-08-07 ENCOUNTER — Other Ambulatory Visit: Payer: Self-pay

## 2019-08-07 VITALS — BP 140/90 | HR 88 | Temp 97.6°F | Resp 20 | Ht 63.0 in | Wt 185.0 lb

## 2019-08-07 DIAGNOSIS — I350 Nonrheumatic aortic (valve) stenosis: Secondary | ICD-10-CM

## 2019-08-07 NOTE — Progress Notes (Signed)
[facility]  MULTIDISCIPLINARY HEART VALVE CLINIC  CARDIOTHORACIC SURGERY CONSULTATION REPORT  Referring Provider is Crenshaw, Madolyn Frieze, MD PCP is Drosinis, Leonia Reader, PA-C  Chief Complaint  Patient presents with  . Aortic Stenosis    Surgical eval, Cardic Cath 07/26/19, ECHO 01/05/19    HPI:  Patient is a 59 year old obese female with bicuspid aortic valve, hypertension, hyperlipidemia, GE reflux disease, scleroderma, rheumatoid arthritis, and obstructive sleep apnea on CPAP who has been referred for surgical consultation to discuss treatment options for management of severe symptomatic aortic stenosis.  Patient states that she has a long history of chronic shortness of breath.  She was first noted to have a heart murmur approximately 4 or 5 years ago.  Echocardiogram revealed possible bicuspid aortic valve with moderate aortic stenosis.  She has been followed carefully ever since by Dr. Jens Som.  Patient has developed progressive symptoms of exertional shortness of breath and most recent transthoracic echocardiogram performed January 05, 2019 revealed preserved left ventricular systolic function with progression and severity of aortic valve disease with peak velocity across aortic valve measured greater than 4.1 m/s corresponding to mean transvalvular gradient estimated 40 mmHg.  The patient underwent diagnostic cardiac catheterization Jul 26, 2019 which confirmed the presence of aortic stenosis and revealed nonobstructive coronary artery disease with stable mild proximal stenosis of the left anterior descending coronary artery, unchanged from catheterization performed in 2017.  Cardiothoracic surgical consultation was requested.  Patient is married and lives locally in [place] with her husband.  She has been disabled for more than 20 years having previously worked for [employer].  She originally went out on disability when she first developed symptoms related to  scleroderma.  She lives a sedentary lifestyle.  She is chronically limited by exertional shortness of breath which reportedly has gotten worse recently.  She now gets short of breath with low-level activity and when laying flat in bed.  She reports occasional brief episodes of tightness across her chest.  She has occasional palpitations and lower extremity edema.  She has had mild dizziness without syncope.  She has stable chronic symptoms of dysphagia related to scleroderma.  Occasionally food gets stuck because of esophageal dysmotility but she has not had significant stricture formation to her knowledge.  She has chronic dry cough.  She uses CPAP for sleep apnea at night.  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  She is not very active physically.  She uses a cane for long distance walking.  She has recently developed some mild blurriness of her vision in both eyes since she started taking oral Plaquenil for her rheumatoid arthritis.    Past Medical History:  Diagnosis Date  . Aortic stenosis   . Asthma    PMH  . Bipolar 1 disorder (HCC)   . GERD (gastroesophageal reflux disease)   . H/O blood clots   . Headache    migraines  . Heart murmur   . History of hiatal hernia   . Hyperlipidemia   . Hypertension   . Hypothyroidism   . Insomnia   . Painful orthopaedic hardware (HCC)   . Raynaud disease   . Rheumatoid arthritis (HCC)   . Scleroderma (HCC)   . Sleep apnea    wears cpap  . Wears glasses   . Wears partial dentures     Past Surgical History:  Procedure Laterality Date  . ABDOMINAL HYSTERECTOMY     TAH  .  BUNIONECTOMY    . CARDIAC CATHETERIZATION N/A 04/24/2015   Procedure: Right/Left Heart Cath and Coronary Angiography;  Surgeon: Tonny Bollman, MD;  Location: [facility] INVASIVE CV LAB;  Service: Cardiovascular;  Laterality: N/A;  . CHOLECYSTECTOMY    . COLONOSCOPY WITH ESOPHAGOGASTRODUODENOSCOPY (EGD)    . HARDWARE  REMOVAL Right 01/20/2018   Procedure: Removal of deep implants right medial cuneiform, 1st metatarsal and 2nd metatarsal;  Surgeon: Toni Arthurs, MD;  Location: [facility] OR;  Service: Orthopedics;  Laterality: Right;   . MULTIPLE TOOTH EXTRACTIONS    . RIGHT/LEFT HEART CATH AND CORONARY ANGIOGRAPHY N/A 07/26/2019   Procedure: RIGHT/LEFT HEART CATH AND CORONARY ANGIOGRAPHY;  Surgeon: Tonny Bollman, MD;  Location: [facility] INVASIVE CV LAB;  Service: Cardiovascular;  Laterality: N/A;  . sympathomyectomies  2000   Bilateral wrists    Family History  Adopted: Yes  Problem Relation Age of Onset  . Heart attack Sister 63       deceased    Social History   Socioeconomic History  . Marital status: Legally Separated    Spouse name: Not on file  . Number of children: Not on file  . Years of education: Not on file  . Highest education level: Not on file  Occupational History  . Not on file  Tobacco Use  . Smoking status: Never Smoker  . Smokeless tobacco: Never Used  Substance and Sexual Activity  . Alcohol use: No    Alcohol/week: 0.0 standard drinks  . Drug use: No  . Sexual activity: Not on file  Other Topics Concern  . Not on file  Social History Narrative  . Not on file   Social Determinants of Health   Financial Resource Strain:   . Difficulty of Paying Living Expenses:   Food Insecurity:   . Worried About Programme researcher, broadcasting/film/video in the Last Year:   . Barista in the Last Year:   Transportation Needs:   . Freight forwarder (Medical):   Marland Kitchen Lack of Transportation (Non-Medical):   Physical Activity:   . Days of Exercise per Week:   . Minutes of Exercise per Session:   Stress:   . Feeling of Stress :   Social Connections:   . Frequency of Communication with Friends and Family:   . Frequency of Social Gatherings with Friends and Family:   . Attends Religious Services:   . Active Member of Clubs or Organizations:   . Attends Banker Meetings:   Marland Kitchen Marital  Status:   Intimate Partner Violence:   . Fear of Current or Ex-Partner:   . Emotionally Abused:   Marland Kitchen Physically Abused:   . Sexually Abused:     Current Outpatient Medications  Medication Sig Dispense Refill  . amLODipine (NORVASC) 10 MG tablet TAKE 1 TABLET(10 MG) BY MOUTH DAILY (Patient taking differently: Take 10 mg by mouth daily. ) 90 tablet 0  . aspirin EC 81 MG tablet Take 81 mg by mouth daily.    Marland Kitchen atorvastatin (LIPITOR) 40 MG tablet Take 40 mg by mouth daily.     . carboxymethylcellulose (REFRESH PLUS) 0.5 % SOLN Place 1 drop into both eyes daily as needed (Dry eye).    . cholecalciferol (VITAMIN D) 25 MCG (1000 UNIT) tablet Take 1,000 Units by mouth 2 (two) times daily.     . diclofenac (CATAFLAM) 50 MG tablet Take 50 mg by mouth 2 (two) times daily as needed for pain.    . divalproex (DEPAKOTE ER)  500 MG 24 hr tablet Take 500 mg by mouth daily.    Marland Kitchen doxycycline (VIBRAMYCIN) 100 MG capsule Take 100 mg by mouth as needed (acne break outs).     . hydroxychloroquine (PLAQUENIL) 200 MG tablet Take 200 mg by mouth daily.    . lansoprazole (PREVACID) 30 MG capsule Take 30 mg by mouth 2 (two) times daily.     Marland Kitchen levothyroxine (SYNTHROID) 75 MCG tablet Take 75 mcg by mouth daily before breakfast.     . Melatonin 5 MG CAPS Take 5 mg by mouth at bedtime as needed (for sleep).    . metoprolol succinate (TOPROL-XL) 50 MG 24 hr tablet Take 50 mg by mouth daily.     . Multiple Vitamin (MULTIVITAMIN WITH MINERALS) TABS tablet Take 1 tablet by mouth daily.    . NURTEC 75 MG TBDP Take 1 tablet by mouth daily as needed for headache.    . Omega-3 Fatty Acids (EQL OMEGA 3 FISH OIL) 1000 MG CAPS Take 1,000 mg by mouth 2 (two) times daily.     Marland Kitchen tretinoin (RETIN-A) 0.05 % cream Apply 1 application topically daily as needed (Outbreak).     . vitamin B-12 (CYANOCOBALAMIN) 1000 MCG tablet Take 1,000 mcg by mouth daily.      Current Facility-Administered Medications  Medication Dose Route Frequency  Provider Last Rate Last Admin  . sodium chloride flush (NS) 0.9 % injection 3 mL  3 mL Intravenous Q12H Crenshaw, Madolyn Frieze, MD        Allergies  Allergen Reactions  . Morphine And Related Hives and Itching  . Gabapentin Itching and Other (See Comments)    Brain fog.  . Meperidine Itching      Review of Systems:   General:  normal appetite, decreased energy, no weight gain, no weight loss, no fever  Cardiac:  no chest pain with exertion, occasional brief episodes chest pain at rest, +SOB with exertion, no resting SOB, no PND, + orthopnea, + palpitations, no arrhythmia, no atrial fibrillation, + LE edema, + dizzy spells, no syncope  Respiratory:  + shortness of breath, no home oxygen, no productive cough, + intermittent dry cough, no bronchitis, no wheezing, no hemoptysis, no asthma, no pain with inspiration or cough, + sleep apnea, + CPAP at night  GI:   + difficulty swallowing, + reflux, + frequent heartburn, + hiatal hernia, no abdominal pain, + constipation, + diarrhea, no hematochezia, no hematemesis, no melena  GU:   no dysuria,  no frequency, no urinary tract infection, no hematuria, no kidney stones, no kidney disease  Vascular:  no pain suggestive of claudication, no pain in feet, no leg cramps, no varicose veins, no DVT, no non-healing foot ulcer  Neuro:   no stroke, no TIA's, no seizures, no headaches, no temporary blindness one eye,  no slurred speech, no peripheral neuropathy, + chronic pain, + instability of gait, no memory/cognitive dysfunction  Musculoskeletal: + arthritis, + joint swelling, no myalgias, + difficulty walking, decreased mobility   Skin:   no rash, no itching, no skin infections, no pressure sores or ulcerations  Psych:   + anxiety, no depression, no nervousness, no unusual recent stress  Eyes:   + blurry vision, no floaters, no recent vision changes, + wears glasses or contacts  ENT:   no hearing loss, no loose or painful teeth, partial upper dentures, last saw  dentist 2019  Hematologic:  no easy bruising, no abnormal bleeding, no clotting disorder, no frequent epistaxis  Endocrine:  no diabetes, does not check CBG's at home           Physical Exam:   BP 140/90   Pulse 88   Temp 97.6 F (36.4 C) (Skin)   Resp 20   Ht  (1.6 m)   Wt 185 lb (83.9 kg)   SpO2 95% Comment: RA  BMI 32.77 kg/m   General:  Moderately obese, appears older than stated age    HEENT:  Unremarkable   Neck:   no JVD, no bruits, no adenopathy   Chest:   clear to auscultation, symmetrical breath sounds, no wheezes, no rhonchi   CV:   RRR, grade III/VI crescendo/decrescendo murmur heard best at RSB,  no diastolic murmur  Abdomen:  soft, non-tender, no masses   Extremities:  warm, well-perfused, pulses diminished, no LE edema  Rectal/GU  Deferred  Neuro:   Grossly non-focal and symmetrical throughout  Skin:   Clean and dry, no rashes, no breakdown   Diagnostic Tests:  ECHOCARDIOGRAM REPORT       Patient Name:  Natasha Taylor Date of Exam: 01/05/2019  Medical Rec #: [id number]    Height:    63.0 in  Accession #:  [id number]    Weight:    190.0 lb  Date of Birth: 02-19-1961     BSA:     1.89 m  Patient Age:  58 years     BP:      135/104 mmHg  Patient Gender: F        HR:      70 bpm.  Exam Location: [address]   Procedure: 2D Echo, Cardiac Doppler and Color Doppler   Indications:  I35.0 Aortic Stenosis    History:    Patient has prior history of Echocardiogram examinations,  most         recent 01/03/2018. CAD Signs/Symptoms:Murmur and         Dizziness/Lightheadedness Risk Factors:Sleep Apnea and         Hypertension.    Sonographer:  Clearence Ped RCS  Referring Phys: [id number] BRIAN S CRENSHAW   IMPRESSIONS    1. Left ventricular ejection fraction, by visual estimation, is 55 to  60%. The left ventricle has normal function. Left ventricular septal  wall  thickness was moderately increased. There is moderately increased left  ventricular hypertrophy.  2. Global right ventricle has normal systolic function.The right  ventricular size is normal. No increase in right ventricular wall  thickness.  3. Left atrial size was mildly dilated.  4. Right atrial size was normal.  5. The mitral valve is normal in structure. Trace mitral valve  regurgitation.  6. The tricuspid valve is normal in structure. Tricuspid valve  regurgitation is mild.  7. The aortic valve has an indeterminant number of cusps Aortic valve  regurgitation is mild by color flow Doppler. Severe aortic valve stenosis.  8. There is Moderate thickening of the aortic valve.  9. There is Severe calcifcation of the aortic valve.  10. AV calcified with restricted motion Inderterminate number of leaflets  Previous echo done 01/03/18 suggested moderate AS with mean gradient 22  peak 44 mmHg Suprasternal CW signal on this study suggests severe AS with  mean gradient 40 mmHg and peak 70  mmhg.  11. The pulmonic valve was grossly normal. Pulmonic valve regurgitation is  mild by color flow Doppler.   FINDINGS  Left Ventricle: Left ventricular ejection fraction, by visual estimation,  is 55 to 60%. The left ventricle  has normal function. Left ventricular  septal wall thickness was moderately increased. There is moderately  increased left ventricular hypertrophy.   Right Ventricle: The right ventricular size is normal. No increase in  right ventricular wall thickness. Global RV systolic function is has  normal systolic function.   Left Atrium: Left atrial size was mildly dilated.   Right Atrium: Right atrial size was normal in size   Pericardium: There is no evidence of pericardial effusion.   Mitral Valve: The mitral valve is normal in structure. There is mild  thickening of the mitral valve leaflet(s). Trace mitral valve  regurgitation.   Tricuspid Valve: The  tricuspid valve is normal in structure. Tricuspid  valve regurgitation is mild by color flow Doppler.   Aortic Valve: The aortic valve has an indeterminant number of cusps. There  is Moderate thickening and Severe calcifcation of the aortic valve. Aortic  valve regurgitation is mild by color flow Doppler. Aortic regurgitation  PHT measures 494 msec. Severe  aortic stenosis is present. There is Moderate thickening of the aortic  valve. Severe calcifcation. Aortic valve mean gradient measures 40.0 mmHg.  Aortic valve peak gradient measures 69.6 mmHg. Aortic valve area, by VTI  measures 0.70 cm. AV calcified  with restricted motion Inderterminate number of leaflets Previous echo  done 01/03/18 suggested moderate AS with mean gradient 22 peak 44 mmHg  Suprasternal CW signal on this study suggests severe AS with mean gradient  40 mmHg and peak 70 mmhg.   Pulmonic Valve: The pulmonic valve was grossly normal. Pulmonic valve  regurgitation is mild by color flow Doppler.   Aorta: The aortic root is normal in size and structure.   IAS/Shunts: No atrial level shunt detected by color flow Doppler.      LEFT VENTRICLE  PLAX 2D  LVIDd:     3.57 cm Diastology  LVIDs:     2.37 cm LV e' lateral:  5.33 cm/s  LV PW:     1.48 cm LV E/e' lateral: 12.1  LV IVS:    1.60 cm LV e' medial:  4.68 cm/s  LVOT diam:   2.00 cm LV E/e' medial: 13.8  LV SV:     34 ml  LV SV Index:  16.96  LVOT Area:   3.14 cm     RIGHT VENTRICLE  RV Basal diam: 3.39 cm  RV S prime:   13.80 cm/s  TAPSE (M-mode): 2.4 cm   LEFT ATRIUM       Index    RIGHT ATRIUM      Index  LA diam:    3.90 cm 2.06 cm/m RA Area:   12.30 cm  LA Vol (A2C):  42.5 ml 22.46 ml/m RA Volume:  24.70 ml 13.06 ml/m  LA Vol (A4C):  39.9 ml 21.09 ml/m  LA Biplane Vol: 43.1 ml 22.78 ml/m  AORTIC VALVE  AV Area (Vmax):  0.72 cm  AV Area (Vmean):  0.75 cm  AV Area (VTI):    0.70 cm  AV Vmax:      417.00 cm/s  AV Vmean:     293.000 cm/s  AV VTI:      1.000 m  AV Peak Grad:   69.6 mmHg  AV Mean Grad:   40.0 mmHg  LVOT Vmax:     95.30 cm/s  LVOT Vmean:    70.400 cm/s  LVOT VTI:     0.222 m  LVOT/AV VTI ratio: 0.22  AI PHT:      494 msec  AORTA  Ao Root diam: 2.80 cm   MITRAL VALVE  MV Area (PHT): 3.08 cm       SHUNTS  MV PHT:    71.34 msec      Systemic VTI: 0.22 m  MV Decel Time: 246 msec       Systemic Diam: 2.00 cm  MV E velocity: 64.50 cm/s 103 cm/s  MV A velocity: 84.40 cm/s 70.3 cm/s  MV E/A ratio: 0.76    1.5     Charlton Haws MD  Electronically signed by Charlton Haws MD  Signature Date/Time: 01/05/2019/4:20:27 PM      EKG: NSR w/out significant AV conduction delay       RIGHT/LEFT HEART CATH AND CORONARY ANGIOGRAPHY  Conclusion  1.  Mild nonobstructive coronary artery disease primarily affecting the proximal LAD, unchanged from the previous cardiac catheterization study in 2017 2.  Severe aortic stenosis with a mean gradient of 32 mmHg 3.  Mildly elevated pulmonary artery pressure, likely secondary to left heart disease  Recommendation: Cardiac surgical evaluation for consideration of aortic valve replacement.  Indications  Severe aortic stenosis [I35.0 (ICD-10-CM)]  Procedural Details  Technical Details INDICATION: Severe symptomatic aortic stenosis (Stage D1 disease).  PROCEDURAL DETAILS: There was an indwelling IV in a right antecubital vein. Using normal sterile technique, the IV was changed out for a 5 Fr brachial sheath over a 0.018 inch wire. The right wrist was then prepped, draped, and anesthetized with 1% lidocaine. Using ultrasound guidance, a 5/6 French Slender sheath was placed in the right radial artery.  Ultrasound images are captured and stored in the patient's chart.  Intra-arterial verapamil was administered through the radial artery  sheath. IV heparin was administered after a JR4 catheter was advanced into the central aorta. A Swan-Ganz catheter was used for the right heart catheterization. Standard protocol was followed for recording of right heart pressures and sampling of oxygen saturations. Fick cardiac output was calculated. Standard Judkins catheters were used for selective coronary angiography.  4 French catheters were used because of resistance with 5 French catheters and a small caliber radial artery.  LV pressure is recorded and an aortic valve pullback is performed. There were no immediate procedural complications. The patient was transferred to the post catheterization recovery area for further monitoring.    Estimated blood loss <50 mL.   During this procedure medications were administered to achieve and maintain moderate conscious sedation while the patient's heart rate, blood pressure, and oxygen saturation were continuously monitored and I was present face-to-face 100% of this time.  Medications (Filter: Administrations occurring from 07/26/19 1112 to 07/26/19 1215) (important)  Continuous medications are totaled by the amount administered until 07/26/19 1215.  fentaNYL (SUBLIMAZE) injection (mcg) Total dose:  50 mcg Date/Time  Rate/Dose/Volume Action  07/26/19 1128  25 mcg Given  1138  25 mcg Given    midazolam (VERSED) injection (mg) Total dose:  2 mg Date/Time  Rate/Dose/Volume Action  07/26/19 1128  1 mg Given  1138  1 mg Given    Heparin (Porcine) in NaCl 1000-0.9 UT/500ML-% SOLN (mL) Total volume:  1,000 mL Date/Time  Rate/Dose/Volume Action  07/26/19 1129  500 mL Given  1129  500 mL Given    lidocaine (PF) (XYLOCAINE) 1 % injection (mL) Total volume:  4 mL Date/Time  Rate/Dose/Volume Action  07/26/19 1137  2 mL Given  1140  2 mL Given    Radial Cocktail/Verapamil only (mL) Total volume:  10 mL Date/Time  Rate/Dose/Volume Action  07/26/19  1142  10 mL Given    heparin sodium (porcine)  injection (Units) Total dose:  4,000 Units Date/Time  Rate/Dose/Volume Action  07/26/19 1154  4,000 Units Given    iohexol (OMNIPAQUE) 350 MG/ML injection (mL) Total volume:  55 mL Date/Time  Rate/Dose/Volume Action  07/26/19 1205  55 mL Given    Sedation Time  Sedation Time Physician-1: 36 minutes 2 seconds  Contrast  Medication Name Total Dose  iohexol (OMNIPAQUE) 350 MG/ML injection 55 mL    Radiation/Fluoro  Fluoro time: 5.9 (min) DAP: 11883 (mGycm2) Cumulative Air Kerma: 210 (mGy)  Coronary Findings  Diagnostic Dominance: Right Left Main  Vessel is angiographically normal. The left main is patent with no obstruction. It divides into the LAD and left circumflex.  Left Anterior Descending  There is mild diffuse disease throughout the vessel. The LAD covers a large territory. The vessel wraps around the LV apex. There is mild nonobstructive disease in the proximal LAD but no other significant stenosis.  Prox LAD lesion 25% stenosed  Prox LAD lesion is 25% stenosed. There is a mild 25% shelflike plaque in the proximal LAD. This is unchanged from the previous cardiac catheterization study in 2017. The LAD is patent throughout its course with no evidence of high-grade stenosis.  Left Circumflex  The vessel exhibits minimal luminal irregularities. Angiographically normal vessel with no significant obstruction.  Right Coronary Artery  The vessel exhibits minimal luminal irregularities. Dominant RCA, medium in caliber, no significant obstruction.  Intervention  No interventions have been documented. Coronary Diagrams  Diagnostic Dominance: Right  Intervention  Implants   No implant documentation for this case.  Syngo Images  Show images for CARDIAC CATHETERIZATION  Images on Long Term Storage  Show images for Kristasia, Enke to Procedure Log  Procedure Log    Hemo Data   Most Recent Value  Fick Cardiac Output 6.57 L/min  Fick Cardiac Output Index  3.48 (L/min)/BSA  Aortic Mean Gradient 31.73 mmHg  Aortic Peak Gradient 30 mmHg  Aortic Valve Area 1.19  Aortic Value Area Index 0.63 cm2/BSA  RA A Wave 11 mmHg  RA V Wave 9 mmHg  RA Mean 7 mmHg  RV Systolic Pressure 42 mmHg  RV Diastolic Pressure 5 mmHg  RV EDP 12 mmHg  PA Systolic Pressure 42 mmHg  PA Diastolic Pressure 19 mmHg  PA Mean 29 mmHg  PW A Wave 22 mmHg  PW V Wave 24 mmHg  PW Mean 18 mmHg  AO Systolic Pressure 138 mmHg  AO Diastolic Pressure 74 mmHg  AO Mean 99 mmHg  LV Systolic Pressure 170 mmHg  LV Diastolic Pressure 10 mmHg  LV EDP 19 mmHg  AOp Systolic Pressure 139 mmHg  AOp Diastolic Pressure 73 mmHg  AOp Mean Pressure 99 mmHg  LVp Systolic Pressure 169 mmHg  LVp Diastolic Pressure 10 mmHg  LVp EDP Pressure 18 mmHg  QP/QS 1  TPVR Index 8.35 HRUI  TSVR Index 28.49 HRUI  PVR SVR Ratio 0.12  TPVR/TSVR Ratio 0.29    CT ANGIOGRAPHY CHEST WITH CONTRAST  TECHNIQUE: Multidetector CT imaging of the chest was performed using the standard protocol during bolus administration of intravenous contrast. Multiplanar CT image reconstructions and MIPs were obtained to evaluate the vascular anatomy.  CONTRAST:  ISOVUE-370 IOPAMIDOL (ISOVUE-370) INJECTION 76%  COMPARISON:  Prior CT of the chest without contrast on 12/03/2011 at [facility]  FINDINGS: Cardiovascular: The thoracic aorta is well opacified and shows normal caliber without evidence  of aneurysmal disease. The aortic root measures 2.9 cm. The ascending thoracic aorta measures 3.2 cm. The proximal arch measures 2.8 cm. The distal arch measures 3.0 cm. The descending thoracic aorta measures 2.4 cm.  There are some calcifications associated with the aortic valve. The heart size appears normal. No significant calcified coronary artery plaque identified. No pericardial fluid is seen.  Mediastinum/Nodes: No enlarged mediastinal, hilar, or axillary lymph nodes. Thyroid gland, trachea,  and esophagus demonstrate no significant findings.  Lungs/Pleura: Stable 3 mm noncalcified peripheral nodule in the left lower lobe. Stable 3 mm noncalcified right middle lobe nodule. There is no evidence of pulmonary edema, consolidation, pneumothorax or pleural fluid.  Upper Abdomen: 2.3 cm cyst is present in the upper pole of the spleen.  Musculoskeletal: No chest wall abnormality. No acute or significant osseous findings.  Review of the MIP images confirms the above findings.  IMPRESSION: 1. Normal caliber thoracic aorta without evidence of aneurysmal disease. 2. Some calcifications are seen associated with the aortic valve. 3. Stable 3 mm left lower lobe and right middle lobe pulmonary nodules since a prior study in 2013.   Electronically Signed   By: Irish Lack M.D.   On: 02/05/2017 16:04      Impression:  Patient has stage D severe symptomatic aortic stenosis.  She describes a long history of progressive symptoms of exertional shortness of breath and fatigue that are likely multifactorial but consistent with worsening symptoms of chronic diastolic congestive heart failure, [place] Heart Association functional class III.  She has also been having increasing dizzy spells and atypical chest pain.  I have personally reviewed the patient's most recent transthoracic echocardiogram and her recent diagnostic cardiac catheterization.  Echocardiogram performed October 2020 revealed severe aortic stenosis with normal left ventricular systolic function.  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  Peak velocity across aortic valve measured greater than 4.1 m/s corresponding to mean transvalvular gradient estimated 40 mmHg.  DVI was reported 0.22 and aortic valve area calculated only 0.70 cm.  Diagnostic cardiac catheterization revealed stable nonobstructive coronary artery disease and confirmed the presence of aortic stenosis with mean transvalvular  gradient measured 32 mmHg corresponding to aortic valve area calculated 1.1 cm.  There was mild pulmonary hypertension.  Baseline EKG reveals sinus rhythm without significant AV conduction delay and CT scan of the chest performed in 2018 revealed no significant aneurysmal enlargement of the ascending aorta.  I agree the patient would benefit from aortic valve replacement.  Risks associated with conventional surgery would be relatively low although slightly elevated by the patient's comorbid medical problems and physical deconditioning.  She may be an acceptable candidate for minimally invasive approach for surgery.     Plan:  The patient and her husband were counseled at length regarding treatment alternatives for management of severe symptomatic aortic stenosis. Alternative approaches such as conventional aortic valve replacement, transcatheter aortic valve replacement, and continued medical therapy without intervention were compared and contrasted at length.  The risks associated with conventional surgical aortic valve replacement were discussed in detail, as were expectations for post-operative convalescence, alternative surgical approaches, prosthetic valve choices, and the presence or absence of other concomitant conditions which might require intervention.  Issues specific to transcatheter aortic valve replacement were discussed including questions about long term valve durability, the potential for paravalvular leak, possible increased risk of need for permanent pacemaker placement, the suitability of the patient's surgical anatomy, and other potential technical complications related to the procedure itself.  Long-term prognosis without surgical intervention was discussed.  All treatment options were discussed in the context of the patient's own specific clinical presentation, past medical history, long-term prognosis and life expectancy.  All of their questions have been addressed.  The patient is  interested in proceeding with surgery in the near future.  We tentatively plan for aortic valve replacement using a bioprosthetic tissue valve on September 07, 2019.  The patient will return to our office for follow-up prior to surgery on September 04, 2019.  Prior to that she will undergo cardiac gated CT angiogram of the heart to further evaluate the functional anatomy and size of the aortic root as well as CT angiography to evaluate the feasibility of peripheral cannulation for surgery.  I have suggested that she see her local dentist for routine follow-up and cleaning prior to surgery.  All questions answered.     I spent in excess of 90 minutes during the conduct of this office consultation and >50% of this time involved direct face-to-face encounter with the patient for counseling and/or coordination of their care.     Valentina Gu. Roxy Manns, MD 08/07/2019 5:13 PM

## 2019-08-07 NOTE — Patient Instructions (Signed)
Stop taking fish oil and Plaquenil 2 weeks prior to surgery  Continue taking all other medications without change through the day before surgery.  Make sure to bring all of your medications with you when you come for your Pre-Admission Testing appointment at [facility] Short-Stay Department.  Have nothing to eat or drink after midnight the night before surgery.  On the morning of surgery take only Synthroid (levothyroxine) with a sip of water.  At your appointment for Pre-Admission Testing at the [facility] Short-Stay Department you will be asked to sign permission forms for your upcoming surgery.  By definition your signature on these forms implies that you and/or your designee provide full informed consent for your planned surgical procedure(s), that alternative treatment options have been discussed, that you understand and accept any and all potential risks, and that you have some understanding of what to expect for your post-operative convalescence.  For any major cardiac surgical procedure potential operative risks include but are not limited to at least some risk of death, stroke or other neurologic complication, myocardial infarction, congestive heart failure, respiratory failure, renal failure, bleeding requiring blood transfusion and/or reexploration, irregular heart rhythm, heart block or bradycardia requiring permanent pacemaker, pneumonia, pericardial effusion, pleural effusion, wound infection, pulmonary embolus or other thromboembolic complication, chronic pain, or other complications related to the specific procedure(s) performed.  Please call to schedule a follow-up appointment in our office prior to surgery if you have any unresolved questions about your planned surgical procedure, the associated risks, alternative treatment options, and/or expectations for your post-operative recovery.

## 2019-08-08 ENCOUNTER — Encounter: Payer: Self-pay | Admitting: *Deleted

## 2019-08-08 ENCOUNTER — Other Ambulatory Visit: Payer: Self-pay | Admitting: *Deleted

## 2019-08-08 DIAGNOSIS — I350 Nonrheumatic aortic (valve) stenosis: Secondary | ICD-10-CM

## 2019-08-14 ENCOUNTER — Telehealth (HOSPITAL_COMMUNITY): Payer: Self-pay | Admitting: *Deleted

## 2019-08-14 NOTE — Telephone Encounter (Signed)

## 2019-08-15 ENCOUNTER — Other Ambulatory Visit: Payer: Self-pay

## 2019-08-15 ENCOUNTER — Ambulatory Visit (HOSPITAL_COMMUNITY)
Admission: RE | Admit: 2019-08-15 | Discharge: 2019-08-15 | Disposition: A | Discharge status: Home / Self Care | Payer: Medicare HMO | Source: Ambulatory Visit | Attending: Thoracic Surgery (Cardiothoracic Vascular Surgery) | Admitting: Thoracic Surgery (Cardiothoracic Vascular Surgery)

## 2019-08-15 DIAGNOSIS — I350 Nonrheumatic aortic (valve) stenosis: Secondary | ICD-10-CM | POA: Insufficient documentation

## 2019-08-15 MED ORDER — IOHEXOL 350 MG/ML SOLN
100.0000 mL | Freq: Once | INTRAVENOUS | Status: AC | PRN
  Administered 2019-08-15: 100 mL via INTRAVENOUS

## 2019-09-01 NOTE — Pre-Procedure Instructions (Signed)
Your procedure is scheduled on Thursday September 07, 2019 from 07:30 AM- 1:53 PM.  Report to [facility] Main Entrance "A" at 05:30 A.M., and check in at the Admitting office.  Call this number if you have problems the morning of surgery:  [phone]  Call [phone] if you have any questions prior to your surgery date Monday-Friday 8am-4pm.    Remember:  Do not eat or drink after midnight the night before your surgery.    Take these medicines the morning of surgery with A SIP OF WATER: amLODipine (NORVASC) atorvastatin (LIPITOR) divalproex (DEPAKOTE ER) lansoprazole (PREVACID) levothyroxine (SYNTHROID) metoprolol succinate (TOPROL-XL)     carboxymethylcellulose (REFRESH PLUS) eye drops  IF NEEDED: NURTEC  *Follow your surgeon's instructions on when to stop Aspirin.  If no instructions were given by your surgeon then you will need to call the office to get those instructions.     As of today, STOP taking any NSAIDs such as diclofenac (CATAFLAM), Aleve, Naproxen, Ibuprofen, Motrin, Advil, Goody's, BC's, all herbal medications, fish oil, and all vitamins.          The Morning of Surgery:            Do not wear jewelry, make up, or nail polish.            Do not wear lotions, powders, perfumes, or deodorant.            Men may shave face and neck.            Do not bring valuables to the hospital.            [facility] is not responsible for any belongings or valuables.  Do NOT Smoke (Tobacco/Vapping) or drink Alcohol 24 hours prior to your procedure.  If you use a CPAP at night, you may bring all equipment for your overnight stay.   Contacts, glasses, dentures or bridgework may not be worn into surgery.      For patients admitted to the hospital, discharge time will be determined by your treatment team.   Patients discharged the day of surgery will not be allowed to drive home, and someone needs to stay with them for 24 hours.    Special instructions:   [place]-  Preparing For Surgery  Before surgery, you can play an important role. Because skin is not sterile, your skin needs to be as free of germs as possible. You can reduce the number of germs on your skin by washing with CHG (chlorahexidine gluconate) Soap before surgery.  CHG is an antiseptic cleaner which kills germs and bonds with the skin to continue killing germs even after washing.    Oral Hygiene is also important to reduce your risk of infection.  Remember - BRUSH YOUR TEETH THE MORNING OF SURGERY WITH YOUR REGULAR TOOTHPASTE  Please do not use if you have an allergy to CHG or antibacterial soaps. If your skin becomes reddened/irritated stop using the CHG.  Do not shave (including legs and underarms) for at least 48 hours prior to first CHG shower. It is OK to shave your face.  Please follow these instructions carefully.   1. Shower the NIGHT BEFORE SURGERY and the MORNING OF SURGERY with CHG Soap.   2. If you chose to wash your hair, wash your hair first as usual with your normal shampoo.  3. After you shampoo, rinse your hair and body thoroughly to remove the shampoo.  4. Use CHG as you would any other  liquid soap. You can apply CHG directly to the skin and wash gently with a scrungie or a clean washcloth.   5. Apply the CHG Soap to your body ONLY FROM THE NECK DOWN.  Do not use on open wounds or open sores. Avoid contact with your eyes, ears, mouth and genitals (private parts). Wash Face and genitals (private parts)  with your normal soap.   6. Wash thoroughly, paying special attention to the area where your surgery will be performed.  7. Thoroughly rinse your body with warm water from the neck down.  8. DO NOT shower/wash with your normal soap after using and rinsing off the CHG Soap.  9. Pat yourself dry with a CLEAN TOWEL.  10. Wear CLEAN PAJAMAS to bed the night before surgery, wear comfortable clothes the morning of surgery  11. Place CLEAN SHEETS on your bed the night of  your first shower and DO NOT SLEEP WITH PETS.   Day of Surgery: Shower with CHG Soap.  Do not apply any deodorants/lotions.  Please wear clean clothes to the hospital/surgery center.   Remember to brush your teeth WITH YOUR REGULAR TOOTHPASTE.   Please read over the following fact sheets that you were given.

## 2019-09-04 ENCOUNTER — Encounter (HOSPITAL_COMMUNITY)
Admission: RE | Admit: 2019-09-04 | Discharge: 2019-09-04 | Disposition: A | Discharge status: Home / Self Care | Payer: Medicare HMO | Source: Ambulatory Visit | Attending: Thoracic Surgery (Cardiothoracic Vascular Surgery) | Admitting: Thoracic Surgery (Cardiothoracic Vascular Surgery)

## 2019-09-04 ENCOUNTER — Ambulatory Visit (HOSPITAL_BASED_OUTPATIENT_CLINIC_OR_DEPARTMENT_OTHER)
Admission: RE | Admit: 2019-09-04 | Discharge: 2019-09-04 | Disposition: A | Discharge status: Home / Self Care | Payer: Medicare HMO | Source: Ambulatory Visit | Attending: Thoracic Surgery (Cardiothoracic Vascular Surgery) | Admitting: Thoracic Surgery (Cardiothoracic Vascular Surgery)

## 2019-09-04 ENCOUNTER — Other Ambulatory Visit (HOSPITAL_COMMUNITY): Payer: Medicare HMO

## 2019-09-04 ENCOUNTER — Other Ambulatory Visit: Payer: Self-pay

## 2019-09-04 ENCOUNTER — Other Ambulatory Visit (HOSPITAL_COMMUNITY)
Admission: RE | Admit: 2019-09-04 | Discharge: 2019-09-04 | Disposition: A | Discharge status: Home / Self Care | Payer: Medicare HMO | Source: Ambulatory Visit | Attending: Thoracic Surgery (Cardiothoracic Vascular Surgery) | Admitting: Thoracic Surgery (Cardiothoracic Vascular Surgery)

## 2019-09-04 ENCOUNTER — Ambulatory Visit: Payer: Medicare HMO | Admitting: Thoracic Surgery (Cardiothoracic Vascular Surgery)

## 2019-09-04 ENCOUNTER — Encounter: Payer: Self-pay | Admitting: Thoracic Surgery (Cardiothoracic Vascular Surgery)

## 2019-09-04 ENCOUNTER — Encounter (HOSPITAL_COMMUNITY): Payer: Self-pay

## 2019-09-04 VITALS — BP 146/84 | HR 62 | Temp 97.3°F | Resp 16 | Ht 63.0 in | Wt 185.0 lb

## 2019-09-04 DIAGNOSIS — I2510 Atherosclerotic heart disease of native coronary artery without angina pectoris: Secondary | ICD-10-CM | POA: Diagnosis not present

## 2019-09-04 DIAGNOSIS — I350 Nonrheumatic aortic (valve) stenosis: Secondary | ICD-10-CM

## 2019-09-04 DIAGNOSIS — I5032 Chronic diastolic (congestive) heart failure: Secondary | ICD-10-CM | POA: Insufficient documentation

## 2019-09-04 DIAGNOSIS — Z79899 Other long term (current) drug therapy: Secondary | ICD-10-CM | POA: Insufficient documentation

## 2019-09-04 DIAGNOSIS — I110 Hypertensive heart disease with heart failure: Secondary | ICD-10-CM | POA: Insufficient documentation

## 2019-09-04 DIAGNOSIS — K219 Gastro-esophageal reflux disease without esophagitis: Secondary | ICD-10-CM | POA: Insufficient documentation

## 2019-09-04 DIAGNOSIS — G5622 Lesion of ulnar nerve, left upper limb: Secondary | ICD-10-CM | POA: Diagnosis not present

## 2019-09-04 DIAGNOSIS — Z7982 Long term (current) use of aspirin: Secondary | ICD-10-CM | POA: Insufficient documentation

## 2019-09-04 DIAGNOSIS — I7300 Raynaud's syndrome without gangrene: Secondary | ICD-10-CM | POA: Insufficient documentation

## 2019-09-04 DIAGNOSIS — J449 Chronic obstructive pulmonary disease, unspecified: Secondary | ICD-10-CM | POA: Insufficient documentation

## 2019-09-04 DIAGNOSIS — Z20822 Contact with and (suspected) exposure to covid-19: Secondary | ICD-10-CM | POA: Insufficient documentation

## 2019-09-04 DIAGNOSIS — M069 Rheumatoid arthritis, unspecified: Secondary | ICD-10-CM | POA: Insufficient documentation

## 2019-09-04 DIAGNOSIS — Z01818 Encounter for other preprocedural examination: Secondary | ICD-10-CM | POA: Insufficient documentation

## 2019-09-04 DIAGNOSIS — E039 Hypothyroidism, unspecified: Secondary | ICD-10-CM | POA: Insufficient documentation

## 2019-09-04 DIAGNOSIS — G4733 Obstructive sleep apnea (adult) (pediatric): Secondary | ICD-10-CM | POA: Insufficient documentation

## 2019-09-04 DIAGNOSIS — Z7901 Long term (current) use of anticoagulants: Secondary | ICD-10-CM | POA: Insufficient documentation

## 2019-09-04 DIAGNOSIS — E785 Hyperlipidemia, unspecified: Secondary | ICD-10-CM | POA: Insufficient documentation

## 2019-09-04 DIAGNOSIS — Q231 Congenital insufficiency of aortic valve: Secondary | ICD-10-CM | POA: Insufficient documentation

## 2019-09-04 HISTORY — DX: Anxiety disorder, unspecified: F41.9

## 2019-09-04 HISTORY — DX: Chronic obstructive pulmonary disease, unspecified: J44.9

## 2019-09-04 HISTORY — DX: Depression, unspecified: F32.A

## 2019-09-04 HISTORY — DX: Dyspnea, unspecified: R06.00

## 2019-09-04 HISTORY — DX: Heart failure, unspecified: I50.9

## 2019-09-04 LAB — COMPREHENSIVE METABOLIC PANEL
ALT: 19 U/L (ref 0–44)
AST: 25 U/L (ref 15–41)
Albumin: 3.8 g/dL (ref 3.5–5.0)
Alkaline Phosphatase: 66 U/L (ref 38–126)
Anion gap: 10 (ref 5–15)
BUN: 9 mg/dL (ref 6–20)
CO2: 20 mmol/L — ABNORMAL LOW (ref 22–32)
Calcium: 10.2 mg/dL (ref 8.9–10.3)
Chloride: 110 mmol/L (ref 98–111)
Creatinine, Ser: 0.79 mg/dL (ref 0.44–1.00)
GFR calc Af Amer: >60 mL/min (ref >60)
GFR calc non Af Amer: >60 mL/min (ref >60)
Glucose, Bld: 102 mg/dL — ABNORMAL HIGH (ref 70–99)
Potassium: 4.1 mmol/L (ref 3.5–5.1)
Sodium: 140 mmol/L (ref 135–145)
Total Bilirubin: 0.5 mg/dL (ref 0.3–1.2)
Total Protein: 6.8 g/dL (ref 6.5–8.1)

## 2019-09-04 LAB — CBC
HCT: 43.1 % (ref 36.0–46.0)
Hemoglobin: 14 g/dL (ref 12.0–15.0)
MCH: 30.4 pg (ref 26.0–34.0)
MCHC: 32.5 g/dL (ref 30.0–36.0)
MCV: 93.7 fL (ref 80.0–100.0)
Platelets: 249 10*3/uL (ref 150–400)
RBC: 4.6 MIL/uL (ref 3.87–5.11)
RDW: 12.4 % (ref 11.5–15.5)
WBC: 5.1 10*3/uL (ref 4.0–10.5)
nRBC: 0 % (ref 0.0–0.2)

## 2019-09-04 LAB — URINALYSIS, ROUTINE W REFLEX MICROSCOPIC
Bilirubin Urine: NEGATIVE
Glucose, UA: NEGATIVE mg/dL
Hgb urine dipstick: NEGATIVE
Ketones, ur: NEGATIVE mg/dL
Nitrite: NEGATIVE
Protein, ur: NEGATIVE mg/dL
Specific Gravity, Urine: 1.015 (ref 1.005–1.030)
pH: 6 (ref 5.0–8.0)

## 2019-09-04 LAB — URINALYSIS, MICROSCOPIC (REFLEX): RBC / HPF: NONE SEEN RBC/hpf (ref 0–5)

## 2019-09-04 LAB — HEMOGLOBIN A1C
Hgb A1c MFr Bld: 5.3 % (ref 4.8–5.6)
Mean Plasma Glucose: 105.41 mg/dL

## 2019-09-04 LAB — BLOOD GAS, ARTERIAL
Acid-base deficit: 0.1 mmol/L (ref 0.0–2.0)
Bicarbonate: 24.1 mmol/L (ref 20.0–28.0)
Drawn by: 58793
FIO2: 21
O2 Saturation: 98.8 %
Patient temperature: 37
pCO2 arterial: 39.3 mmHg (ref 32.0–48.0)
pH, Arterial: 7.404 (ref 7.350–7.450)
pO2, Arterial: 126 mmHg — ABNORMAL HIGH (ref 83.0–108.0)

## 2019-09-04 LAB — SARS CORONAVIRUS 2 (TAT 6-24 HRS): SARS Coronavirus 2: NEGATIVE

## 2019-09-04 LAB — SURGICAL PCR SCREEN
MRSA, PCR: NEGATIVE
Staphylococcus aureus: NEGATIVE

## 2019-09-04 LAB — ABO/RH: ABO/RH(D): O POS

## 2019-09-04 NOTE — Progress Notes (Signed)
VASCULAR LAB PRELIMINARY  PRELIMINARY  PRELIMINARY  PRELIMINARY  Pre CABG Dopplers completed.    Preliminary report:  See CV proc for preliminary results.  Alysia Scism, RVT 09/04/2019, 9:42 AM

## 2019-09-04 NOTE — Patient Instructions (Signed)
   Continue taking all current medications without change through the day before surgery.  Make sure to bring all of your medications with you when you come for your Pre-Admission Testing appointment at [facility] Short-Stay Department.  Have nothing to eat or drink after midnight the night before surgery.  On the morning of surgery take only Synthroid with a sip of water.

## 2019-09-04 NOTE — Progress Notes (Signed)
[address]       [name] [address]             [phone]     CARDIOTHORACIC SURGERY OFFICE NOTE  Referring Provider is Lewayne Bunting, MD PCP is Drosinis, Leonia Reader, PA-C   HPI:  Patient is a 59 year old obese female with bicuspid aortic valve, hypertension, hyperlipidemia, GE reflux disease, scleroderma, rheumatoid arthritis, and obstructive sleep apnea on CPAP who returns to the office today for management of severe symptomatic aortic stenosis.  She was originally seen in consultation on Aug 07, 2019 at which time we made tentative plans for elective aortic valve replacement later this week.  She returns to the office today and reports no new problems or complaints.  She describes stable symptoms of exertional shortness of breath and fatigue consistent with chronic diastolic congestive heart failure, New York Heart Association functional class III.   Current Outpatient Medications  Medication Sig Dispense Refill  . amLODipine (NORVASC) 10 MG tablet TAKE 1 TABLET(10 MG) BY MOUTH DAILY (Patient taking differently: Take 10 mg by mouth daily. ) 90 tablet 0  . aspirin EC 81 MG tablet Take 81 mg by mouth daily.    Marland Kitchen atorvastatin (LIPITOR) 40 MG tablet Take 40 mg by mouth daily.     . carboxymethylcellulose (REFRESH PLUS) 0.5 % SOLN Place 1 drop into both eyes daily.     . cholecalciferol (VITAMIN D) 25 MCG (1000 UNIT) tablet Take 1,000 Units by mouth 2 (two) times daily.     . diclofenac (CATAFLAM) 50 MG tablet Take 50 mg by mouth 2 (two) times daily as needed (migraine).     . divalproex (DEPAKOTE ER) 500 MG 24 hr tablet Take 500 mg by mouth daily.    Marland Kitchen doxycycline (VIBRAMYCIN) 100 MG capsule Take 100 mg by mouth as needed (acne break outs).     . lansoprazole (PREVACID) 30 MG capsule Take 30 mg by mouth 2 (two) times daily.     Marland Kitchen levothyroxine (SYNTHROID) 75 MCG tablet Take 75 mcg by mouth daily before breakfast.     . Melatonin 5 MG CAPS Take 5 mg by mouth  at bedtime as needed (for sleep).    . metoprolol succinate (TOPROL-XL) 50 MG 24 hr tablet Take 50 mg by mouth daily.     . Multiple Vitamin (MULTIVITAMIN WITH MINERALS) TABS tablet Take 1 tablet by mouth daily.    . NURTEC 75 MG TBDP Take 1 tablet by mouth daily as needed for headache.    . tretinoin (RETIN-A) 0.05 % cream Apply 1 application topically daily as needed (Outbreak).     . vitamin B-12 (CYANOCOBALAMIN) 1000 MCG tablet Take 1,000 mcg by mouth daily.     . Omega-3 Fatty Acids (EQL OMEGA 3 FISH OIL) 1000 MG CAPS Take 1,000 mg by mouth 2 (two) times daily.  (Patient not taking: Reported on 09/04/2019)     Current Facility-Administered Medications  Medication Dose Route Frequency Provider Last Rate Last Admin  . sodium chloride flush (NS) 0.9 % injection 3 mL  3 mL Intravenous Q12H Lewayne Bunting, MD          Physical Exam:   BP (!) 146/84 (BP Location: Left Arm, Patient Position: Sitting, Cuff Size: Normal)   Pulse 62   Temp (!) 97.3 F (36.3 C)   Resp 16   Ht 5\' 3"  (1.6 m)   Wt 185 lb (83.9 kg)   SpO2 (!) 60%  Comment: REYNAUD'S  BMI 32.77 kg/m   General:  Well-appearing  Chest:   Clear to auscultation  CV:   Regular rate and rhythm with prominent systolic murmur  Incisions:  n/a  Abdomen:  Soft nontender  Extremities:  Warm and well-perfused  Diagnostic Tests:  Cardiac TAVR CT  TECHNIQUE: The patient was scanned on a [employer]. A 120 kV retrospective scan was triggered in the descending thoracic aorta at 111 HU's. Gantry rotation speed was 250 msecs and collimation was .6 mm. No beta blockade or nitro were given. The 3D data set was reconstructed in 5% intervals of the R-R cycle. Systolic and diastolic phases were analyzed on a dedicated work station using MPR, MIP and VRT modes. The patient received 80 cc of contrast.  FINDINGS: Image quality: Poor timing of contrast bolus as it appears to be mainly in the SVC. HUs in the aorta are  <250.  Noise artifact is: Mild.  Valve Morphology: The aortic valve appears to be bicuspid with fusion of the RCC/LCC with raphe Ileene Rubens type 1). There is severe bulky calcification of the NCC. Leaflet motion is severely restricted in systole. There is no raphe calcification.  Aortic Valve Calcium score: 792  Aortic annular dimension:  Phase assessed: 25%  Annular area: 395 mm2  Annular perimeter: 71.3 mm  Max diameter: 24.3 mm  Min diameter: 21.2 mm  Annular and subannular calcification: No significant annular calcification. Minimal LVOT calcification under the NCC/LCC.  Optimal coplanar projection: LAO 27 CAU 9  Coronary Artery Height above Annulus:  Left Main: 15.8 mm  Right Coronary: 14.7 mm  Sinus of Valsalva Measurements (Bicuspid):  Commissure to Commissure: 24 mm  Maximum Diameter: 29 mm  Sinus of Valsalva Height:  Non-coronary: 21.3 mm  Right-coronary: 21.9 mm  Left-coronary: 23.3 mm  Sinotubular Junction: 29 mm  Ascending Thoracic Aorta: 32 mm  Coronary Arteries: Normal coronary origin. Right dominance. The study was performed without use of NTG and is insufficient for plaque evaluation. Please refer to recent cardiac cath report for coronary assessment.  Cardiac Morphology:  Right Atrium: Right atrial size is within normal limits.  Right Ventricle: The right ventricular cavity is within normal limits.  Left Atrium: Left atrial size is normal in size with no left atrial appendage filling defect.  Left Ventricle: The ventricular cavity size is within normal limits. There are no stigmata of prior infarction. There is no abnormal filling defect.  Pulmonary arteries: Normal in size without proximal filling defect.  Pulmonary veins: Normal pulmonary venous drainage.  Pericardium: Normal thickness with no significant effusion or calcium present.  Mitral Valve: The mitral valve is normal structure  without significant calcification.  Extra-cardiac findings: See attached radiology report for non-cardiac structures.  IMPRESSION: 1. Severely limited study due to poor timing of contrast bolus (bolus noted to be in SVC).  2. 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  3. Annular measurements (395 mm2) appropriate for 23 mm Edwards Sapien 3 TAVR.  4.  Sufficient coronary to annulus distance.  5.  Optimal Fluoroscopic Angle for Delivery: LAO 27 CAU 9   T. Flora Lipps, MD   Electronically Signed   By: Lennie Odor   On: 08/16/2019 07:18    CT ANGIOGRAPHY CHEST, ABDOMEN AND PELVIS  TECHNIQUE: Non-contrast CT of the chest was initially obtained.  Multidetector CT imaging through the chest, abdomen and pelvis was performed using the standard protocol during bolus administration of intravenous contrast. Multiplanar reconstructed  images and MIPs were obtained and reviewed to evaluate the vascular anatomy.  CONTRAST:  OMNIPAQUE IOHEXOL 350 MG/ML SOLN  COMPARISON:  Chest CTA 02/05/2017.  FINDINGS: CTA CHEST FINDINGS  Cardiovascular: Heart size is mildly enlarged with concentric left ventricular hypertrophy. There is no significant pericardial fluid, thickening or pericardial calcification. There is aortic atherosclerosis, as well as atherosclerosis of the great vessels of the mediastinum and the coronary arteries, including calcified atherosclerotic plaque in the left anterior descending coronary artery. Severe thickening calcification of the aortic valve.  Mediastinum/Lymph Nodes: No pathologically enlarged mediastinal or hilar lymph nodes. Esophagus is unremarkable in appearance. No axillary lymphadenopathy.  Lungs/Pleura: No suspicious appearing pulmonary nodules or masses are noted. No acute consolidative airspace disease. No pleural effusions.  Musculoskeletal/Soft Tissues: There are no aggressive  appearing lytic or blastic lesions noted in the visualized portions of the skeleton.  CTA ABDOMEN AND PELVIS FINDINGS  Hepatobiliary: No suspicious cystic or solid hepatic lesions. No intra or extrahepatic biliary ductal dilatation. Status post cholecystectomy.  Pancreas: No pancreatic mass. No pancreatic ductal dilatation. No pancreatic or peripancreatic fluid collections or inflammatory changes.  Spleen: 2.4 cm low-attenuation lesion in the superior aspect of the spleen, incompletely characterize, but likely a benign cyst.  Adrenals/Urinary Tract: Subcentimeter low-attenuation lesions in both kidneys, too small to characterize, but statistically likely to represent tiny cysts. Bilateral adrenal glands are normal in appearance. No hydroureteronephrosis. Urinary bladder is normal in appearance.  Stomach/Bowel: Normal appearance of the stomach. No pathologic dilatation of small bowel or colon. Normal appendix.  Vascular/Lymphatic: Aortic atherosclerosis, with vascular findings and measurements pertinent to potential TAVR procedure, as detailed below. No aneurysm or dissection noted in the abdominal or pelvic vasculature. No lymphadenopathy noted in the abdomen or pelvis.  Reproductive: Status post hysterectomy. Ovaries are not confidently identified may be surgically absent or atrophic.  Other: No significant volume of ascites.  No pneumoperitoneum.  Musculoskeletal: There are no aggressive appearing lytic or blastic lesions noted in the visualized portions of the skeleton.  VASCULAR MEASUREMENTS PERTINENT TO TAVR:  AORTA:  Minimal Aortic Diameter-14 x 14 mm  Severity of Aortic Calcification-mild  RIGHT PELVIS:  Right Common Iliac Artery -  Minimal Diameter-9.5 x 9.1 mm  Tortuosity-mild  Calcification-none  Right External Iliac Artery -  Minimal Diameter-7.6 x 7.6 mm  Tortuosity-moderate  Calcification-none  Right Common Femoral  Artery -  Minimal Diameter-7.9 x 8.5 mm  Tortuosity-mild  Calcification-none  LEFT PELVIS:  Left Common Iliac Artery -  Minimal Diameter-9.7 x 9.7 mm  Tortuosity-mild  Calcification-none  Left External Iliac Artery -  Minimal Diameter-6.6 x 6.9 mm  Tortuosity-moderate  Calcification-none  Left Common Femoral Artery -  Minimal Diameter-8.1 x 7.9 mm  Tortuosity-mild  Calcification-none  Review of the MIP images confirms the above findings.  IMPRESSION: 1. Vascular findings and measurements pertinent to potential TAVR procedure, as detailed above. 2. Severe thickening calcification of the aortic valve, compatible with reported clinical history of severe aortic stenosis. 3. Cardiomegaly with concentric left ventricular hypertrophy. 4. Additional incidental findings, as above.   Electronically Signed   By: Trudie Reed M.D.   On: 08/15/2019 15:38    Impression:  Patient has stage D severe symptomatic aortic stenosis.  She describes a long history of progressive symptoms of exertional shortness of breath and fatigue that are likely multifactorial but consistent with worsening symptoms of chronic diastolic congestive heart failure, New York Heart Association functional class III.  She has also been having increasing dizzy spells  and atypical chest pain.  I have personally reviewed the patient's most recent transthoracic echocardiogram, diagnostic cardiac catheterization, and CT angiograms.  Echocardiogram performed October 2020 revealed severe aortic stenosis with normal left ventricular systolic function.  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  Peak velocity across aortic valve measured greater than 4.1 m/s corresponding to mean transvalvular gradient estimated 40 mmHg.  DVI was reported 0.22 and aortic valve area calculated only 0.70 cm.  Diagnostic cardiac catheterization revealed stable nonobstructive coronary  artery disease and confirmed the presence of aortic stenosis with mean transvalvular gradient measured 32 mmHg corresponding to aortic valve area calculated 1.1 cm.  There was mild pulmonary hypertension.  Baseline EKG reveals sinus rhythm without significant AV conduction delay.  CT angiography reveals no significant complicating features and no contraindications to peripheral cannulation for surgery.  I agree the patient would benefit from aortic valve replacement.  Risks associated with conventional surgery would be relatively low although slightly elevated by the patient's comorbid medical problems and physical deconditioning.  She appears to be an acceptable candidate for minimally invasive approach for surgery.     Plan:  The patient was again counseled at length regarding treatment alternatives for management of severe symptomatic aortic stenosis. Alternative approaches such as conventional aortic valve replacement, transcatheter aortic valve replacement, and continued medical therapy without intervention were compared and contrasted at length.  The risks associated with conventional surgical aortic valve replacement were discussed in detail, as were expectations for post-operative convalescence, alternative surgical approaches, prosthetic valve choices, and the presence or absence of other concomitant conditions which might require intervention.  Discussion was held comparing the relative risks of mechanical valve replacement with need for lifelong anticoagulation versus use of a bioprosthetic tissue valve and the associated potential for late structural valve deterioration and failure.  This discussion was placed in the context of the patient's particular circumstances, and as a result the patient specifically requests that their valve be replaced using a bioprosthetic tissue valve .  Alternative surgical approaches have been discussed including a comparison between conventional sternotomy  and minimally-invasive techniques.  The relative risks and benefits of each have been reviewed as they pertain to the patient's specific circumstances, and all of their questions have been addressed.   The patient understands and accepts all potential associated risks of surgery including but not limited to risk of death, stroke, myocardial infarction, congestive heart failure, respiratory failure, renal failure, pneumonia, bleeding requiring blood transfusion and or reexploration, arrhythmia, heart block or bradycardia requiring permanent pacemaker, aortic dissection or other major vascular complication, pleural effusions or other delayed complications related to continued congestive heart failure, and other late complications related to valve replacement including structural valve deterioration and failure, thrombosis, endocarditis, or paravalvular leak. Specific risks potentially related to the minimally-invasive approach were discussed at length, including but not limited to risk of conversion to full or partial sternotomy, aortic dissection or other major vascular complication, unilateral acute lung injury or pulmonary edema, phrenic nerve dysfunction or paralysis, rib fracture, chronic pain, lung hernia, or lymphocele.     I spent in excess of 15 minutes during the conduct of this office consultation and >50% of this time involved direct face-to-face encounter with the patient for counseling and/or coordination of their care.   Valentina Gu. Roxy Manns, MD 09/04/2019 1:41 PM

## 2019-09-04 NOTE — Progress Notes (Signed)
PCP - Lupita Leash Drosinis PA @ [facility] Cardiologist - Olga Millers  PPM/ICD - na   Chest x-ray - 09/04/19 EKG - 09/04/19 Stress Test -  ECHO - 10/20 Cardiac Cath - 5/21  Sleep Study - 2018 -will request from Dr. Camelia Phenes @[facility]   CPAP - yes  Fasting Blood Sugar - na Checks Blood Sugar _____ times a day  Blood Thinner Instructions:na Aspirin Instructions: not to take DOS  ERAS Protcol -na   COVID TEST- 09/04/19   Anesthesia review: sleep study/cardiac hx.  Patient denies shortness of breath, fever, cough and chest pain at PAT appointment   All instructions explained to the patient, with a verbal understanding of the material. Patient agrees to go over the instructions while at home for a better understanding. Patient also instructed to self quarantine after being tested for COVID-19. The opportunity to ask questions was provided.

## 2019-09-05 NOTE — Progress Notes (Signed)
Anesthesia Chart Review:  Case: [id number] Date/Time: 09/07/19 0715   Procedures:      MINIMALLY INVASIVE AORTIC VALVE REPLACEMENT (AVR) (N/A Chest)     TRANSESOPHAGEAL ECHOCARDIOGRAM (TEE) (N/A )   Anesthesia type: General   Pre-op diagnosis: AS   Location: [facility] OR ROOM 15 / [place] [facility]   Surgeons: Rexene Alberts, MD      DISCUSSION: Patient is a 59 year old female scheduled for the above procedure.  History includes never smoker, scleroderma, Raynaud's disease (diagnosed 1994), RA, murmur/bicuspid aortic valve with severe AS, chronic diastolic CHF, HTN, HLD, COPD, OSA (CPAP), dyspnea, migraines, GERD, hiatal hernia, blood clots (by notes [facility] Rheumatology note 12/17/04, history of radial artery occlusions with "bilateral digital sympathectomy with radial artery bypasses" ~ 2000).  BMI is consistent with obesity  According to 05/11/17 neurology not by Thomos Lemons, PA-C with [facility], "Sleep study results: Her polysomnogram on 02/18/2017 showed obstructive sleep apnea with an AHI of 5.6 and desaturations to 96%.  09/04/2019 presurgical COVID-19 test negative.  Anesthesia team to evaluate on the day of surgery. Her PT/PTT clotted, so needs to be redrawn on the day of surgery.    VS: BP (!) 147/74   Pulse 62   Temp 37 C (Oral)   Resp 20   Ht 5\' 3"  (1.6 m)   Wt 84.4 kg   SpO2 100%   BMI 32.96 kg/m    PROVIDERS: Drosinis, Pamalee Leyden, PA-C his PCP [facility], see [place]) Kirk Ruths, MD is primary cardiologist   LABS: Preoperative labs noted. UA showed large leukocytes, negative nitrites, 6-10 WBC, rare bacteria--message sent to [facility] regarding results, defer additional recommendations, if any, to surgeon.  (all labs ordered are listed, but only abnormal results are displayed)  Labs Reviewed  BLOOD GAS, ARTERIAL - Abnormal; Notable for the following components:      Result Value   pO2, Arterial 126 (*)    Allens test (pass/fail) BRACHIAL ARTERY (*)     All other components within normal limits  COMPREHENSIVE METABOLIC PANEL - Abnormal; Notable for the following components:   CO2 20 (*)    Glucose, Bld 102 (*)    All other components within normal limits  URINALYSIS, ROUTINE W REFLEX MICROSCOPIC - Abnormal; Notable for the following components:   APPearance HAZY (*)    Leukocytes,Ua LARGE (*)    All other components within normal limits  URINALYSIS, MICROSCOPIC (REFLEX) - Abnormal; Notable for the following components:   Bacteria, UA RARE (*)    All other components within normal limits  SURGICAL PCR SCREEN  CBC  HEMOGLOBIN A1C  TYPE AND SCREEN  ABO/RH     IMAGES: CXR 09/04/19: FINDINGS: Lungs are clear. Heart size is upper normal. Pulmonary vascularity is normal. No adenopathy. No bone lesions. IMPRESSION: Lungs clear.  Heart upper normal in size.   EKG: 09/04/2019: Normal sinus rhythm   CV: Carotid US 09/04/19: Summary:  Right Carotid: The extracranial vessels were near-normal with only minimal  wall thickening or plaque.  Left Carotid: The extracranial vessels were near-normal with only minimal  wall thickening or plaque.  Vertebrals: Bilateral vertebral arteries demonstrate antegrade flow.  Subclavians: Normal flow hemodynamics were seen in bilateral subclavian  arteries   CT Coronary 08/15/19: IMPRESSION: 1. Severely limited study due to poor timing of contrast bolus (bolus noted to be in SVC). 2. 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 3. Annular measurements (395 mm2) appropriate for  23 mm Edwards Sapien 3 TAVR. 4.  Sufficient coronary to annulus distance. 5.  Optimal Fluoroscopic Angle for Delivery: LAO 27 CAU 9   Cardiac cath 07/26/19: 1.  Mild (25%) nonobstructive coronary artery disease primarily affecting the proximal LAD, unchanged from the previous cardiac catheterization study in 2017 2.  Severe aortic stenosis with a mean gradient of 32 mmHg 3.  Mildly  elevated pulmonary artery pressure, likely secondary to left heart disease Recommendation: Cardiac surgical evaluation for consideration of aortic valve replacement.   Echo 01/05/19: IMPRESSIONS  1. Left ventricular ejection fraction, by visual estimation, is 55 to  60%. The left ventricle has normal function. Left ventricular septal wall  thickness was moderately increased. There is moderately increased left  ventricular hypertrophy.  2. Global right ventricle has normal systolic function.The right  ventricular size is normal. No increase in right ventricular wall  thickness.  3. Left atrial size was mildly dilated.  4. Right atrial size was normal.  5. The mitral valve is normal in structure. Trace mitral valve  regurgitation.  6. The tricuspid valve is normal in structure. Tricuspid valve  regurgitation is mild.  7. The aortic valve has an indeterminant number of cusps Aortic valve  regurgitation is mild by color flow Doppler. Severe aortic valve stenosis.  8. There is Moderate thickening of the aortic valve.  9. There is Severe calcifcation of the aortic valve.  10. AV calcified with restricted motion Inderterminate number of leaflets  Previous echo done 01/03/18 suggested moderate AS with mean gradient 22  peak 44 mmHg Suprasternal CW signal on this study suggests severe AS with  mean gradient 40 mmHg and peak 70  mmhg.  11. The pulmonic valve was grossly normal. Pulmonic valve regurgitation is  mild by color flow Doppler.    Past Medical History:  Diagnosis Date  . Anxiety   . Aortic stenosis   . CHF (congestive heart failure) (HCC)   . COPD (chronic obstructive pulmonary disease) (HCC)   . Depression   . Dyspnea    on exertion and lying down  . GERD (gastroesophageal reflux disease)   . H/O blood clots   . Headache    migraines  . Heart murmur   . History of hiatal hernia   . Hyperlipidemia   . Hypertension   . Hypothyroidism   . Insomnia   . Painful  orthopaedic hardware (HCC)   . Raynaud disease   . Rheumatoid arthritis (HCC)   . Scleroderma (HCC)   . Sleep apnea    wears cpap  . Wears glasses   . Wears partial dentures     Past Surgical History:  Procedure Laterality Date  . ABDOMINAL HYSTERECTOMY     TAH  . BUNIONECTOMY    . CARDIAC CATHETERIZATION N/A 04/24/2015   Procedure: Right/Left Heart Cath and Coronary Angiography;  Surgeon: Tonny Bollman, MD;  Location: [facility] INVASIVE CV LAB;  Service: Cardiovascular;  Laterality: N/A;  . CHOLECYSTECTOMY    . COLONOSCOPY WITH ESOPHAGOGASTRODUODENOSCOPY (EGD)    . HARDWARE REMOVAL Right 01/20/2018   Procedure: Removal of deep implants right medial cuneiform, 1st metatarsal and 2nd metatarsal;  Surgeon: Toni Arthurs, MD;  Location: [facility] OR;  Service: Orthopedics;  Laterality: Right;   . MULTIPLE TOOTH EXTRACTIONS    . RIGHT/LEFT HEART CATH AND CORONARY ANGIOGRAPHY N/A 07/26/2019   Procedure: RIGHT/LEFT HEART CATH AND CORONARY ANGIOGRAPHY;  Surgeon: Tonny Bollman, MD;  Location: [facility] INVASIVE CV LAB;  Service: Cardiovascular;  Laterality: N/A;  .  sympathomyectomies  2000   Bilateral wrists  . THROMBECTOMY Left 1999   wrist    MEDICATIONS: . amLODipine (NORVASC) 10 MG tablet  . aspirin EC 81 MG tablet  . atorvastatin (LIPITOR) 40 MG tablet  . carboxymethylcellulose (REFRESH PLUS) 0.5 % SOLN  . cholecalciferol (VITAMIN D) 25 MCG (1000 UNIT) tablet  . diclofenac (CATAFLAM) 50 MG tablet  . divalproex (DEPAKOTE ER) 500 MG 24 hr tablet  . doxycycline (VIBRAMYCIN) 100 MG capsule  . lansoprazole (PREVACID) 30 MG capsule  . levothyroxine (SYNTHROID) 75 MCG tablet  . Melatonin 5 MG CAPS  . metoprolol succinate (TOPROL-XL) 50 MG 24 hr tablet  . Multiple Vitamin (MULTIVITAMIN WITH MINERALS) TABS tablet  . NURTEC 75 MG TBDP  . Omega-3 Fatty Acids (EQL OMEGA 3 FISH OIL) 1000 MG CAPS  . tretinoin (RETIN-A) 0.05 % cream  . vitamin B-12 (CYANOCOBALAMIN) 1000 MCG tablet   . sodium chloride  flush (NS) 0.9 % injection 3 mL   Doxycycline as prescribed as needed for acne breakouts.  Omega-3 fish oil on hold for surgery.   Shonna Chock, PA-C Surgical Short Stay/Anesthesiology [facility] Phone [phone] [facility] Phone [phone] 09/05/2019 2:19 PM

## 2019-09-05 NOTE — Anesthesia Preprocedure Evaluation (Addendum)
Anesthesia Evaluation  Patient identified by MRN, date of birth, ID band Patient awake    Reviewed: Allergy & Precautions, NPO status , Patient's Chart, lab work & pertinent test results  History of Anesthesia Complications Negative for: history of anesthetic complications  Airway Mallampati: II  TM Distance: >3 FB Neck ROM: Full    Dental  (+) Dental Advisory Given, Missing, Partial Upper   Pulmonary sleep apnea and Continuous Positive Airway Pressure Ventilation , COPD,    Pulmonary exam normal        Cardiovascular hypertension, + Valvular Problems/Murmurs AS  Rhythm:Regular Rate:Normal + Systolic murmurs  history of radial artery occlusions with "bilateral digital sympathectomy with radial artery bypasses" ~ 2000)  Cardiac cath 07/26/19: 1. Mild (25%) nonobstructive coronary artery disease primarily affecting the proximal LAD, unchanged from the previous cardiac catheterization study in 2017 2. Severe aortic stenosis with a mean gradient of 32 mmHg 3. Mildly elevated pulmonary artery pressure, likely secondary to left heart disease Recommendation: Cardiac surgical evaluation for consideration of aortic valve replacement.   Echo 01/05/19: IMPRESSIONS  1. Left ventricular ejection fraction, by visual estimation, is 55 to  60%. The left ventricle has normal function. Left ventricular septal wall  thickness was moderately increased. There is moderately increased left  ventricular hypertrophy.  2. Global right ventricle has normal systolic function.The right  ventricular size is normal. No increase in right ventricular wall  thickness.  3. Left atrial size was mildly dilated.  4. Right atrial size was normal.  5. The mitral valve is normal in structure. Trace mitral valve  regurgitation.  6. The tricuspid valve is normal in structure. Tricuspid valve  regurgitation is mild.  7. The aortic valve has an  indeterminant number of cusps Aortic valve  regurgitation is mild by color flow Doppler. Severe aortic valve stenosis.  8. There is Moderate thickening of the aortic valve.  9. There is Severe calcifcation of the aortic valve.  10. AV calcified with restricted motion Inderterminate number of leaflets  Previous echo done 01/03/18 suggested moderate AS with mean gradient 22  peak 44 mmHg Suprasternal CW signal on this study suggests severe AS with  mean gradient 40 mmHg and peak 70  mmhg.  11. The pulmonic valve was grossly normal. Pulmonic valve regurgitation is  mild by color flow Doppler.     Neuro/Psych PSYCHIATRIC DISORDERS Anxiety Depression negative neurological ROS     GI/Hepatic Neg liver ROS, hiatal hernia, GERD  ,  Endo/Other  Hypothyroidism   Renal/GU negative Renal ROS     Musculoskeletal  (+) Arthritis ,   Abdominal   Peds  Hematology negative hematology ROS (+)   Anesthesia Other Findings Raynaud disease   . Rheumatoid arthritis ([place])  . Scleroderma ([place])     Reproductive/Obstetrics                         Anesthesia Physical Anesthesia Plan  ASA: IV  Anesthesia Plan: General   Post-op Pain Management:    Induction: Intravenous  PONV Risk Score and Plan: 4 or greater and Ondansetron, Dexamethasone, Midazolam and Diphenhydramine  Airway Management Planned: Oral ETT  Additional Equipment: Arterial line, PA Cath, TEE, 3D TEE and Ultrasound Guidance Line Placement  Intra-op Plan:   Post-operative Plan: Possible Post-op intubation/ventilation  Informed Consent: I have reviewed the patients History and Physical, chart, labs and discussed the procedure including the risks, benefits and alternatives for the proposed anesthesia with the patient or authorized  representative who has indicated his/her understanding and acceptance.     Dental advisory given  Plan Discussed with: CRNA and Anesthesiologist  Anesthesia Plan  Comments: (PAT note written 09/05/2019 by Shonna Chock, PA-C. History includes never smoker, scleroderma, Raynaud's disease, RA, bicuspid aortic valve/severe AS, chronic diastolic CHF, OSA (CPAP), radial artery bypasses.   Brachial a-line planned due to bilateral radial artery bypasses.)     Anesthesia Quick Evaluation

## 2019-09-06 ENCOUNTER — Ambulatory Visit: Payer: Medicare HMO | Admitting: Family Medicine

## 2019-09-06 MED ORDER — TRANEXAMIC ACID (OHS) PUMP PRIME SOLUTION
2.0000 mg/kg | INTRAVENOUS | Status: DC
  Filled 2019-09-06: qty 1.68

## 2019-09-06 MED ORDER — PLASMA-LYTE 148 IV SOLN
INTRAVENOUS | Status: DC
  Filled 2019-09-06: qty 2.5

## 2019-09-06 MED ORDER — TRANEXAMIC ACID 1000 MG/10ML IV SOLN
1.5000 mg/kg/h | INTRAVENOUS | Status: AC
  Administered 2019-09-07: 1.5 mg/kg/h via INTRAVENOUS
  Filled 2019-09-06: qty 25

## 2019-09-06 MED ORDER — INSULIN REGULAR(HUMAN) IN NACL 100-0.9 UT/100ML-% IV SOLN
INTRAVENOUS | Status: AC
  Administered 2019-09-07: 2.4 [IU]/h via INTRAVENOUS
  Filled 2019-09-06: qty 100

## 2019-09-06 MED ORDER — NOREPINEPHRINE 4 MG/250ML-% IV SOLN
0.0000 ug/min | INTRAVENOUS | Status: DC
  Filled 2019-09-06: qty 250

## 2019-09-06 MED ORDER — DEXMEDETOMIDINE HCL IN NACL 400 MCG/100ML IV SOLN
0.1000 ug/kg/h | INTRAVENOUS | Status: AC
  Administered 2019-09-07: .4 ug/kg/h via INTRAVENOUS
  Filled 2019-09-06: qty 100

## 2019-09-06 MED ORDER — POTASSIUM CHLORIDE 2 MEQ/ML IV SOLN
80.0000 meq | INTRAVENOUS | Status: DC
  Filled 2019-09-06: qty 40

## 2019-09-06 MED ORDER — TRANEXAMIC ACID (OHS) BOLUS VIA INFUSION
15.0000 mg/kg | INTRAVENOUS | Status: AC
  Administered 2019-09-07: 1258.5 mg via INTRAVENOUS
  Filled 2019-09-06: qty 1259

## 2019-09-06 MED ORDER — SODIUM CHLORIDE 0.9 % IV SOLN
750.0000 mg | INTRAVENOUS | Status: DC
  Filled 2019-09-06: qty 750

## 2019-09-06 MED ORDER — VANCOMYCIN HCL 1000 MG IV SOLR
INTRAVENOUS | Status: DC
  Filled 2019-09-06: qty 1000

## 2019-09-06 MED ORDER — NITROGLYCERIN IN D5W 200-5 MCG/ML-% IV SOLN
2.0000 ug/min | INTRAVENOUS | Status: AC
  Administered 2019-09-07: 10 ug/min via INTRAVENOUS
  Filled 2019-09-06: qty 250

## 2019-09-06 MED ORDER — MANNITOL 20 % IV SOLN
INTRAVENOUS | Status: DC
  Filled 2019-09-06: qty 13

## 2019-09-06 MED ORDER — SODIUM CHLORIDE 0.9 % IV SOLN
INTRAVENOUS | Status: DC
  Filled 2019-09-06: qty 30

## 2019-09-06 MED ORDER — SODIUM CHLORIDE 0.9 % IV SOLN
1.5000 g | INTRAVENOUS | Status: AC
  Administered 2019-09-07: 1.5 g via INTRAVENOUS
  Administered 2019-09-07: .75 g via INTRAVENOUS
  Filled 2019-09-06 (×2): qty 1.5

## 2019-09-06 MED ORDER — VANCOMYCIN HCL 1500 MG/300ML IV SOLN
1500.0000 mg | INTRAVENOUS | Status: AC
  Administered 2019-09-07: 1500 mg via INTRAVENOUS
  Filled 2019-09-06: qty 300

## 2019-09-06 MED ORDER — MILRINONE LACTATE IN DEXTROSE 20-5 MG/100ML-% IV SOLN
0.3000 ug/kg/min | INTRAVENOUS | Status: DC
  Filled 2019-09-06: qty 100

## 2019-09-06 MED ORDER — PHENYLEPHRINE HCL-NACL 20-0.9 MG/250ML-% IV SOLN
30.0000 ug/min | INTRAVENOUS | Status: AC
  Administered 2019-09-07: 40 ug/min via INTRAVENOUS
  Filled 2019-09-06: qty 250

## 2019-09-06 MED ORDER — EPINEPHRINE HCL 5 MG/250ML IV SOLN IN NS
0.0000 ug/min | INTRAVENOUS | Status: DC
  Filled 2019-09-06: qty 250

## 2019-09-07 ENCOUNTER — Other Ambulatory Visit: Payer: Self-pay

## 2019-09-07 ENCOUNTER — Encounter (HOSPITAL_COMMUNITY)
Admission: RE | Disposition: A | Discharge status: Home / Self Care | Payer: Self-pay | Source: Home / Self Care | Attending: Thoracic Surgery (Cardiothoracic Vascular Surgery)

## 2019-09-07 ENCOUNTER — Inpatient Hospital Stay (HOSPITAL_COMMUNITY)
Admission: RE | Admit: 2019-09-07 | Discharge: 2019-09-12 | DRG: 220 | Disposition: A | Discharge status: Home / Self Care | Payer: Medicare HMO | Attending: Thoracic Surgery (Cardiothoracic Vascular Surgery) | Admitting: Thoracic Surgery (Cardiothoracic Vascular Surgery)

## 2019-09-07 ENCOUNTER — Inpatient Hospital Stay (HOSPITAL_COMMUNITY): Payer: Medicare HMO | Admitting: Certified Registered"

## 2019-09-07 ENCOUNTER — Encounter (HOSPITAL_COMMUNITY): Payer: Self-pay | Admitting: Thoracic Surgery (Cardiothoracic Vascular Surgery)

## 2019-09-07 ENCOUNTER — Inpatient Hospital Stay (HOSPITAL_COMMUNITY): Payer: Medicare HMO

## 2019-09-07 ENCOUNTER — Inpatient Hospital Stay (HOSPITAL_COMMUNITY): Payer: Medicare HMO | Admitting: Physician Assistant

## 2019-09-07 DIAGNOSIS — I352 Nonrheumatic aortic (valve) stenosis with insufficiency: Secondary | ICD-10-CM | POA: Diagnosis present

## 2019-09-07 DIAGNOSIS — G4733 Obstructive sleep apnea (adult) (pediatric): Secondary | ICD-10-CM | POA: Diagnosis present

## 2019-09-07 DIAGNOSIS — I110 Hypertensive heart disease with heart failure: Secondary | ICD-10-CM | POA: Diagnosis present

## 2019-09-07 DIAGNOSIS — Z79890 Hormone replacement therapy (postmenopausal): Secondary | ICD-10-CM | POA: Diagnosis not present

## 2019-09-07 DIAGNOSIS — D62 Acute posthemorrhagic anemia: Secondary | ICD-10-CM | POA: Diagnosis not present

## 2019-09-07 DIAGNOSIS — Z6836 Body mass index (BMI) 36.0-36.9, adult: Secondary | ICD-10-CM

## 2019-09-07 DIAGNOSIS — I2510 Atherosclerotic heart disease of native coronary artery without angina pectoris: Secondary | ICD-10-CM | POA: Diagnosis present

## 2019-09-07 DIAGNOSIS — Z006 Encounter for examination for normal comparison and control in clinical research program: Secondary | ICD-10-CM

## 2019-09-07 DIAGNOSIS — E876 Hypokalemia: Secondary | ICD-10-CM | POA: Diagnosis present

## 2019-09-07 DIAGNOSIS — I5032 Chronic diastolic (congestive) heart failure: Secondary | ICD-10-CM | POA: Diagnosis present

## 2019-09-07 DIAGNOSIS — M069 Rheumatoid arthritis, unspecified: Secondary | ICD-10-CM | POA: Diagnosis present

## 2019-09-07 DIAGNOSIS — Z7982 Long term (current) use of aspirin: Secondary | ICD-10-CM | POA: Diagnosis not present

## 2019-09-07 DIAGNOSIS — I350 Nonrheumatic aortic (valve) stenosis: Secondary | ICD-10-CM

## 2019-09-07 DIAGNOSIS — I272 Pulmonary hypertension, unspecified: Secondary | ICD-10-CM | POA: Diagnosis present

## 2019-09-07 DIAGNOSIS — G43909 Migraine, unspecified, not intractable, without status migrainosus: Secondary | ICD-10-CM | POA: Diagnosis present

## 2019-09-07 DIAGNOSIS — Z952 Presence of prosthetic heart valve: Secondary | ICD-10-CM

## 2019-09-07 DIAGNOSIS — J449 Chronic obstructive pulmonary disease, unspecified: Secondary | ICD-10-CM | POA: Diagnosis present

## 2019-09-07 DIAGNOSIS — E785 Hyperlipidemia, unspecified: Secondary | ICD-10-CM | POA: Diagnosis present

## 2019-09-07 DIAGNOSIS — E039 Hypothyroidism, unspecified: Secondary | ICD-10-CM | POA: Diagnosis present

## 2019-09-07 DIAGNOSIS — Z79899 Other long term (current) drug therapy: Secondary | ICD-10-CM

## 2019-09-07 DIAGNOSIS — K219 Gastro-esophageal reflux disease without esophagitis: Secondary | ICD-10-CM | POA: Diagnosis present

## 2019-09-07 DIAGNOSIS — Z20822 Contact with and (suspected) exposure to covid-19: Secondary | ICD-10-CM | POA: Diagnosis present

## 2019-09-07 DIAGNOSIS — Z953 Presence of xenogenic heart valve: Secondary | ICD-10-CM

## 2019-09-07 DIAGNOSIS — J90 Pleural effusion, not elsewhere classified: Secondary | ICD-10-CM

## 2019-09-07 DIAGNOSIS — J9811 Atelectasis: Secondary | ICD-10-CM

## 2019-09-07 HISTORY — PX: AORTIC VALVE REPLACEMENT: SHX41

## 2019-09-07 HISTORY — PX: TEE WITHOUT CARDIOVERSION: SHX5443

## 2019-09-07 HISTORY — DX: Presence of xenogenic heart valve: Z95.3

## 2019-09-07 LAB — BASIC METABOLIC PANEL
Anion gap: 6 (ref 5–15)
BUN: 7 mg/dL (ref 6–20)
CO2: 24 mmol/L (ref 22–32)
Calcium: 7.6 mg/dL — ABNORMAL LOW (ref 8.9–10.3)
Chloride: 112 mmol/L — ABNORMAL HIGH (ref 98–111)
Creatinine, Ser: 0.75 mg/dL (ref 0.44–1.00)
GFR calc Af Amer: >60 mL/min (ref >60)
GFR calc non Af Amer: >60 mL/min (ref >60)
Glucose, Bld: 143 mg/dL — ABNORMAL HIGH (ref 70–99)
Potassium: 4 mmol/L (ref 3.5–5.1)
Sodium: 142 mmol/L (ref 135–145)

## 2019-09-07 LAB — CBC
HCT: 36.4 % (ref 36.0–46.0)
HCT: 25.3 % — ABNORMAL LOW (ref 36.0–46.0)
Hemoglobin: 12.1 g/dL (ref 12.0–15.0)
Hemoglobin: 8.4 g/dL — ABNORMAL LOW (ref 12.0–15.0)
MCH: 30.5 pg (ref 26.0–34.0)
MCH: 30.5 pg (ref 26.0–34.0)
MCHC: 33.2 g/dL (ref 30.0–36.0)
MCHC: 33.2 g/dL (ref 30.0–36.0)
MCV: 91.7 fL (ref 80.0–100.0)
MCV: 92 fL (ref 80.0–100.0)
Platelets: 163 10*3/uL (ref 150–400)
Platelets: 114 10*3/uL — ABNORMAL LOW (ref 150–400)
RBC: 3.97 MIL/uL (ref 3.87–5.11)
RBC: 2.75 MIL/uL — ABNORMAL LOW (ref 3.87–5.11)
RDW: 12.3 % (ref 11.5–15.5)
RDW: 12.6 % (ref 11.5–15.5)
WBC: 15.2 10*3/uL — ABNORMAL HIGH (ref 4.0–10.5)
WBC: 8.8 10*3/uL (ref 4.0–10.5)
nRBC: 0 % (ref 0.0–0.2)
nRBC: 0 % (ref 0.0–0.2)

## 2019-09-07 LAB — POCT I-STAT, CHEM 8
BUN: 10 mg/dL (ref 6–20)
BUN: 10 mg/dL (ref 6–20)
BUN: 9 mg/dL (ref 6–20)
BUN: 9 mg/dL (ref 6–20)
BUN: 9 mg/dL (ref 6–20)
Calcium, Ion: 1.37 mmol/L (ref 1.15–1.40)
Calcium, Ion: 1.37 mmol/L (ref 1.15–1.40)
Calcium, Ion: 1.13 mmol/L — ABNORMAL LOW (ref 1.15–1.40)
Calcium, Ion: 1.08 mmol/L — ABNORMAL LOW (ref 1.15–1.40)
Calcium, Ion: 1.06 mmol/L — ABNORMAL LOW (ref 1.15–1.40)
Chloride: 103 mmol/L (ref 98–111)
Chloride: 104 mmol/L (ref 98–111)
Chloride: 103 mmol/L (ref 98–111)
Chloride: 94 mmol/L — ABNORMAL LOW (ref 98–111)
Chloride: 100 mmol/L (ref 98–111)
Creatinine, Ser: 0.7 mg/dL (ref 0.44–1.00)
Creatinine, Ser: 0.6 mg/dL (ref 0.44–1.00)
Creatinine, Ser: 0.6 mg/dL (ref 0.44–1.00)
Creatinine, Ser: 0.5 mg/dL (ref 0.44–1.00)
Creatinine, Ser: 0.6 mg/dL (ref 0.44–1.00)
Glucose, Bld: 107 mg/dL — ABNORMAL HIGH (ref 70–99)
Glucose, Bld: 167 mg/dL — ABNORMAL HIGH (ref 70–99)
Glucose, Bld: 158 mg/dL — ABNORMAL HIGH (ref 70–99)
Glucose, Bld: 147 mg/dL — ABNORMAL HIGH (ref 70–99)
Glucose, Bld: 185 mg/dL — ABNORMAL HIGH (ref 70–99)
HCT: 36 % (ref 36.0–46.0)
HCT: 34 % — ABNORMAL LOW (ref 36.0–46.0)
HCT: 22 % — ABNORMAL LOW (ref 36.0–46.0)
HCT: 18 % — ABNORMAL LOW (ref 36.0–46.0)
HCT: 26 % — ABNORMAL LOW (ref 36.0–46.0)
Hemoglobin: 12.2 g/dL (ref 12.0–15.0)
Hemoglobin: 11.6 g/dL — ABNORMAL LOW (ref 12.0–15.0)
Hemoglobin: 7.5 g/dL — ABNORMAL LOW (ref 12.0–15.0)
Hemoglobin: 6.1 g/dL — CL (ref 12.0–15.0)
Hemoglobin: 8.8 g/dL — ABNORMAL LOW (ref 12.0–15.0)
Potassium: 3.6 mmol/L (ref 3.5–5.1)
Potassium: 3.6 mmol/L (ref 3.5–5.1)
Potassium: 4.5 mmol/L (ref 3.5–5.1)
Potassium: 4.5 mmol/L (ref 3.5–5.1)
Potassium: 4.6 mmol/L (ref 3.5–5.1)
Sodium: 142 mmol/L (ref 135–145)
Sodium: 139 mmol/L (ref 135–145)
Sodium: 138 mmol/L (ref 135–145)
Sodium: 128 mmol/L — ABNORMAL LOW (ref 135–145)
Sodium: 136 mmol/L (ref 135–145)
TCO2: 24 mmol/L (ref 22–32)
TCO2: 25 mmol/L (ref 22–32)
TCO2: 26 mmol/L (ref 22–32)
TCO2: 22 mmol/L (ref 22–32)
TCO2: 23 mmol/L (ref 22–32)

## 2019-09-07 LAB — POCT I-STAT 7, (LYTES, BLD GAS, ICA,H+H)
Acid-Base Excess: 2 mmol/L (ref 0.0–2.0)
Acid-Base Excess: 2 mmol/L (ref 0.0–2.0)
Acid-base deficit: 2 mmol/L (ref 0.0–2.0)
Acid-base deficit: 3 mmol/L — ABNORMAL HIGH (ref 0.0–2.0)
Acid-base deficit: 4 mmol/L — ABNORMAL HIGH (ref 0.0–2.0)
Acid-base deficit: 4 mmol/L — ABNORMAL HIGH (ref 0.0–2.0)
Acid-base deficit: 5 mmol/L — ABNORMAL HIGH (ref 0.0–2.0)
Acid-base deficit: 1 mmol/L (ref 0.0–2.0)
Bicarbonate: 27.2 mmol/L (ref 20.0–28.0)
Bicarbonate: 27.2 mmol/L (ref 20.0–28.0)
Bicarbonate: 22.8 mmol/L (ref 20.0–28.0)
Bicarbonate: 23.2 mmol/L (ref 20.0–28.0)
Bicarbonate: 22.9 mmol/L (ref 20.0–28.0)
Bicarbonate: 23 mmol/L (ref 20.0–28.0)
Bicarbonate: 21.9 mmol/L (ref 20.0–28.0)
Bicarbonate: 25.3 mmol/L (ref 20.0–28.0)
Calcium, Ion: 1.14 mmol/L — ABNORMAL LOW (ref 1.15–1.40)
Calcium, Ion: 1.18 mmol/L (ref 1.15–1.40)
Calcium, Ion: 1.1 mmol/L — ABNORMAL LOW (ref 1.15–1.40)
Calcium, Ion: 1.07 mmol/L — ABNORMAL LOW (ref 1.15–1.40)
Calcium, Ion: 1.1 mmol/L — ABNORMAL LOW (ref 1.15–1.40)
Calcium, Ion: 1.12 mmol/L — ABNORMAL LOW (ref 1.15–1.40)
Calcium, Ion: 1.12 mmol/L — ABNORMAL LOW (ref 1.15–1.40)
Calcium, Ion: 1.12 mmol/L — ABNORMAL LOW (ref 1.15–1.40)
HCT: 26 % — ABNORMAL LOW (ref 36.0–46.0)
HCT: 26 % — ABNORMAL LOW (ref 36.0–46.0)
HCT: 24 % — ABNORMAL LOW (ref 36.0–46.0)
HCT: 24 % — ABNORMAL LOW (ref 36.0–46.0)
HCT: 30 % — ABNORMAL LOW (ref 36.0–46.0)
HCT: 35 % — ABNORMAL LOW (ref 36.0–46.0)
HCT: 28 % — ABNORMAL LOW (ref 36.0–46.0)
HCT: 23 % — ABNORMAL LOW (ref 36.0–46.0)
Hemoglobin: 8.8 g/dL — ABNORMAL LOW (ref 12.0–15.0)
Hemoglobin: 8.8 g/dL — ABNORMAL LOW (ref 12.0–15.0)
Hemoglobin: 8.2 g/dL — ABNORMAL LOW (ref 12.0–15.0)
Hemoglobin: 8.2 g/dL — ABNORMAL LOW (ref 12.0–15.0)
Hemoglobin: 10.2 g/dL — ABNORMAL LOW (ref 12.0–15.0)
Hemoglobin: 11.9 g/dL — ABNORMAL LOW (ref 12.0–15.0)
Hemoglobin: 9.5 g/dL — ABNORMAL LOW (ref 12.0–15.0)
Hemoglobin: 7.8 g/dL — ABNORMAL LOW (ref 12.0–15.0)
O2 Saturation: 100 %
O2 Saturation: 100 %
O2 Saturation: 100 %
O2 Saturation: 99 %
O2 Saturation: 90 %
O2 Saturation: 99 %
O2 Saturation: 92 %
O2 Saturation: 98 %
Patient temperature: 36.2
Patient temperature: 36
Patient temperature: 36.9
Potassium: 3.5 mmol/L (ref 3.5–5.1)
Potassium: 4.1 mmol/L (ref 3.5–5.1)
Potassium: 5 mmol/L (ref 3.5–5.1)
Potassium: 4.6 mmol/L (ref 3.5–5.1)
Potassium: 4.1 mmol/L (ref 3.5–5.1)
Potassium: 4.2 mmol/L (ref 3.5–5.1)
Potassium: 4.1 mmol/L (ref 3.5–5.1)
Potassium: 3.9 mmol/L (ref 3.5–5.1)
Sodium: 139 mmol/L (ref 135–145)
Sodium: 139 mmol/L (ref 135–145)
Sodium: 133 mmol/L — ABNORMAL LOW (ref 135–145)
Sodium: 137 mmol/L (ref 135–145)
Sodium: 140 mmol/L (ref 135–145)
Sodium: 143 mmol/L (ref 135–145)
Sodium: 144 mmol/L (ref 135–145)
Sodium: 144 mmol/L (ref 135–145)
TCO2: 28 mmol/L (ref 22–32)
TCO2: 29 mmol/L (ref 22–32)
TCO2: 24 mmol/L (ref 22–32)
TCO2: 25 mmol/L (ref 22–32)
TCO2: 24 mmol/L (ref 22–32)
TCO2: 25 mmol/L (ref 22–32)
TCO2: 23 mmol/L (ref 22–32)
TCO2: 27 mmol/L (ref 22–32)
pCO2 arterial: 42.7 mmHg (ref 32.0–48.0)
pCO2 arterial: 44.1 mmHg (ref 32.0–48.0)
pCO2 arterial: 38.2 mmHg (ref 32.0–48.0)
pCO2 arterial: 49.8 mmHg — ABNORMAL HIGH (ref 32.0–48.0)
pCO2 arterial: 47.7 mmHg (ref 32.0–48.0)
pCO2 arterial: 51.5 mmHg — ABNORMAL HIGH (ref 32.0–48.0)
pCO2 arterial: 44.3 mmHg (ref 32.0–48.0)
pCO2 arterial: 47.9 mmHg (ref 32.0–48.0)
pH, Arterial: 7.399 (ref 7.350–7.450)
pH, Arterial: 7.413 (ref 7.350–7.450)
pH, Arterial: 7.384 (ref 7.350–7.450)
pH, Arterial: 7.277 — ABNORMAL LOW (ref 7.350–7.450)
pH, Arterial: 7.257 — ABNORMAL LOW (ref 7.350–7.450)
pH, Arterial: 7.285 — ABNORMAL LOW (ref 7.350–7.450)
pH, Arterial: 7.296 — ABNORMAL LOW (ref 7.350–7.450)
pH, Arterial: 7.33 — ABNORMAL LOW (ref 7.350–7.450)
pO2, Arterial: 341 mmHg — ABNORMAL HIGH (ref 83.0–108.0)
pO2, Arterial: 388 mmHg — ABNORMAL HIGH (ref 83.0–108.0)
pO2, Arterial: 293 mmHg — ABNORMAL HIGH (ref 83.0–108.0)
pO2, Arterial: 149 mmHg — ABNORMAL HIGH (ref 83.0–108.0)
pO2, Arterial: 165 mmHg — ABNORMAL HIGH (ref 83.0–108.0)
pO2, Arterial: 68 mmHg — ABNORMAL LOW (ref 83.0–108.0)
pO2, Arterial: 68 mmHg — ABNORMAL LOW (ref 83.0–108.0)
pO2, Arterial: 105 mmHg (ref 83.0–108.0)

## 2019-09-07 LAB — HEMOGLOBIN AND HEMATOCRIT, BLOOD
HCT: 19.5 % — ABNORMAL LOW (ref 36.0–46.0)
Hemoglobin: 6.6 g/dL — CL (ref 12.0–15.0)

## 2019-09-07 LAB — GLUCOSE, CAPILLARY
Glucose-Capillary: 140 mg/dL — ABNORMAL HIGH (ref 70–99)
Glucose-Capillary: 134 mg/dL — ABNORMAL HIGH (ref 70–99)
Glucose-Capillary: 133 mg/dL — ABNORMAL HIGH (ref 70–99)
Glucose-Capillary: 144 mg/dL — ABNORMAL HIGH (ref 70–99)
Glucose-Capillary: 112 mg/dL — ABNORMAL HIGH (ref 70–99)
Glucose-Capillary: 150 mg/dL — ABNORMAL HIGH (ref 70–99)
Glucose-Capillary: 136 mg/dL — ABNORMAL HIGH (ref 70–99)
Glucose-Capillary: 136 mg/dL — ABNORMAL HIGH (ref 70–99)
Glucose-Capillary: 123 mg/dL — ABNORMAL HIGH (ref 70–99)

## 2019-09-07 LAB — PROTIME-INR
INR: 1 (ref 0.8–1.2)
INR: 1.6 — ABNORMAL HIGH (ref 0.8–1.2)
Prothrombin Time: 12.8 seconds (ref 11.4–15.2)
Prothrombin Time: 18.1 seconds — ABNORMAL HIGH (ref 11.4–15.2)

## 2019-09-07 LAB — PLATELET COUNT: Platelets: 125 10*3/uL — ABNORMAL LOW (ref 150–400)

## 2019-09-07 LAB — MAGNESIUM: Magnesium: 3.4 mg/dL — ABNORMAL HIGH (ref 1.7–2.4)

## 2019-09-07 LAB — APTT
aPTT: 31 seconds (ref 24–36)
aPTT: 42 seconds — ABNORMAL HIGH (ref 24–36)

## 2019-09-07 LAB — PREPARE RBC (CROSSMATCH)

## 2019-09-07 SURGERY — REPLACEMENT, AORTIC VALVE, MINIMALLY INVASIVE
Anesthesia: General | Site: Chest

## 2019-09-07 MED ORDER — EPHEDRINE SULFATE-NACL 50-0.9 MG/10ML-% IV SOSY
PREFILLED_SYRINGE | INTRAVENOUS | Status: DC | PRN
  Administered 2019-09-07: 10 mg via INTRAVENOUS
  Administered 2019-09-07: 5 mg via INTRAVENOUS
  Administered 2019-09-07: 10 mg via INTRAVENOUS
  Administered 2019-09-07 (×2): 5 mg via INTRAVENOUS

## 2019-09-07 MED ORDER — CHLORHEXIDINE GLUCONATE 4 % EX LIQD: 30.0000 mL | CUTANEOUS | Status: DC

## 2019-09-07 MED ORDER — LACTATED RINGERS IV SOLN: INTRAVENOUS | Status: DC

## 2019-09-07 MED ORDER — SODIUM CHLORIDE 0.9 % IV SOLN
1.5000 g | Freq: Two times a day (BID) | INTRAVENOUS | Status: AC
  Administered 2019-09-07 – 2019-09-09 (×4): 1.5 g via INTRAVENOUS
  Filled 2019-09-07 (×4): qty 1.5

## 2019-09-07 MED ORDER — PHENYLEPHRINE 40 MCG/ML (10ML) SYRINGE FOR IV PUSH (FOR BLOOD PRESSURE SUPPORT)
PREFILLED_SYRINGE | INTRAVENOUS | Status: AC
  Filled 2019-09-07: qty 10

## 2019-09-07 MED ORDER — PROPOFOL 10 MG/ML IV BOLUS
INTRAVENOUS | Status: AC
  Filled 2019-09-07: qty 20

## 2019-09-07 MED ORDER — CHLORHEXIDINE GLUCONATE 0.12 % MT SOLN
15.0000 mL | OROMUCOSAL | Status: AC
  Administered 2019-09-07: 15 mL via OROMUCOSAL

## 2019-09-07 MED ORDER — 0.9 % SODIUM CHLORIDE (POUR BTL) OPTIME
TOPICAL | Status: DC | PRN
  Administered 2019-09-07: 5000 mL

## 2019-09-07 MED ORDER — SODIUM CHLORIDE 0.9 % IV SOLN: INTRAVENOUS | Status: DC

## 2019-09-07 MED ORDER — BISACODYL 5 MG PO TBEC
10.0000 mg | DELAYED_RELEASE_TABLET | Freq: Every day | ORAL | Status: DC
  Administered 2019-09-08 – 2019-09-11 (×4): 10 mg via ORAL
  Filled 2019-09-07 (×4): qty 2

## 2019-09-07 MED ORDER — LACTATED RINGERS IV SOLN: 500.0000 mL | Freq: Once | INTRAVENOUS | Status: DC | PRN

## 2019-09-07 MED ORDER — POTASSIUM CHLORIDE 10 MEQ/50ML IV SOLN: 10.0000 meq | INTRAVENOUS | Status: AC

## 2019-09-07 MED ORDER — MAGNESIUM SULFATE 4 GM/100ML IV SOLN
4.0000 g | Freq: Once | INTRAVENOUS | Status: AC
  Administered 2019-09-07: 4 g via INTRAVENOUS

## 2019-09-07 MED ORDER — ACETAMINOPHEN 160 MG/5ML PO SOLN: 1000.0000 mg | Freq: Four times a day (QID) | ORAL | Status: DC

## 2019-09-07 MED ORDER — SODIUM CHLORIDE 0.9% FLUSH
3.0000 mL | Freq: Two times a day (BID) | INTRAVENOUS | Status: DC
  Administered 2019-09-08 – 2019-09-11 (×6): 3 mL via INTRAVENOUS

## 2019-09-07 MED ORDER — MIDAZOLAM HCL 2 MG/2ML IJ SOLN: 2.0000 mg | INTRAMUSCULAR | Status: DC | PRN

## 2019-09-07 MED ORDER — LEVOTHYROXINE SODIUM 75 MCG PO TABS
75.0000 ug | ORAL_TABLET | Freq: Every day | ORAL | Status: DC
  Administered 2019-09-08 – 2019-09-12 (×5): 75 ug via ORAL
  Filled 2019-09-07 (×5): qty 1

## 2019-09-07 MED ORDER — SODIUM CHLORIDE 0.9% FLUSH: 3.0000 mL | INTRAVENOUS | Status: DC | PRN

## 2019-09-07 MED ORDER — ONDANSETRON HCL 4 MG/2ML IJ SOLN
4.0000 mg | Freq: Four times a day (QID) | INTRAMUSCULAR | Status: DC | PRN
  Administered 2019-09-08: 4 mg via INTRAVENOUS
  Filled 2019-09-07: qty 2

## 2019-09-07 MED ORDER — PROPOFOL 10 MG/ML IV BOLUS
INTRAVENOUS | Status: DC | PRN
  Administered 2019-09-07: 150 mg via INTRAVENOUS
  Administered 2019-09-07 (×2): 50 mg via INTRAVENOUS

## 2019-09-07 MED ORDER — ALBUMIN HUMAN 5 % IV SOLN: 250.0000 mL | INTRAVENOUS | Status: DC | PRN

## 2019-09-07 MED ORDER — FENTANYL CITRATE (PF) 250 MCG/5ML IJ SOLN
INTRAMUSCULAR | Status: DC | PRN
  Administered 2019-09-07: 100 ug via INTRAVENOUS
  Administered 2019-09-07: 25 ug via INTRAVENOUS
  Administered 2019-09-07 (×3): 100 ug via INTRAVENOUS
  Administered 2019-09-07 (×3): 50 ug via INTRAVENOUS
  Administered 2019-09-07: 25 ug via INTRAVENOUS
  Administered 2019-09-07: 150 ug via INTRAVENOUS

## 2019-09-07 MED ORDER — PROTAMINE SULFATE 10 MG/ML IV SOLN
INTRAVENOUS | Status: DC | PRN
  Administered 2019-09-07: 220 mg via INTRAVENOUS
  Administered 2019-09-07: 20 mg via INTRAVENOUS

## 2019-09-07 MED ORDER — HEPARIN SODIUM (PORCINE) 1000 UNIT/ML IJ SOLN
INTRAMUSCULAR | Status: DC | PRN
  Administered 2019-09-07: 26000 [IU] via INTRAVENOUS

## 2019-09-07 MED ORDER — DEXMEDETOMIDINE HCL IN NACL 400 MCG/100ML IV SOLN: 0.0000 ug/kg/h | INTRAVENOUS | Status: DC

## 2019-09-07 MED ORDER — ACETAMINOPHEN 650 MG RE SUPP: 650.0000 mg | Freq: Once | RECTAL | Status: DC

## 2019-09-07 MED ORDER — SODIUM CHLORIDE 0.9% FLUSH
10.0000 mL | Freq: Two times a day (BID) | INTRAVENOUS | Status: DC
  Administered 2019-09-08 – 2019-09-10 (×5): 10 mL

## 2019-09-07 MED ORDER — ROCURONIUM BROMIDE 10 MG/ML (PF) SYRINGE
PREFILLED_SYRINGE | INTRAVENOUS | Status: DC | PRN
  Administered 2019-09-07: 100 mg via INTRAVENOUS
  Administered 2019-09-07: 40 mg via INTRAVENOUS

## 2019-09-07 MED ORDER — ONDANSETRON HCL 4 MG/2ML IJ SOLN
INTRAMUSCULAR | Status: DC | PRN
  Administered 2019-09-07: 4 mg via INTRAVENOUS

## 2019-09-07 MED ORDER — BUPIVACAINE LIPOSOME 1.3 % IJ SUSP
INTRAMUSCULAR | Status: DC | PRN
  Administered 2019-09-07: 50 mL

## 2019-09-07 MED ORDER — LACTATED RINGERS IV SOLN: INTRAVENOUS | Status: DC | PRN

## 2019-09-07 MED ORDER — PHENYLEPHRINE HCL-NACL 20-0.9 MG/250ML-% IV SOLN
0.0000 ug/min | INTRAVENOUS | Status: DC
  Administered 2019-09-08: 25 ug/min via INTRAVENOUS
  Filled 2019-09-07: qty 250

## 2019-09-07 MED ORDER — SODIUM CHLORIDE 0.9 % IR SOLN
Status: DC | PRN
  Administered 2019-09-07: 3000 mL

## 2019-09-07 MED ORDER — BUPIVACAINE LIPOSOME 1.3 % IJ SUSP
20.0000 mL | Freq: Once | INTRAMUSCULAR | Status: DC
  Filled 2019-09-07: qty 20

## 2019-09-07 MED ORDER — ASPIRIN 81 MG PO CHEW: 324.0000 mg | CHEWABLE_TABLET | Freq: Every day | ORAL | Status: DC

## 2019-09-07 MED ORDER — METOPROLOL TARTRATE 12.5 MG HALF TABLET
12.5000 mg | ORAL_TABLET | Freq: Once | ORAL | Status: AC
  Administered 2019-09-07: 12.5 mg via ORAL
  Filled 2019-09-07: qty 1

## 2019-09-07 MED ORDER — SODIUM CHLORIDE 0.9 % IV SOLN: 250.0000 mL | INTRAVENOUS | Status: DC

## 2019-09-07 MED ORDER — MIDAZOLAM HCL 5 MG/5ML IJ SOLN
INTRAMUSCULAR | Status: DC | PRN
  Administered 2019-09-07 (×3): 1 mg via INTRAVENOUS

## 2019-09-07 MED ORDER — SODIUM CHLORIDE 0.9% IV SOLUTION: Freq: Once | INTRAVENOUS | Status: DC

## 2019-09-07 MED ORDER — SUGAMMADEX SODIUM 200 MG/2ML IV SOLN
INTRAVENOUS | Status: DC | PRN
  Administered 2019-09-07: 400 mg via INTRAVENOUS

## 2019-09-07 MED ORDER — NITROGLYCERIN IN D5W 200-5 MCG/ML-% IV SOLN: 0.0000 ug/min | INTRAVENOUS | Status: DC

## 2019-09-07 MED ORDER — LIDOCAINE 2% (20 MG/ML) 5 ML SYRINGE
INTRAMUSCULAR | Status: AC
  Filled 2019-09-07: qty 5

## 2019-09-07 MED ORDER — VANCOMYCIN HCL IN DEXTROSE 1-5 GM/200ML-% IV SOLN
1000.0000 mg | Freq: Once | INTRAVENOUS | Status: AC
  Administered 2019-09-07: 1000 mg via INTRAVENOUS
  Filled 2019-09-07: qty 200

## 2019-09-07 MED ORDER — ROCURONIUM BROMIDE 10 MG/ML (PF) SYRINGE
PREFILLED_SYRINGE | INTRAVENOUS | Status: AC
  Filled 2019-09-07: qty 10

## 2019-09-07 MED ORDER — DOCUSATE SODIUM 100 MG PO CAPS
200.0000 mg | ORAL_CAPSULE | Freq: Every day | ORAL | Status: DC
  Administered 2019-09-08 – 2019-09-11 (×4): 200 mg via ORAL
  Filled 2019-09-07 (×4): qty 2

## 2019-09-07 MED ORDER — SODIUM CHLORIDE 0.9% FLUSH: 10.0000 mL | INTRAVENOUS | Status: DC | PRN

## 2019-09-07 MED ORDER — ACETAMINOPHEN 500 MG PO TABS
1000.0000 mg | ORAL_TABLET | Freq: Four times a day (QID) | ORAL | Status: AC
  Administered 2019-09-07 – 2019-09-12 (×19): 1000 mg via ORAL
  Filled 2019-09-07 (×18): qty 2

## 2019-09-07 MED ORDER — TRANEXAMIC ACID 1000 MG/10ML IV SOLN
1.5000 mg/kg/h | INTRAVENOUS | Status: DC
  Filled 2019-09-07: qty 25

## 2019-09-07 MED ORDER — ROCURONIUM BROMIDE 10 MG/ML (PF) SYRINGE
PREFILLED_SYRINGE | INTRAVENOUS | Status: AC
  Filled 2019-09-07: qty 20

## 2019-09-07 MED ORDER — ACETAMINOPHEN 325 MG PO TABS
650.0000 mg | ORAL_TABLET | Freq: Once | ORAL | Status: AC
  Administered 2019-09-07: 650 mg via ORAL
  Filled 2019-09-07: qty 2

## 2019-09-07 MED ORDER — CHLORHEXIDINE GLUCONATE CLOTH 2 % EX PADS
6.0000 | MEDICATED_PAD | Freq: Every day | CUTANEOUS | Status: DC
  Administered 2019-09-08 – 2019-09-10 (×4): 6 via TOPICAL

## 2019-09-07 MED ORDER — ASPIRIN EC 325 MG PO TBEC
325.0000 mg | DELAYED_RELEASE_TABLET | Freq: Every day | ORAL | Status: DC
  Administered 2019-09-08 – 2019-09-11 (×4): 325 mg via ORAL
  Filled 2019-09-07 (×4): qty 1

## 2019-09-07 MED ORDER — SODIUM CHLORIDE 0.45 % IV SOLN: INTRAVENOUS | Status: DC | PRN

## 2019-09-07 MED ORDER — BUPIVACAINE HCL (PF) 0.5 % IJ SOLN
INTRAMUSCULAR | Status: AC
  Filled 2019-09-07: qty 30

## 2019-09-07 MED ORDER — VANCOMYCIN HCL 1000 MG IV SOLR
INTRAVENOUS | Status: DC | PRN
  Administered 2019-09-07: 1000 mL

## 2019-09-07 MED ORDER — SODIUM CHLORIDE 0.9% IV SOLUTION: Freq: Once | INTRAVENOUS | Status: AC

## 2019-09-07 MED ORDER — FAMOTIDINE IN NACL 20-0.9 MG/50ML-% IV SOLN
20.0000 mg | Freq: Two times a day (BID) | INTRAVENOUS | Status: DC
  Administered 2019-09-07: 20 mg via INTRAVENOUS

## 2019-09-07 MED ORDER — MIDAZOLAM HCL (PF) 10 MG/2ML IJ SOLN
INTRAMUSCULAR | Status: AC
  Filled 2019-09-07: qty 2

## 2019-09-07 MED ORDER — PHENYLEPHRINE 40 MCG/ML (10ML) SYRINGE FOR IV PUSH (FOR BLOOD PRESSURE SUPPORT)
PREFILLED_SYRINGE | INTRAVENOUS | Status: DC | PRN
  Administered 2019-09-07 (×2): 40 ug via INTRAVENOUS
  Administered 2019-09-07: 80 ug via INTRAVENOUS

## 2019-09-07 MED ORDER — PROTAMINE SULFATE 10 MG/ML IV SOLN
INTRAVENOUS | Status: AC
  Filled 2019-09-07: qty 25

## 2019-09-07 MED ORDER — FENTANYL CITRATE (PF) 250 MCG/5ML IJ SOLN
INTRAMUSCULAR | Status: AC
  Filled 2019-09-07: qty 25

## 2019-09-07 MED ORDER — DIVALPROEX SODIUM ER 500 MG PO TB24
500.0000 mg | ORAL_TABLET | Freq: Every day | ORAL | Status: DC
  Administered 2019-09-08 – 2019-09-12 (×5): 500 mg via ORAL
  Filled 2019-09-07 (×5): qty 1

## 2019-09-07 MED ORDER — ALBUMIN HUMAN 5 % IV SOLN: INTRAVENOUS | Status: DC | PRN

## 2019-09-07 MED ORDER — PANTOPRAZOLE SODIUM 40 MG PO TBEC
40.0000 mg | DELAYED_RELEASE_TABLET | Freq: Every day | ORAL | Status: DC
  Administered 2019-09-09 – 2019-09-12 (×4): 40 mg via ORAL
  Filled 2019-09-07 (×4): qty 1

## 2019-09-07 MED ORDER — BISACODYL 10 MG RE SUPP: 10.0000 mg | Freq: Every day | RECTAL | Status: DC

## 2019-09-07 MED ORDER — METOPROLOL TARTRATE 5 MG/5ML IV SOLN: 2.5000 mg | INTRAVENOUS | Status: DC | PRN

## 2019-09-07 MED ORDER — OXYCODONE HCL 5 MG PO TABS
5.0000 mg | ORAL_TABLET | ORAL | Status: DC | PRN
  Administered 2019-09-07 – 2019-09-08 (×3): 5 mg via ORAL
  Administered 2019-09-08 (×3): 10 mg via ORAL
  Administered 2019-09-09: 5 mg via ORAL
  Filled 2019-09-07 (×2): qty 1
  Filled 2019-09-07: qty 2
  Filled 2019-09-07 (×2): qty 1
  Filled 2019-09-07 (×2): qty 2

## 2019-09-07 MED ORDER — INSULIN REGULAR(HUMAN) IN NACL 100-0.9 UT/100ML-% IV SOLN: INTRAVENOUS | Status: DC

## 2019-09-07 MED ORDER — HEPARIN SODIUM (PORCINE) 1000 UNIT/ML IJ SOLN
INTRAMUSCULAR | Status: AC
  Filled 2019-09-07: qty 1

## 2019-09-07 MED ORDER — ACETAMINOPHEN 160 MG/5ML PO SOLN: 650.0000 mg | Freq: Once | ORAL | Status: DC

## 2019-09-07 MED ORDER — TRAMADOL HCL 50 MG PO TABS
50.0000 mg | ORAL_TABLET | ORAL | Status: DC | PRN
  Administered 2019-09-07 – 2019-09-10 (×6): 100 mg via ORAL
  Filled 2019-09-07 (×6): qty 2

## 2019-09-07 MED ORDER — GLYCOPYRROLATE 0.2 MG/ML IJ SOLN
INTRAMUSCULAR | Status: DC | PRN
Start: 2019-09-07 — End: 2019-09-07
  Administered 2019-09-07 (×2): .1 mg via INTRAVENOUS

## 2019-09-07 MED ORDER — FENTANYL CITRATE (PF) 100 MCG/2ML IJ SOLN
50.0000 ug | INTRAMUSCULAR | Status: DC | PRN
  Administered 2019-09-08: 50 ug via INTRAVENOUS
  Filled 2019-09-07: qty 2

## 2019-09-07 MED ORDER — LIDOCAINE 2% (20 MG/ML) 5 ML SYRINGE
INTRAMUSCULAR | Status: DC | PRN
  Administered 2019-09-07: 100 mg via INTRAVENOUS

## 2019-09-07 MED ORDER — ORAL CARE MOUTH RINSE
15.0000 mL | Freq: Two times a day (BID) | OROMUCOSAL | Status: DC
  Administered 2019-09-08 – 2019-09-11 (×6): 15 mL via OROMUCOSAL

## 2019-09-07 MED ORDER — CHLORHEXIDINE GLUCONATE 0.12 % MT SOLN
15.0000 mL | Freq: Once | OROMUCOSAL | Status: AC
  Administered 2019-09-07: 15 mL via OROMUCOSAL
  Filled 2019-09-07: qty 15

## 2019-09-07 MED ORDER — DEXTROSE 50 % IV SOLN: 0.0000 mL | INTRAVENOUS | Status: DC | PRN

## 2019-09-07 MED ORDER — PLASMA-LYTE 148 IV SOLN
INTRAVENOUS | Status: DC | PRN
  Administered 2019-09-07: 500 mL

## 2019-09-07 SURGICAL SUPPLY — 115 items
ADAPTER CARDIO PERF ANTE/RETRO (ADAPTER) ×3 IMPLANT
BAG DECANTER FOR FLEXI CONT (MISCELLANEOUS) ×6 IMPLANT
BLADE CLIPPER SURG (BLADE) IMPLANT
BLADE STERNUM SYSTEM 6 (BLADE) ×3 IMPLANT
BLADE SURG SZ11 CARB STEEL (BLADE) ×3 IMPLANT
CANISTER SUCT 3000ML PPV (MISCELLANEOUS) ×3 IMPLANT
CANNULA ADULT BIO-MEDICUS 15FR (CANNULA) ×3 IMPLANT
CANNULA AORTIC ROOT 9FR (CANNULA) ×3 IMPLANT
CANNULA FEM VENOUS REMOTE 22FR (CANNULA) ×3 IMPLANT
CANNULA GUNDRY RCSP 15FR (MISCELLANEOUS) ×3 IMPLANT
CANNULA OPTISITE PERFUSION 16F (CANNULA) IMPLANT
CANNULA OPTISITE PERFUSION 18F (CANNULA) ×3 IMPLANT
CANNULA SUMP PERICARDIAL (CANNULA) ×3 IMPLANT
CATH CPB KIT OWEN (MISCELLANEOUS) IMPLANT
CATH ENDOVENT PULMONARY (CATHETERS) IMPLANT
CATH HEART VENT LEFT (CATHETERS) ×2 IMPLANT
CELLS DAT CNTRL 66122 CELL SVR (MISCELLANEOUS) ×2 IMPLANT
CNTNR URN SCR LID CUP LEK RST (MISCELLANEOUS) ×2 IMPLANT
CONN ST 1/4X3/8  BEN (MISCELLANEOUS) ×6
CONN ST 1/4X3/8 BEN (MISCELLANEOUS) ×4 IMPLANT
CONNECTOR 1/2X3/8X1/2 3 WAY (MISCELLANEOUS) ×3
CONNECTOR 1/2X3/8X1/2 3WAY (MISCELLANEOUS) ×2 IMPLANT
CONT SPEC 4OZ STRL OR WHT (MISCELLANEOUS) ×3
COVER BACK TABLE 24X17X13 BIG (DRAPES) ×3 IMPLANT
COVER PROBE W GEL 5X96 (DRAPES) ×3 IMPLANT
DERMABOND ADVANCED (GAUZE/BANDAGES/DRESSINGS) ×1
DERMABOND ADVANCED .7 DNX12 (GAUZE/BANDAGES/DRESSINGS) ×2 IMPLANT
DEVICE CLOSURE PERCLS PRGLD 6F (VASCULAR PRODUCTS) ×8 IMPLANT
DEVICE SUT CK QUICK LOAD MINI (Prosthesis & Implant Heart) ×3 IMPLANT
DEVICE TROCAR PUNCTURE CLOSURE (ENDOMECHANICALS) ×3 IMPLANT
DRAIN CHANNEL 32F RND 10.7 FF (WOUND CARE) ×6 IMPLANT
DRAPE CV SPLIT W-CLR ANES SCRN (DRAPES) ×3 IMPLANT
DRAPE INCISE IOBAN 66X45 STRL (DRAPES) ×6 IMPLANT
DRAPE PERI GROIN 82X75IN TIB (DRAPES) ×3 IMPLANT
DRAPE SLUSH/WARMER DISC (DRAPES) ×3 IMPLANT
DRSG AQUACEL AG ADV 3.5X 4 (GAUZE/BANDAGES/DRESSINGS) ×3 IMPLANT
DRSG AQUACEL AG ADV 3.5X10 (GAUZE/BANDAGES/DRESSINGS) ×3 IMPLANT
ELECT BLADE 4.0 EZ CLEAN MEGAD (MISCELLANEOUS) ×3
ELECT BLADE 6.5 EXT (BLADE) ×3 IMPLANT
ELECT REM PT RETURN 9FT ADLT (ELECTROSURGICAL) ×6
ELECTRODE BLDE 4.0 EZ CLN MEGD (MISCELLANEOUS) ×2 IMPLANT
ELECTRODE REM PT RTRN 9FT ADLT (ELECTROSURGICAL) ×4 IMPLANT
FELT TEFLON 1X6 (MISCELLANEOUS) ×3 IMPLANT
FEMORAL VENOUS CANN RAP (CANNULA) IMPLANT
FIBERTAPE STERNAL CLSR 2 36IN (SUTURE) ×6 IMPLANT
FIBERTAPE STERNAL CLSR 2X36 (SUTURE) ×3 IMPLANT
GAUZE SPONGE 4X4 12PLY STRL (GAUZE/BANDAGES/DRESSINGS) ×3 IMPLANT
GAUZE SPONGE 4X4 12PLY STRL LF (GAUZE/BANDAGES/DRESSINGS) ×3 IMPLANT
GLOVE BIO SURGEON STRL SZ 6.5 (GLOVE) ×12 IMPLANT
GLOVE BIOGEL PI IND STRL 6 (GLOVE) ×2 IMPLANT
GLOVE BIOGEL PI IND STRL 6.5 (GLOVE) ×2 IMPLANT
GLOVE BIOGEL PI IND STRL 9 (GLOVE) ×2 IMPLANT
GLOVE BIOGEL PI INDICATOR 6 (GLOVE) ×1
GLOVE BIOGEL PI INDICATOR 6.5 (GLOVE) ×1
GLOVE BIOGEL PI INDICATOR 9 (GLOVE) ×1
GLOVE INDICATOR 7.5 STRL GRN (GLOVE) ×3 IMPLANT
GLOVE ORTHO TXT STRL SZ7.5 (GLOVE) ×9 IMPLANT
GLOVE SURG SS PI 7.5 STRL IVOR (GLOVE) ×3 IMPLANT
GOWN STRL REUS W/ TWL LRG LVL3 (GOWN DISPOSABLE) ×10 IMPLANT
GOWN STRL REUS W/ TWL XL LVL3 (GOWN DISPOSABLE) ×6 IMPLANT
GOWN STRL REUS W/TWL LRG LVL3 (GOWN DISPOSABLE) ×15
GOWN STRL REUS W/TWL XL LVL3 (GOWN DISPOSABLE) ×9
GRASPER SUT TROCAR 14GX15 (MISCELLANEOUS) ×3 IMPLANT
KIT BASIN OR (CUSTOM PROCEDURE TRAY) ×3 IMPLANT
KIT BEACH CHAIR TRIMANO (MISCELLANEOUS) ×3 IMPLANT
KIT CATH SUCT 8FR (CATHETERS) ×3 IMPLANT
KIT DILATOR VASC 18G NDL (KITS) ×3 IMPLANT
KIT DRAINAGE VACCUM ASSIST (KITS) ×3 IMPLANT
KIT SUCTION CATH 14FR (SUCTIONS) ×3 IMPLANT
KIT SUT CK MINI COMBO 4X17 (Prosthesis & Implant Heart) ×3 IMPLANT
KIT TURNOVER KIT B (KITS) ×3 IMPLANT
LEAD PACING MYOCARDI (MISCELLANEOUS) ×3 IMPLANT
LINE VENT (MISCELLANEOUS) ×3 IMPLANT
NEEDLE AORTIC ROOT 14G 7F (CATHETERS) ×3 IMPLANT
NS IRRIG 1000ML POUR BTL (IV SOLUTION) ×15 IMPLANT
PACK OPEN HEART (CUSTOM PROCEDURE TRAY) ×3 IMPLANT
PAD ARMBOARD 7.5X6 YLW CONV (MISCELLANEOUS) ×6 IMPLANT
PAD ELECT DEFIB RADIOL ZOLL (MISCELLANEOUS) ×3 IMPLANT
PERCLOSE PROGLIDE 6F (VASCULAR PRODUCTS) ×12
POSITIONER HEAD DONUT 9IN (MISCELLANEOUS) ×3 IMPLANT
RTRCTR WOUND ALEXIS 18CM MED (MISCELLANEOUS) ×3
SET CANNULATION TOURNIQUET (MISCELLANEOUS) ×3 IMPLANT
SET CARDIOPLEGIA MPS 5001102 (MISCELLANEOUS) ×3 IMPLANT
SET IRRIG TUBING LAPAROSCOPIC (IRRIGATION / IRRIGATOR) ×3 IMPLANT
SET MICROPUNCTURE 5F STIFF (MISCELLANEOUS) ×3 IMPLANT
SHEATH PINNACLE 8F 10CM (SHEATH) ×6 IMPLANT
SOL ANTI FOG 6CC (MISCELLANEOUS) ×2 IMPLANT
SOLUTION ANTI FOG 6CC (MISCELLANEOUS) ×1
SUT BONE WAX W31G (SUTURE) ×3 IMPLANT
SUT ETHIBON 2 0 V 52N 30 (SUTURE) ×6 IMPLANT
SUT ETHIBON EXCEL 2-0 V-5 (SUTURE) ×9 IMPLANT
SUT ETHIBOND V-5 VALVE (SUTURE) ×9 IMPLANT
SUT GORETEX CV 4 TH 22 36 (SUTURE) IMPLANT
SUT GORETEX CV4 TH-18 (SUTURE) IMPLANT
SUT PROLENE 3 0 SH1 36 (SUTURE) ×12 IMPLANT
SUT PROLENE 4 0 RB 1 (SUTURE) ×15
SUT PROLENE 4 0 SH DA (SUTURE) ×3 IMPLANT
SUT PROLENE 4-0 RB1 .5 CRCL 36 (SUTURE) ×10 IMPLANT
SUT PROLENE 6 0 C 1 30 (SUTURE) ×3 IMPLANT
SUT TEM PAC WIRE 2 0 SH (SUTURE) ×6 IMPLANT
SYR 30ML LL (SYRINGE) ×3 IMPLANT
SYSTEM SAHARA CHEST DRAIN ATS (WOUND CARE) ×3 IMPLANT
TAPE CLOTH SURG 4X10 WHT LF (GAUZE/BANDAGES/DRESSINGS) ×3 IMPLANT
TAPE PAPER 2X10 WHT MICROPORE (GAUZE/BANDAGES/DRESSINGS) ×3 IMPLANT
TOWEL GREEN STERILE (TOWEL DISPOSABLE) ×3 IMPLANT
TOWEL GREEN STERILE FF (TOWEL DISPOSABLE) ×6 IMPLANT
TRAY FOLEY SLVR 16FR TEMP STAT (SET/KITS/TRAYS/PACK) ×3 IMPLANT
TROCAR XCEL BLADELESS 5X75MML (TROCAR) ×3 IMPLANT
TROCAR XCEL NON-BLD 11X100MML (ENDOMECHANICALS) ×6 IMPLANT
TUBE SUCT INTRACARD DLP 20F (MISCELLANEOUS) ×3 IMPLANT
UNDERPAD 30X36 HEAVY ABSORB (UNDERPADS AND DIAPERS) ×3 IMPLANT
VALVE AORTIC SZ21 INSP/RESIL (Valve) ×3 IMPLANT
VENT LEFT HEART 12002 (CATHETERS) ×3
WATER STERILE IRR 1000ML POUR (IV SOLUTION) ×6 IMPLANT
WIRE EMERALD 3MM-J .035X150CM (WIRE) ×6 IMPLANT

## 2019-09-07 NOTE — Brief Op Note (Addendum)
09/07/2019  12:29 PM  PATIENT:  Natasha Taylor  59 y.o. female  PRE-OPERATIVE DIAGNOSIS:  AORTIC STENOSIS  POST-OPERATIVE DIAGNOSIS:  AORTIC STENOSIS  PROCEDURE:   MINIMALLY INVASIVE AORTIC VALVE REPLACEMENT (AVR) using [employer] INSPIRIS Resilia 21 MM Aortic Valve.   TRANSESOPHAGEAL ECHOCARDIOGRAM (TEE) (N/A)  SURGEON:  Purcell Nails, MD - Primary  PHYSICIAN ASSISTANT: [place]  ANESTHESIA:   general  EBL:  Per perfusion records   BLOOD ADMINISTERED:none  DRAINS: Mediastinal and left pleural drains   LOCAL MEDICATIONS USED:  NONE  SPECIMEN:  Source of Specimen:  Aortic valve leaflets  DISPOSITION OF SPECIMEN:  PATHOLOGY  COUNTS:  YES  DICTATION: .Dragon Dictation  PLAN OF CARE: Admit to inpatient   PATIENT DISPOSITION:  ICU - intubated and hemodynamically stable.   Delay start of Pharmacological VTE agent (>24hrs) due to surgical blood loss or risk of bleeding: yes

## 2019-09-07 NOTE — Transfer of Care (Signed)
Immediate Anesthesia Transfer of Care Note  Patient: Natasha Taylor  Procedure(s) Performed: MINIMALLY INVASIVE AORTIC VALVE REPLACEMENT (AVR) using [employer] INSPIRIS Resilia 21 MM Aortic Valve. (N/A Chest) TRANSESOPHAGEAL ECHOCARDIOGRAM (TEE) (N/A )  Patient Location: ICU  Anesthesia Type:General  Level of Consciousness: awake and patient cooperative  Airway & Oxygen Therapy: Patient Spontanous Breathing and Patient connected to face mask oxygen  Post-op Assessment: Report given to RN and Post -op Vital signs reviewed and stable  Post vital signs: Reviewed and stable  Last Vitals:  Vitals Value Taken Time  BP    Temp    Pulse    Resp 19 09/07/19 1429  SpO2    Vitals shown include unvalidated device data.  Last Pain:  Vitals:   09/07/19 0620  TempSrc: Oral  PainSc:          Complications: No complications documented.

## 2019-09-07 NOTE — Interval H&P Note (Signed)
History and Physical Interval Note:  09/07/2019 5:45 AM  Natasha Taylor  has presented today for surgery, with the diagnosis of AS.  The various methods of treatment have been discussed with the patient and family. After consideration of risks, benefits and other options for treatment, the patient has consented to  Procedure(s): MINIMALLY INVASIVE AORTIC VALVE REPLACEMENT (AVR) (N/A) TRANSESOPHAGEAL ECHOCARDIOGRAM (TEE) (N/A) as a surgical intervention.  The patient's history has been reviewed, patient examined, no change in status, stable for surgery.  I have reviewed the patient's chart and labs.  Questions were answered to the patient's satisfaction.     Purcell Nails

## 2019-09-07 NOTE — Op Note (Signed)
CARDIOTHORACIC SURGERY OPERATIVE NOTE  Date of Procedure:  09/07/2019  Preoperative Diagnosis: Severe Aortic Stenosis   Postoperative Diagnosis: Same   Procedure:    Minimally Invasive Aortic Valve Replacement Right Anterior Mini-thoracotomy Edwards Inspiris Resilia Stented Bovine Pericardial Tissue Valve (size 21 mm, catalog #11500A, serial #[id number])   Surgeon: Salvatore Decent. Cornelius Moras, MD  Assistant: Jillyn Hidden, PA-C  Anesthesia: Heather Roberts, MD  Operative Findings:  Severe calcific aortic stenosis  Normal LV systolic funcion  Mild-moderate LV hypertrophy             BRIEF CLINICAL NOTE AND INDICATIONS FOR SURGERY  Patient is a 59 year old obese female with bicuspid aortic valve, hypertension, hyperlipidemia, GE reflux disease, scleroderma, rheumatoid arthritis, and obstructive sleep apnea on CPAP who has been referred for surgical consultation to discuss treatment options for management of severe symptomatic aortic stenosis.  Patient states that she has a long history of chronic shortness of breath. She was first noted to have a heart murmur approximately 4 or5 years ago. Echocardiogram revealed possible bicuspid aortic valve with moderate aortic stenosis. She has been followed carefully ever since by Dr. Jens Som. Patient has developed progressive symptoms of exertional shortness of breath and most recent transthoracic echocardiogram performed January 05, 2019 revealed preserved left ventricular systolic function with progression and severity of aortic valve disease with peak velocity across aortic valve measured greater than 4.1 m/s corresponding to mean transvalvular gradient estimated 40 mmHg. The patient underwent diagnostic cardiac catheterization Jul 26, 2019 which confirmed the presence of aortic stenosis and revealed nonobstructive coronary artery disease with stable mild proximal stenosis of the left anterior descending coronary artery, unchanged from  catheterization performed in 2017. Cardiothoracic surgical consultation was requested.  The patient has been seen in consultation and counseled at length regarding the indications, risks and potential benefits of surgery.  All questions have been answered, and the patient provides full informed consent for the operation as described.    DETAILS OF THE OPERATIVE PROCEDURE  Preparation:  The patient is brought to the operating room on the above mentioned date and central monitoring was established by the anesthesia team including placement of Swan-Ganz catheter through the left internal jugular vein.  A radial arterial line is placed. The patient is placed in the supine position on the operating table.  Intravenous antibiotics are administered. General endotracheal anesthesia is induced uneventfully. The patient is initially intubated using a dual lumen endotracheal tube.  A Foley catheter is placed.  Baseline transesophageal echocardiogram was performed.  Findings were notable for severe calcific aortic stenosis.  There was trivial aortic insufficiency.  There was normal left ventricular systolic function.  There was mild to moderate left ventricular hypertrophy.  No other abnormalities were noted.  The patient is placed in the supine position with their neck gently extended and turned to the left.   The patient's right neck, chest, abdomen, both groins, and both lower extremities are prepared and draped in a sterile manner. A time out procedure is performed.   Percutaneous Vascular Access:  Percutaneous arterial and venous access were obtained on the right side.  Using ultrasound guidance the right common femoral vein was cannulated using the Seldinger technique and a pair of Perclose vascular closure devises were placed at opposing 30 degree angles, after which time an 8 French sheath inserted.  The right common femoral artery was cannulated using a micropuncture wire and sheath.  A pair of  Perclose vascular closure devices were placed at opposing 30 degree angles in the  femoral artery, and a 8 French sheath inserted.  The right internal jugular vein was cannulated  using ultrasound guidance and an 8 French sheath inserted.     Surgical Approach:  A right miniature anteriorthoracotomy incision is performed. The incision is placed immediately over the 3rd intercostal space.  The muscle fibers of the pectoralis major muscle are split longitudinally and the right pleural space is entered through the 3rd intercostal space. The 2nd costal cartilage is divided at it's insertion onto the sternum.  The right internal mammary artery and veins are ligated and divided.  A soft tissue retractor is placed.  Two 11 mm ports are placed through separate stab incisions inferiorly. The right pleural space is insufflated continuously with carbon dioxide gas through the posterior port during the remainder of the operation.     Extracorporeal Cardiopulmonary Bypass and Myocardial Protection:  The patient was heparinized systemically.  The right common femoral vein is cannulated through the venous sheath and a guidewire advanced into the right atrium using TEE guidance.  The femoral vein cannulated using a 22 Fr long femoral venous cannula.  The right common femoral artery is cannulated through the arterial sheath and a guidewire advanced into the descending thoracic aorta using TEE guidance.  Femoral artery is cannulated with a 18 French femoral arterial cannula.  The right internal jugular vein is cannulated through the venous sheath and a guidewire advanced into the right atrium.  The internal jugular vein is cannulated using a 14 French pediatric femoral venous cannula.   Adequate heparinization is verified.   The entire pre-bypass portion of the operation was notable for stable hemodynamics.  Cardiopulmonary bypass was begun.  Vacuum assist venous drainage is utilized. An incision is made in the  pericardium over the ascending aorta and extended in both directions. Silk traction sutures are placed to retract the pericardium.  Venous drainage and exposure are notably excellent. A left ventricular vent is placed through the right superior pulmonary vein.  An antegrade cardioplegia cannula is placed in the ascending aorta.    The patient is cooled to 32C systemic temperature.  The aortic cross clamp is applied and cardioplegia is delivered initially in an antegrade fashion through the aortic root using modified [place] cold blood cardioplegia ([place] blood cardioplegia protocol).   The initial cardioplegic arrest is rapid with early diastolic arrest.  Myocardial protection was felt to be excellent.   Aortic Valve Replacement:  An oblique transverse aortotomy incision was performed.  The aortic valve was inspected and notable for trileaflet aortic valve with severe calcific aortic stenosis.  The aortic valve leaflets were excised sharply and the aortic annulus decalcified.  Decalcification was notably straightforward.  The aortic annulus was sized to accept a 21 mm prosthesis.  The aortic root and left ventricle were irrigated with copious cold saline solution.  Aortic valve replacement was performed using interrupted horizontal mattress 2-0 Ethibond pledgeted sutures with pledgets in the subannular position.  An Edwards Inspiris Resilia stented bovine pericardial tissue valve (size 21 mm, model # 11500A, serial # [id number]) was implanted uneventfully. The valve seated appropriately with adequate space beneath the left main and right coronary artery.  All sutures were secured using a Cor-knot device.   Procedure Completion:  The aortotomy was closed using a 2-layer closure of running 4-0 Prolene suture.  All air was evacuated through the aortic root.  The aortic cross clamp was removed after a total cross clamp time of 78 minutes.  Epicardial pacing wires are  fixed to the right ventricular  outflow tract and to the right atrial appendage. The patient is rewarmed to 37C temperature.  The pericardial sac was drained using a 32 French Bard drain placed through the anterior port incision.   The patient is weaned and disconnected from cardiopulmonary bypass.  The patient's rhythm at separation from bypass was AV paced.  The patient was weaned from bypass without any inotropic support. Total cardiopulmonary bypass time for the operation was 134 minutes.  Followup transesophageal echocardiogram performed after separation from bypass revealed a well-seated bioprosthetic tissue valve in the aortic position with no paravalvular leak.  Left ventricular function was unchanged from preoperatively.    The femoral arterial and venous cannulae were removed uneventfully. There was a palpable pulse in the distal right common femoral artery after removal of the cannula.   Protamine was administered to reverse the anticoagulation.   The right femoral arterial line and venous cannulas were both removed and Perclose sutures secured.  The right internal jugular venous cannula is removed and manual pressure held for 30 minutes.  Single lung ventilation was begun. The aortotomy closure was inspected for hemostasis. The right pleural space is irrigated with saline solution and inspected for hemostasis. The right pleural space was drained using a 32 French Bard drain placed through the posterior port incision. The 2nd costal cartilage is replaced onto the sternum using FiberTape cerclage.  The miniature thoracotomy incision was closed in multiple layers in routine fashion. The right groin incision was inspected for hemostasis and closed in multiple layers in routine fashion.   The post-bypass portion of the operation was notable for stable rhythm and hemodynamics.   No blood products were administered during the operation.   Disposition:  The patient tolerated the procedure well.  The patient was reintubated using  a single lumen endotracheal tube and subsequently transported to the surgical intensive care unit in stable condition. There were no intraoperative complications. All sponge instrument and needle counts are verified correct at completion of the operation.     Valentina Gu. Roxy Manns MD 09/07/2019 1:49 PM

## 2019-09-07 NOTE — Anesthesia Procedure Notes (Signed)
Central Venous Catheter Insertion Performed by: Heather Roberts, MD, anesthesiologist Start/End6/17/2021 6:52 AM, 09/07/2019 7:02 AM Patient location: Pre-op. Preanesthetic checklist: patient identified, IV checked, site marked, risks and benefits discussed, surgical consent, monitors and equipment checked, pre-op evaluation, timeout performed and anesthesia consent Position: Trendelenburg Lidocaine 1% used for infiltration and patient sedated Hand hygiene performed , maximum sterile barriers used  and Seldinger technique used Catheter size: 9 Fr Total catheter length 8. PA cath was placed.Sheath introducer Swan type:thermodilution PA Cath depth:50 Procedure performed using ultrasound guided technique. Ultrasound Notes:anatomy identified, needle tip was noted to be adjacent to the nerve/plexus identified, no ultrasound evidence of intravascular and/or intraneural injection and image(s) printed for medical record Attempts: 1 Following insertion, line sutured, dressing applied and Biopatch. Post procedure assessment: free fluid flow, blood return through all ports and no air  Patient tolerated the procedure well with no immediate complications.

## 2019-09-07 NOTE — Progress Notes (Signed)
Pt sates she doesn't want to wear CPAP tonight.

## 2019-09-07 NOTE — Anesthesia Procedure Notes (Addendum)
Procedure Name: Intubation Date/Time: 09/07/2019 7:51 AM Performed by: Orlie Dakin, CRNA Pre-anesthesia Checklist: Patient identified, Emergency Drugs available, Suction available and Patient being monitored Patient Re-evaluated:Patient Re-evaluated prior to induction Oxygen Delivery Method: Circle system utilized Preoxygenation: Pre-oxygenation with 100% oxygen Induction Type: IV induction Ventilation: Mask ventilation without difficulty and Oral airway inserted - appropriate to patient size Laryngoscope Size: Mac and 3 Grade View: Grade I Tube type: Oral Endobronchial tube: EBT position confirmed by fiberoptic bronchoscope, Left and Double lumen EBT and 35 Fr Number of attempts: 1 Airway Equipment and Method: Stylet and Oral airway Placement Confirmation: ETT inserted through vocal cords under direct vision,  positive ETCO2 and breath sounds checked- equal and bilateral Secured at: 26 cm Tube secured with: Tape Dental Injury: Teeth and Oropharynx as per pre-operative assessment  Comments: Viva-site EBT with camera used.  [place] intubated

## 2019-09-07 NOTE — Anesthesia Procedure Notes (Signed)
Arterial Line Insertion Start/End6/17/2021 7:02 AM, 09/07/2019 7:12 AM Performed by: Heather Roberts, MD, anesthesiologist  Patient location: Pre-op. Preanesthetic checklist: patient identified, IV checked, site marked, risks and benefits discussed, surgical consent, monitors and equipment checked, pre-op evaluation, timeout performed and anesthesia consent Lidocaine 1% used for infiltration Right, brachial was placed Catheter size: 20 G Hand hygiene performed  and maximum sterile barriers used   Attempts: 1 Procedure performed using ultrasound guided technique. Ultrasound Notes:image(s) printed for medical record Following insertion, dressing applied and Biopatch. Post procedure assessment: normal and unchanged

## 2019-09-07 NOTE — Progress Notes (Signed)
  Echocardiogram Echocardiogram Transesophageal has been performed.  Gerda Diss 09/07/2019, 8:48 AM

## 2019-09-07 NOTE — Progress Notes (Signed)
[facility] BRIEF SICU PROGRESS NOTE  Day of Surgery  S/P Procedure(s) (LRB): MINIMALLY INVASIVE AORTIC VALVE REPLACEMENT (AVR) using [employer] INSPIRIS Resilia 21 MM Aortic Valve. (N/A) TRANSESOPHAGEAL ECHOCARDIOGRAM (TEE) (N/A)   Feels well.  Mild discomfort right shoulder, otherwise no pain NSR w/ stable hemodynamics on very low dose Neo drip for BP Breathing comfortably w/ O2 sats 93% on 4 L/min Chest tube output trending down <100 mL/hr UOP excellent Labs okay w/ INR 1.6 aPTT 42  Plan: Continue routine early postop  Purcell Nails, MD 09/07/2019 6:47 PM

## 2019-09-07 NOTE — H&P (Signed)
[address]       [name] [address]             [phone]          CARDIOTHORACIC SURGERY HISTORY AND PHYSICAL EXAM  Referring Provider is Jens Som Madolyn Frieze, MD PCP is Drosinis, [place] Reader, PA-C  Chief Complaint  Patient presents with   Aortic Stenosis    Surgical eval, Cardic Cath 07/26/19, ECHO 01/05/19    HPI:  Patient is a 59 year old obese female with bicuspid aortic valve, hypertension, hyperlipidemia, GE reflux disease, scleroderma, rheumatoid arthritis, and obstructive sleep apnea on CPAP who has been referred for surgical consultation to discuss treatment options for management of severe symptomatic aortic stenosis.  Patient states that she has a long history of chronic shortness of breath.  She was first noted to have a heart murmur approximately 4 or 5 years ago.  Echocardiogram revealed possible bicuspid aortic valve with moderate aortic stenosis.  She has been followed carefully ever since by Dr. Jens Som.  Patient has developed progressive symptoms of exertional shortness of breath and most recent transthoracic echocardiogram performed January 05, 2019 revealed preserved left ventricular systolic function with progression and severity of aortic valve disease with peak velocity across aortic valve measured greater than 4.1 m/s corresponding to mean transvalvular gradient estimated 40 mmHg.  The patient underwent diagnostic cardiac catheterization Jul 26, 2019 which confirmed the presence of aortic stenosis and revealed nonobstructive coronary artery disease with stable mild proximal stenosis of the left anterior descending coronary artery, unchanged from catheterization performed in 2017.  Cardiothoracic surgical consultation was requested.  Patient is married and lives locally in [place] with her husband.  She has been disabled for more than 20 years having previously worked for [employer].  She originally went out on disability when she  first developed symptoms related to scleroderma.  She lives a sedentary lifestyle.  She is chronically limited by exertional shortness of breath which reportedly has gotten worse recently.  She now gets short of breath with low-level activity and when laying flat in bed.  She reports occasional brief episodes of tightness across her chest.  She has occasional palpitations and lower extremity edema.  She has had mild dizziness without syncope.  She has stable chronic symptoms of dysphagia related to scleroderma.  Occasionally food gets stuck because of esophageal dysmotility but she has not had significant stricture formation to her knowledge.  She has chronic dry cough.  She uses CPAP for sleep apnea at night.  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  She is not very active physically.  She uses a cane for long distance walking.  She has recently developed some mild blurriness of her vision in both eyes since she started taking oral Plaquenil for her rheumatoid arthritis.  Patient was originally seen in consultation on Aug 07, 2019 at which time we made tentative plans for elective aortic valve replacement later this week.  She returns to the office today and reports no new problems or complaints.  She describes stable symptoms of exertional shortness of breath and fatigue consistent with chronic diastolic congestive heart failure, New York Heart Association functional class III.  Past Medical History:  Diagnosis Date   Anxiety    Aortic stenosis    CHF (congestive heart failure) (HCC)    COPD (chronic obstructive pulmonary disease) (HCC)    Depression    Dyspnea  on exertion and lying down   GERD (gastroesophageal reflux disease)    H/O blood clots    Headache    migraines   Heart murmur    History of hiatal hernia    Hyperlipidemia    Hypertension    Hypothyroidism    Insomnia    Painful orthopaedic hardware  (HCC)    Raynaud disease    Rheumatoid arthritis (HCC)    Scleroderma (HCC)    Sleep apnea    wears cpap   Wears glasses    Wears partial dentures     Past Surgical History:  Procedure Laterality Date   ABDOMINAL HYSTERECTOMY     TAH   BUNIONECTOMY     CARDIAC CATHETERIZATION N/A 04/24/2015   Procedure: Right/Left Heart Cath and Coronary Angiography;  Surgeon: Tonny Bollman, MD;  Location: [facility] INVASIVE CV LAB;  Service: Cardiovascular;  Laterality: N/A;   CHOLECYSTECTOMY     COLONOSCOPY WITH ESOPHAGOGASTRODUODENOSCOPY (EGD)     HARDWARE REMOVAL Right 01/20/2018   Procedure: Removal of deep implants right medial cuneiform, 1st metatarsal and 2nd metatarsal;  Surgeon: Toni Arthurs, MD;  Location: [facility] OR;  Service: Orthopedics;  Laterality: Right;    MULTIPLE TOOTH EXTRACTIONS     RIGHT/LEFT HEART CATH AND CORONARY ANGIOGRAPHY N/A 07/26/2019   Procedure: RIGHT/LEFT HEART CATH AND CORONARY ANGIOGRAPHY;  Surgeon: Tonny Bollman, MD;  Location[facility] INVASIVE CV LAB;  Service: Cardiovascular;  Laterality: N/A;   sympathomyectomies  2000   Bilateral wrists   THROMBECTOMY Left 1999   wrist    Family History  Adopted: Yes  Problem Relation Age of Onset   Heart attack Sister 67       deceased    Social History Social History   Tobacco Use   Smoking status: Never Smoker   Smokeless tobacco: Never Used  Building services engineer Use: Never used  Substance Use Topics   Alcohol use: No    Alcohol/week: 0.0 standard drinks   Drug use: No    Prior to Admission medications   Medication Sig Start Date End Date Taking? Authorizing Provider  amLODipine (NORVASC) 10 MG tablet TAKE 1 TABLET(10 MG) BY MOUTH DAILY Patient taking differently: Take 10 mg by mouth daily.  11/29/18  Yes Lewayne Bunting, MD  aspirin EC 81 MG tablet Take 81 mg by mouth daily.   Yes [provider]  atorvastatin (LIPITOR) 40 MG tablet Take 40 mg by mouth daily.    Yes [provider]  carboxymethylcellulose (REFRESH PLUS) 0.5 % SOLN Place 1 drop into both eyes daily.    Yes [provider]  cholecalciferol (VITAMIN D) 25 MCG (1000 UNIT) tablet Take 1,000 Units by mouth 2 (two) times daily.    Yes [provider]  diclofenac (CATAFLAM) 50 MG tablet Take 50 mg by mouth 2 (two) times daily as needed (migraine).  06/30/19  Yes [provider]  divalproex (DEPAKOTE ER) 500 MG 24 hr tablet Take 500 mg by mouth daily. 05/23/19  Yes [provider]  lansoprazole (PREVACID) 30 MG capsule Take 30 mg by mouth 2 (two) times daily.    Yes [provider]  levothyroxine (SYNTHROID) 75 MCG tablet Take 75 mcg by mouth daily before breakfast.    Yes [provider]  Melatonin 5 MG CAPS Take 5 mg by mouth at bedtime as needed (for sleep).   Yes [provider]  metoprolol succinate (TOPROL-XL) 50 MG 24 hr tablet Take 50 mg by  mouth daily.  01/02/15  Yes [provider]  Multiple Vitamin (MULTIVITAMIN WITH MINERALS) TABS tablet Take 1 tablet by mouth daily. 01/24/15  Yes [provider]  NURTEC 75 MG TBDP Take 1 tablet by mouth daily as needed for headache. 07/16/19  Yes [provider]  tretinoin (RETIN-A) 0.05 % cream Apply 1 application topically daily as needed (Outbreak).    Yes [provider]  vitamin B-12 (CYANOCOBALAMIN) 1000 MCG tablet Take 1,000 mcg by mouth daily.    Yes [provider]  doxycycline (VIBRAMYCIN) 100 MG capsule Take 100 mg by mouth as needed (acne break outs).  09/04/15   [provider]  Omega-3 Fatty Acids (EQL OMEGA 3 FISH OIL) 1000 MG CAPS Take 1,000 mg by mouth 2 (two) times daily.  Patient not taking: Reported on 09/04/2019    [provider]    Allergies  Allergen Reactions   Morphine And Related Hives and Itching   Gabapentin Itching and Other (See Comments)    Brain fog.   Meperidine Itching     Review of  Systems:              General:                      normal appetite, decreased energy, no weight gain, no weight loss, no fever             Cardiac:                       no chest pain with exertion, occasional brief episodes chest pain at rest, +SOB with exertion, no resting SOB, no PND, + orthopnea, + palpitations, no arrhythmia, no atrial fibrillation, + LE edema, + dizzy spells, no syncope             Respiratory:                 + shortness of breath, no home oxygen, no productive cough, + intermittent dry cough, no bronchitis, no wheezing, no hemoptysis, no asthma, no pain with inspiration or cough, + sleep apnea, + CPAP at night             GI:                               + difficulty swallowing, + reflux, + frequent heartburn, + hiatal hernia, no abdominal pain, + constipation, + diarrhea, no hematochezia, no hematemesis, no melena             GU:                              no dysuria,  no frequency, no urinary tract infection, no hematuria, no kidney stones, no kidney disease             Vascular:                     no pain suggestive of claudication, no pain in feet, no leg cramps, no varicose veins, no DVT, no non-healing foot ulcer             Neuro:                         no stroke, no TIA's, no seizures, no headaches, no temporary blindness one  eye,  no slurred speech, no peripheral neuropathy, + chronic pain, + instability of gait, no memory/cognitive dysfunction             Musculoskeletal:         + arthritis, + joint swelling, no myalgias, + difficulty walking, decreased mobility              Skin:                            no rash, no itching, no skin infections, no pressure sores or ulcerations             Psych:                         + anxiety, no depression, no nervousness, no unusual recent stress             Eyes:                           + blurry vision, no floaters, no recent vision changes, + wears glasses or contacts             ENT:                            no  hearing loss, no loose or painful teeth, partial upper dentures, last saw dentist 2019             Hematologic:               no easy bruising, no abnormal bleeding, no clotting disorder, no frequent epistaxis             Endocrine:                   no diabetes, does not check CBG's at home                                                       Physical Exam:              BP 140/90    Pulse 88    Temp 97.6 F (36.4 C) (Skin)    Resp 20    Ht  (1.6 m)    Wt 185 lb (83.9 kg)    SpO2 95% Comment: RA   BMI 32.77 kg/m              General:                      Moderately obese, appears older than stated age               HEENT:                       Unremarkable              Neck:                           no JVD, no bruits, no adenopathy              Chest:  clear to auscultation, symmetrical breath sounds, no wheezes, no rhonchi              CV:                              RRR, grade III/VI crescendo/decrescendo murmur heard best at RSB,  no diastolic murmur             Abdomen:                    soft, non-tender, no masses              Extremities:                 warm, well-perfused, pulses diminished, no LE edema             Rectal/GU                   Deferred             Neuro:                         Grossly non-focal and symmetrical throughout             Skin:                            Clean and dry, no rashes, no breakdown   Diagnostic Tests:  ECHOCARDIOGRAM REPORT       Patient Name:  NATAHSA MARIAN Date of Exam: 01/05/2019  Medical Rec #: [id number]    Height:    63.0 in  Accession #:  [id number]    Weight:    190.0 lb  Date of Birth: 12-11-1960     BSA:     1.89 m  Patient Age:  58 years     BP:      135/104 mmHg  Patient Gender: F        HR:      70 bpm.  Exam Location: [address]   Procedure: 2D Echo, Cardiac Doppler and Color Doppler   Indications:  I35.0 Aortic  Stenosis    History:    Patient has prior history of Echocardiogram examinations,  most         recent 01/03/2018. CAD Signs/Symptoms:Murmur and         Dizziness/Lightheadedness Risk Factors:Sleep Apnea and         Hypertension.    Sonographer:  Clearence Ped RCS  Referring Phys: 37 BRIAN S CRENSHAW   IMPRESSIONS    1. Left ventricular ejection fraction, by visual estimation, is 55 to  60%. The left ventricle has normal function. Left ventricular septal wall  thickness was moderately increased. There is moderately increased left  ventricular hypertrophy.  2. Global right ventricle has normal systolic function.The right  ventricular size is normal. No increase in right ventricular wall  thickness.  3. Left atrial size was mildly dilated.  4. Right atrial size was normal.  5. The mitral valve is normal in structure. Trace mitral valve  regurgitation.  6. The tricuspid valve is normal in structure. Tricuspid valve  regurgitation is mild.  7. The aortic valve has an indeterminant number of cusps Aortic valve  regurgitation is mild by color flow Doppler. Severe aortic valve stenosis.  8. There is Moderate thickening of the aortic valve.  9. There  is Severe calcifcation of the aortic valve.  10. AV calcified with restricted motion Inderterminate number of leaflets  Previous echo done 01/03/18 suggested moderate AS with mean gradient 22  peak 44 mmHg Suprasternal CW signal on this study suggests severe AS with  mean gradient 40 mmHg and peak 70  mmhg.  11. The pulmonic valve was grossly normal. Pulmonic valve regurgitation is  mild by color flow Doppler.   FINDINGS  Left Ventricle: Left ventricular ejection fraction, by visual estimation,  is 55 to 60%. The left ventricle has normal function. Left ventricular  septal wall thickness was moderately increased. There is moderately  increased left ventricular hypertrophy.   Right  Ventricle: The right ventricular size is normal. No increase in  right ventricular wall thickness. Global RV systolic function is has  normal systolic function.   Left Atrium: Left atrial size was mildly dilated.   Right Atrium: Right atrial size was normal in size   Pericardium: There is no evidence of pericardial effusion.   Mitral Valve: The mitral valve is normal in structure. There is mild  thickening of the mitral valve leaflet(s). Trace mitral valve  regurgitation.   Tricuspid Valve: The tricuspid valve is normal in structure. Tricuspid  valve regurgitation is mild by color flow Doppler.   Aortic Valve: The aortic valve has an indeterminant number of cusps. There  is Moderate thickening and Severe calcifcation of the aortic valve. Aortic  valve regurgitation is mild by color flow Doppler. Aortic regurgitation  PHT measures 494 msec. Severe  aortic stenosis is present. There is Moderate thickening of the aortic  valve. Severe calcifcation. Aortic valve mean gradient measures 40.0 mmHg.  Aortic valve peak gradient measures 69.6 mmHg. Aortic valve area, by VTI  measures 0.70 cm. AV calcified  with restricted motion Inderterminate number of leaflets Previous echo  done 01/03/18 suggested moderate AS with mean gradient 22 peak 44 mmHg  Suprasternal CW signal on this study suggests severe AS with mean gradient  40 mmHg and peak 70 mmhg.   Pulmonic Valve: The pulmonic valve was grossly normal. Pulmonic valve  regurgitation is mild by color flow Doppler.   Aorta: The aortic root is normal in size and structure.   IAS/Shunts: No atrial level shunt detected by color flow Doppler.      LEFT VENTRICLE  PLAX 2D  LVIDd:     3.57 cm Diastology  LVIDs:     2.37 cm LV e' lateral:  5.33 cm/s  LV PW:     1.48 cm LV E/e' lateral: 12.1  LV IVS:    1.60 cm LV e' medial:  4.68 cm/s  LVOT diam:   2.00 cm LV E/e' medial: 13.8  LV SV:     34 ml  LV SV  Index:  16.96  LVOT Area:   3.14 cm     RIGHT VENTRICLE  RV Basal diam: 3.39 cm  RV S prime:   13.80 cm/s  TAPSE (M-mode): 2.4 cm   LEFT ATRIUM       Index    RIGHT ATRIUM      Index  LA diam:    3.90 cm 2.06 cm/m RA Area:   12.30 cm  LA Vol (A2C):  42.5 ml 22.46 ml/m RA Volume:  24.70 ml 13.06 ml/m  LA Vol (A4C):  39.9 ml 21.09 ml/m  LA Biplane Vol: 43.1 ml 22.78 ml/m  AORTIC VALVE  AV Area (Vmax):  0.72 cm  AV Area (Vmean):  0.75 cm  AV Area (VTI):   0.70 cm  AV Vmax:      417.00 cm/s  AV Vmean:     293.000 cm/s  AV VTI:      1.000 m  AV Peak Grad:   69.6 mmHg  AV Mean Grad:   40.0 mmHg  LVOT Vmax:     95.30 cm/s  LVOT Vmean:    70.400 cm/s  LVOT VTI:     0.222 m  LVOT/AV VTI ratio: 0.22  AI PHT:      494 msec    AORTA  Ao Root diam: 2.80 cm   MITRAL VALVE  MV Area (PHT): 3.08 cm       SHUNTS  MV PHT:    71.34 msec      Systemic VTI: 0.22 m  MV Decel Time: 246 msec       Systemic Diam: 2.00 cm  MV E velocity: 64.50 cm/s 103 cm/s  MV A velocity: 84.40 cm/s 70.3 cm/s  MV E/A ratio: 0.76    1.5     Charlton Haws MD  Electronically signed by Charlton Haws MD  Signature Date/Time: 01/05/2019/4:20:27 PM      EKG:   NSR w/out significant AV conduction delay     RIGHT/LEFT HEART CATH AND CORONARY ANGIOGRAPHY  Conclusion  1. Mild nonobstructive coronary artery disease primarily affecting the proximal LAD, unchanged from the previous cardiac catheterization study in 2017 2. Severe aortic stenosis with a mean gradient of 32 mmHg 3. Mildly elevated pulmonary artery pressure, likely secondary to left heart disease  Recommendation: Cardiac surgical evaluation for consideration of aortic valve replacement.  Indications  Severe aortic stenosis [I35.0 (ICD-10-CM)]  Procedural Details  Technical Details INDICATION: Severe symptomatic aortic  stenosis (Stage D1 disease).  PROCEDURAL DETAILS: There was an indwelling IV in a right antecubital vein. Using normal sterile technique, the IV was changed out for a 5 Fr brachial sheath over a 0.018 inch wire. The right wrist was then prepped, draped, and anesthetized with 1% lidocaine. Using ultrasound guidance, a 5/6 French Slender sheath was placed in the right radial artery. Ultrasound images are captured and stored in the patient's chart. Intra-arterial verapamil was administered through the radial artery sheath. IV heparin was administered after a JR4 catheter was advanced into the central aorta. A Swan-Ganz catheter was used for the right heart catheterization. Standard protocol was followed for recording of right heart pressures and sampling of oxygen saturations. Fick cardiac output was calculated. Standard Judkins catheters were used for selective coronary angiography. 4 French catheters were used because of resistance with 5 French catheters and a small caliber radial artery. LV pressure is recorded and an aortic valve pullback is performed. There were no immediate procedural complications. The patient was transferred to the post catheterization recovery area for further monitoring.    Estimated blood loss <50 mL.   During this procedure medications were administered to achieve and maintain moderate conscious sedation while the patient's heart rate, blood pressure, and oxygen saturation were continuously monitored and I was present face-to-face 100% of this time.  Medications (Filter: Administrations occurring from 07/26/19 1112 to 07/26/19 1215) (important)  Continuous medications are totaled by the amount administered until 07/26/19 1215.  fentaNYL (SUBLIMAZE) injection (mcg) Total dose:  50 mcg Date/Time  Rate/Dose/Volume Action  07/26/19 1128  25 mcg Given  1138  25 mcg Given    midazolam (VERSED) injection (mg) Total dose:  2 mg Date/Time  Rate/Dose/Volume Action   07/26/19 1128  1 mg  Given  1138  1 mg Given    Heparin (Porcine) in NaCl 1000-0.9 UT/500ML-% SOLN (mL) Total volume:  1,000 mL Date/Time  Rate/Dose/Volume Action  07/26/19 1129  500 mL Given  1129  500 mL Given    lidocaine (PF) (XYLOCAINE) 1 % injection (mL) Total volume:  4 mL Date/Time  Rate/Dose/Volume Action  07/26/19 1137  2 mL Given  1140  2 mL Given    Radial Cocktail/Verapamil only (mL) Total volume:  10 mL Date/Time  Rate/Dose/Volume Action  07/26/19 1142  10 mL Given    heparin sodium (porcine) injection (Units) Total dose:  4,000 Units Date/Time  Rate/Dose/Volume Action  07/26/19 1154  4,000 Units Given    iohexol (OMNIPAQUE) 350 MG/ML injection (mL) Total volume:  55 mL Date/Time  Rate/Dose/Volume Action  07/26/19 1205  55 mL Given    Sedation Time  Sedation Time Physician-1: 36 minutes 2 seconds  Contrast  Medication Name Total Dose  iohexol (OMNIPAQUE) 350 MG/ML injection 55 mL    Radiation/Fluoro  Fluoro time: 5.9 (min) DAP: 11883 (mGycm2) Cumulative Air Kerma: 210 (mGy)  Coronary Findings  Diagnostic Dominance: Right Left Main  Vessel is angiographically normal. The left main is patent with no obstruction. It divides into the LAD and left circumflex.  Left Anterior Descending  There is mild diffuse disease throughout the vessel. The LAD covers a large territory. The vessel wraps around the LV apex. There is mild nonobstructive disease in the proximal LAD but no other significant stenosis.  Prox LAD lesion 25% stenosed  Prox LAD lesion is 25% stenosed. There is a mild 25% shelflike plaque in the proximal LAD. This is unchanged from the previous cardiac catheterization study in 2017. The LAD is patent throughout its course with no evidence of high-grade stenosis.  Left Circumflex  The vessel exhibits minimal luminal irregularities. Angiographically normal vessel with no significant obstruction.  Right Coronary Artery  The  vessel exhibits minimal luminal irregularities. Dominant RCA, medium in caliber, no significant obstruction.  Intervention  No interventions have been documented. Coronary Diagrams  Diagnostic Dominance: Right  Intervention  Implants   No implant documentation for this case.  Syngo Images  Show images for CARDIAC CATHETERIZATION  Images on Long Term Storage  Show images for Ersilia, Brawley to Procedure Log  Procedure Log    Hemo Data   Most Recent Value  Fick Cardiac Output 6.57 L/min  Fick Cardiac Output Index 3.48 (L/min)/BSA  Aortic Mean Gradient 31.73 mmHg  Aortic Peak Gradient 30 mmHg  Aortic Valve Area 1.19  Aortic Value Area Index 0.63 cm2/BSA  RA A Wave 11 mmHg  RA V Wave 9 mmHg  RA Mean 7 mmHg  RV Systolic Pressure 42 mmHg  RV Diastolic Pressure 5 mmHg  RV EDP 12 mmHg  PA Systolic Pressure 42 mmHg  PA Diastolic Pressure 19 mmHg  PA Mean 29 mmHg  PW A Wave 22 mmHg  PW V Wave 24 mmHg  PW Mean 18 mmHg  AO Systolic Pressure 138 mmHg  AO Diastolic Pressure 74 mmHg  AO Mean 99 mmHg  LV Systolic Pressure 170 mmHg  LV Diastolic Pressure 10 mmHg  LV EDP 19 mmHg  AOp Systolic Pressure 139 mmHg  AOp Diastolic Pressure 73 mmHg  AOp Mean Pressure 99 mmHg  LVp Systolic Pressure 169 mmHg  LVp Diastolic Pressure 10 mmHg  LVp EDP Pressure 18 mmHg  QP/QS 1  TPVR Index 8.35 HRUI  TSVR Index 28.49 HRUI  PVR SVR Ratio 0.12  TPVR/TSVR Ratio 0.29    CT ANGIOGRAPHY CHEST WITH CONTRAST  TECHNIQUE: Multidetector CT imaging of the chest was performed using the standard protocol during bolus administration of intravenous contrast. Multiplanar CT image reconstructions and MIPs were obtained to evaluate the vascular anatomy.  CONTRAST: ISOVUE-370 IOPAMIDOL (ISOVUE-370) INJECTION 76%  COMPARISON: Prior CT of the chest without contrast on 12/03/2011 at [facility]  FINDINGS: Cardiovascular: The thoracic aorta is well  opacified and shows normal caliber without evidence of aneurysmal disease. The aortic root measures 2.9 cm. The ascending thoracic aorta measures 3.2 cm. The proximal arch measures 2.8 cm. The distal arch measures 3.0 cm. The descending thoracic aorta measures 2.4 cm.  There are some calcifications associated with the aortic valve. The heart size appears normal. No significant calcified coronary artery plaque identified. No pericardial fluid is seen.  Mediastinum/Nodes: No enlarged mediastinal, hilar, or axillary lymph nodes. Thyroid gland, trachea, and esophagus demonstrate no significant findings.  Lungs/Pleura: Stable 3 mm noncalcified peripheral nodule in the left lower lobe. Stable 3 mm noncalcified right middle lobe nodule. There is no evidence of pulmonary edema, consolidation, pneumothorax or pleural fluid.  Upper Abdomen: 2.3 cm cyst is present in the upper pole of the spleen.  Musculoskeletal: No chest wall abnormality. No acute or significant osseous findings.  Review of the MIP images confirms the above findings.  IMPRESSION: 1. Normal caliber thoracic aorta without evidence of aneurysmal disease. 2. Some calcifications are seen associated with the aortic valve. 3. Stable 3 mm left lower lobe and right middle lobe pulmonary nodules since a prior study in 2013.   Electronically Signed By: Irish Lack M.D. On: 02/05/2017 16:04       Cardiac TAVR CT  TECHNIQUE: The patient was scanned on a [employer]. A 120 kV retrospective scan was triggered in the descending thoracic aorta at 111 HU's. Gantry rotation speed was 250 msecs and collimation was .6 mm. No beta blockade or nitro were given. The 3D data set was reconstructed in 5% intervals of the R-R cycle. Systolic and diastolic phases were analyzed on a dedicated work station using MPR, MIP and VRT modes. The patient received 80 cc of contrast.  FINDINGS: Image quality:  Poor timing of contrast bolus as it appears to be mainly in the SVC. HUs in the aorta are <250.  Noise artifact is: Mild.  Valve Morphology: The aortic valve appears to be bicuspid with fusion of the RCC/LCC with raphe Ileene Rubens type 1). There is severe bulky calcification of the NCC. Leaflet motion is severely restricted in systole. There is no raphe calcification.  Aortic Valve Calcium score: 792  Aortic annular dimension:  Phase assessed: 25%  Annular area: 395 mm2  Annular perimeter: 71.3 mm  Max diameter: 24.3 mm  Min diameter: 21.2 mm  Annular and subannular calcification: No significant annular calcification. Minimal LVOT calcification under the NCC/LCC.  Optimal coplanar projection: LAO 27 CAU 9  Coronary Artery Height above Annulus:  Left Main: 15.8 mm  Right Coronary: 14.7 mm  Sinus of Valsalva Measurements (Bicuspid):  Commissure to Commissure: 24 mm  Maximum Diameter: 29 mm  Sinus of Valsalva Height:  Non-coronary: 21.3 mm  Right-coronary: 21.9 mm  Left-coronary: 23.3 mm  Sinotubular Junction: 29 mm  Ascending Thoracic Aorta: 32 mm  Coronary Arteries: Normal coronary origin. Right dominance. The study was performed without use of NTG and is insufficient for plaque evaluation. Please refer to recent cardiac cath report for coronary  assessment.  Cardiac Morphology:  Right Atrium: Right atrial size is within normal limits.  Right Ventricle: The right ventricular cavity is within normal limits.  Left Atrium: Left atrial size is normal in size with no left atrial appendage filling defect.  Left Ventricle: The ventricular cavity size is within normal limits. There are no stigmata of prior infarction. There is no abnormal filling defect.  Pulmonary arteries: Normal in size without proximal filling defect.  Pulmonary veins: Normal pulmonary venous drainage.  Pericardium: Normal thickness with no significant  effusion or calcium present.  Mitral Valve: The mitral valve is normal structure without significant calcification.  Extra-cardiac findings: See attached radiology report for non-cardiac structures.  IMPRESSION: 1. Severely limited study due to poor timing of contrast bolus (bolus noted to be in SVC).  2. 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  3. Annular measurements (395 mm2) appropriate for 23 mm Edwards Sapien 3 TAVR.  4. Sufficient coronary to annulus distance.  5. Optimal Fluoroscopic Angle for Delivery: LAO 27 CAU 9  [place] T. Flora Lipps, MD   Electronically Signed By: Lennie Odor On: 08/16/2019 07:18    CT ANGIOGRAPHY CHEST, ABDOMEN AND PELVIS  TECHNIQUE: Non-contrast CT of the chest was initially obtained.  Multidetector CT imaging through the chest, abdomen and pelvis was performed using the standard protocol during bolus administration of intravenous contrast. Multiplanar reconstructed images and MIPs were obtained and reviewed to evaluate the vascular anatomy.  CONTRAST: OMNIPAQUE IOHEXOL 350 MG/ML SOLN  COMPARISON: Chest CTA 02/05/2017.  FINDINGS: CTA CHEST FINDINGS  Cardiovascular: Heart size is mildly enlarged with concentric left ventricular hypertrophy. There is no significant pericardial fluid, thickening or pericardial calcification. There is aortic atherosclerosis, as well as atherosclerosis of the great vessels of the mediastinum and the coronary arteries, including calcified atherosclerotic plaque in the left anterior descending coronary artery. Severe thickening calcification of the aortic valve.  Mediastinum/Lymph Nodes: No pathologically enlarged mediastinal or hilar lymph nodes. Esophagus is unremarkable in appearance. No axillary lymphadenopathy.  Lungs/Pleura: No suspicious appearing pulmonary nodules or masses are noted. No acute consolidative airspace  disease. No pleural effusions.  Musculoskeletal/Soft Tissues: There are no aggressive appearing lytic or blastic lesions noted in the visualized portions of the skeleton.  CTA ABDOMEN AND PELVIS FINDINGS  Hepatobiliary: No suspicious cystic or solid hepatic lesions. No intra or extrahepatic biliary ductal dilatation. Status post cholecystectomy.  Pancreas: No pancreatic mass. No pancreatic ductal dilatation. No pancreatic or peripancreatic fluid collections or inflammatory changes.  Spleen: 2.4 cm low-attenuation lesion in the superior aspect of the spleen, incompletely characterize, but likely a benign cyst.  Adrenals/Urinary Tract: Subcentimeter low-attenuation lesions in both kidneys, too small to characterize, but statistically likely to represent tiny cysts. Bilateral adrenal glands are normal in appearance. No hydroureteronephrosis. Urinary bladder is normal in appearance.  Stomach/Bowel: Normal appearance of the stomach. No pathologic dilatation of small bowel or colon. Normal appendix.  Vascular/Lymphatic: Aortic atherosclerosis, with vascular findings and measurements pertinent to potential TAVR procedure, as detailed below. No aneurysm or dissection noted in the abdominal or pelvic vasculature. No lymphadenopathy noted in the abdomen or pelvis.  Reproductive: Status post hysterectomy. Ovaries are not confidently identified may be surgically absent or atrophic.  Other: No significant volume of ascites. No pneumoperitoneum.  Musculoskeletal: There are no aggressive appearing lytic or blastic lesions noted in the visualized portions of the skeleton.  VASCULAR MEASUREMENTS PERTINENT TO TAVR:  AORTA:  Minimal Aortic Diameter-14 x 14 mm  Severity of Aortic Calcification-mild  RIGHT PELVIS:  Right Common Iliac Artery -  Minimal Diameter-9.5 x 9.1 mm  Tortuosity-mild  Calcification-none  Right External Iliac Artery -  Minimal  Diameter-7.6 x 7.6 mm  Tortuosity-moderate  Calcification-none  Right Common Femoral Artery -  Minimal Diameter-7.9 x 8.5 mm  Tortuosity-mild  Calcification-none  LEFT PELVIS:  Left Common Iliac Artery -  Minimal Diameter-9.7 x 9.7 mm  Tortuosity-mild  Calcification-none  Left External Iliac Artery -  Minimal Diameter-6.6 x 6.9 mm  Tortuosity-moderate  Calcification-none  Left Common Femoral Artery -  Minimal Diameter-8.1 x 7.9 mm  Tortuosity-mild  Calcification-none  Review of the MIP images confirms the above findings.  IMPRESSION: 1. Vascular findings and measurements pertinent to potential TAVR procedure, as detailed above. 2. Severe thickening calcification of the aortic valve, compatible with reported clinical history of severe aortic stenosis. 3. Cardiomegaly with concentric left ventricular hypertrophy. 4. Additional incidental findings, as above.   Electronically Signed By: Trudie Reed M.D. On: 08/15/2019 15:38    Impression:  Patient has stage D severe symptomatic aortic stenosis. She describes a long history of progressive symptoms of exertional shortness of breath and fatigue that are likely multifactorial but consistent with worsening symptoms of chronic diastolic congestive heart failure, [place] Heart Association functional class III. She has also been having increasing dizzy spells and atypical chest pain.  I have personally reviewed the patient's most recent transthoracic echocardiogram, diagnostic cardiac catheterization, and CT angiograms. Echocardiogram performed October 2020 revealed severe aortic stenosis with normal left ventricular systolic function. 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 Peak velocity across aortic valve measured greater than 4.1 m/s corresponding to mean transvalvular gradient estimated 40 mmHg. DVI was reported 0.22 and aortic valve area  calculated only 0.70 cm. Diagnostic cardiac catheterization revealed stable nonobstructive coronary artery disease and confirmed the presence of aortic stenosis with mean transvalvular gradient measured 32 mmHg corresponding to aortic valve area calculated 1.1 cm. There was mild pulmonary hypertension. Baseline EKG reveals sinus rhythm without significant AV conduction delay.  CT angiography reveals no significant complicating features and no contraindications to peripheral cannulation for surgery.  I agree the patient would benefit from aortic valve replacement. Risks associated with conventional surgery would be relatively low although slightly elevated by the patient's comorbid medical problems and physical deconditioning. She appears to be an acceptable candidate for minimally invasive approach for surgery.     Plan:  The patientwas againcounseled at length regarding treatment alternatives for management of severe symptomatic aortic stenosis. Alternative approaches such as conventional aortic valve replacement, transcatheter aortic valve replacement, and continued medical therapy without intervention were compared and contrasted at length. The risks associated with conventional surgical aortic valve replacement were discussed in detail, as were expectations for post-operative convalescence, alternative surgical approaches, prosthetic valve choices, and the presence or absence of other concomitant conditions which might require intervention.  Discussion was held comparing the relative risks of mechanical valve replacement with need for lifelong anticoagulation versus use of a bioprosthetic tissue valve and the associated potential for late structural valve deterioration and failure.  This discussion was placed in the context of the patient's particular circumstances, and as a result the patient specifically requests that their valve be replaced using a bioprosthetic tissue valve .   Alternative surgical approaches have been discussed including a comparison between conventional sternotomy and minimally-invasive techniques.  The relative risks and benefits of each have been reviewed as they pertain to the patient's specific circumstances, and  all of their questions have been addressed.   The patient understands and accepts all potential associated risks of surgery including but not limited to risk of death, stroke, myocardial infarction, congestive heart failure, respiratory failure, renal failure, pneumonia, bleeding requiring blood transfusion and or reexploration, arrhythmia, heart block or bradycardia requiring permanent pacemaker, aortic dissection or other major vascular complication, pleural effusions or other delayed complications related to continued congestive heart failure, and other late complications related to valve replacement including structural valve deterioration and failure, thrombosis, endocarditis, or paravalvular leak. Specific risks potentially related to the minimally-invasive approach were discussed at length, including but not limited to risk of conversion to full or partial sternotomy, aortic dissection or other major vascular complication, unilateral acute lung injury or pulmonary edema, phrenic nerve dysfunction or paralysis, rib fracture, chronic pain, lung hernia, or lymphocele.      Salvatore Decent. Cornelius Moras, MD 09/04/2019 1:41 PM

## 2019-09-07 NOTE — Anesthesia Postprocedure Evaluation (Signed)
Anesthesia Post Note  Patient: Natasha Taylor  Procedure(s) Performed: MINIMALLY INVASIVE AORTIC VALVE REPLACEMENT (AVR) using Edwards Lifesciences INSPIRIS Resilia 21 MM Aortic Valve. (N/A Chest) TRANSESOPHAGEAL ECHOCARDIOGRAM (TEE) (N/A )     Patient location during evaluation: SICU Anesthesia Type: General Level of consciousness: sedated Pain management: pain level controlled Vital Signs Assessment: post-procedure vital signs reviewed and stable Respiratory status: respiratory function stable and patient connected to face mask oxygen Cardiovascular status: stable Postop Assessment: no apparent nausea or vomiting Anesthetic complications: no   No complications documented.  Last Vitals:  Vitals:   09/07/19 0721 09/07/19 1430  BP:    Pulse: 65   Resp: 13   Temp:  (!) 35.5 C  SpO2: 100%     Last Pain:  Vitals:   09/07/19 1430  TempSrc: Core  PainSc:                  [name] DANIEL

## 2019-09-08 ENCOUNTER — Inpatient Hospital Stay (HOSPITAL_COMMUNITY): Payer: Medicare HMO

## 2019-09-08 LAB — PREPARE FRESH FROZEN PLASMA: Unit division: 0

## 2019-09-08 LAB — BASIC METABOLIC PANEL
Anion gap: 9 (ref 5–15)
Anion gap: 8 (ref 5–15)
BUN: 9 mg/dL (ref 6–20)
BUN: 16 mg/dL (ref 6–20)
CO2: 22 mmol/L (ref 22–32)
CO2: 22 mmol/L (ref 22–32)
Calcium: 7.8 mg/dL — ABNORMAL LOW (ref 8.9–10.3)
Calcium: 8 mg/dL — ABNORMAL LOW (ref 8.9–10.3)
Chloride: 110 mmol/L (ref 98–111)
Chloride: 108 mmol/L (ref 98–111)
Creatinine, Ser: 0.81 mg/dL (ref 0.44–1.00)
Creatinine, Ser: 1.18 mg/dL — ABNORMAL HIGH (ref 0.44–1.00)
GFR calc Af Amer: >60 mL/min (ref >60)
GFR calc Af Amer: 58 mL/min — ABNORMAL LOW (ref >60)
GFR calc non Af Amer: >60 mL/min (ref >60)
GFR calc non Af Amer: 50 mL/min — ABNORMAL LOW (ref >60)
Glucose, Bld: 130 mg/dL — ABNORMAL HIGH (ref 70–99)
Glucose, Bld: 134 mg/dL — ABNORMAL HIGH (ref 70–99)
Potassium: 3.8 mmol/L (ref 3.5–5.1)
Potassium: 4.1 mmol/L (ref 3.5–5.1)
Sodium: 141 mmol/L (ref 135–145)
Sodium: 138 mmol/L (ref 135–145)

## 2019-09-08 LAB — CBC
HCT: 24.2 % — ABNORMAL LOW (ref 36.0–46.0)
HCT: 33.7 % — ABNORMAL LOW (ref 36.0–46.0)
Hemoglobin: 7.9 g/dL — ABNORMAL LOW (ref 12.0–15.0)
Hemoglobin: 11.1 g/dL — ABNORMAL LOW (ref 12.0–15.0)
MCH: 30.4 pg (ref 26.0–34.0)
MCH: 30.2 pg (ref 26.0–34.0)
MCHC: 32.6 g/dL (ref 30.0–36.0)
MCHC: 32.9 g/dL (ref 30.0–36.0)
MCV: 93.1 fL (ref 80.0–100.0)
MCV: 91.6 fL (ref 80.0–100.0)
Platelets: 127 10*3/uL — ABNORMAL LOW (ref 150–400)
Platelets: 101 10*3/uL — ABNORMAL LOW (ref 150–400)
RBC: 2.6 MIL/uL — ABNORMAL LOW (ref 3.87–5.11)
RBC: 3.68 MIL/uL — ABNORMAL LOW (ref 3.87–5.11)
RDW: 13 % (ref 11.5–15.5)
RDW: 15 % (ref 11.5–15.5)
WBC: 10.4 10*3/uL (ref 4.0–10.5)
WBC: 11.3 10*3/uL — ABNORMAL HIGH (ref 4.0–10.5)
nRBC: 0 % (ref 0.0–0.2)
nRBC: 0 % (ref 0.0–0.2)

## 2019-09-08 LAB — BPAM FFP
Blood Product Expiration Date: 202106222359
Blood Product Expiration Date: 202106222359
ISSUE DATE / TIME: 202106171639
ISSUE DATE / TIME: 202106171639
Unit Type and Rh: 5100
Unit Type and Rh: 5100

## 2019-09-08 LAB — GLUCOSE, CAPILLARY
Glucose-Capillary: 100 mg/dL — ABNORMAL HIGH (ref 70–99)
Glucose-Capillary: 104 mg/dL — ABNORMAL HIGH (ref 70–99)
Glucose-Capillary: 125 mg/dL — ABNORMAL HIGH (ref 70–99)
Glucose-Capillary: 131 mg/dL — ABNORMAL HIGH (ref 70–99)
Glucose-Capillary: 133 mg/dL — ABNORMAL HIGH (ref 70–99)
Glucose-Capillary: 144 mg/dL — ABNORMAL HIGH (ref 70–99)
Glucose-Capillary: 147 mg/dL — ABNORMAL HIGH (ref 70–99)
Glucose-Capillary: 150 mg/dL — ABNORMAL HIGH (ref 70–99)
Glucose-Capillary: 88 mg/dL (ref 70–99)

## 2019-09-08 LAB — MAGNESIUM
Magnesium: 2.8 mg/dL — ABNORMAL HIGH (ref 1.7–2.4)
Magnesium: 2.6 mg/dL — ABNORMAL HIGH (ref 1.7–2.4)

## 2019-09-08 LAB — PREPARE RBC (CROSSMATCH)

## 2019-09-08 LAB — SURGICAL PATHOLOGY

## 2019-09-08 MED ORDER — POTASSIUM CHLORIDE 10 MEQ/50ML IV SOLN
10.0000 meq | INTRAVENOUS | Status: AC
  Administered 2019-09-08 (×3): 10 meq via INTRAVENOUS
  Filled 2019-09-08 (×3): qty 50

## 2019-09-08 MED ORDER — SODIUM CHLORIDE 0.9% IV SOLUTION: Freq: Once | INTRAVENOUS | Status: DC

## 2019-09-08 MED ORDER — METOLAZONE 2.5 MG PO TABS
2.5000 mg | ORAL_TABLET | Freq: Once | ORAL | Status: AC
  Administered 2019-09-08: 2.5 mg via ORAL
  Filled 2019-09-08: qty 1

## 2019-09-08 MED ORDER — FUROSEMIDE 10 MG/ML IJ SOLN
INTRAMUSCULAR | Status: AC
  Filled 2019-09-08: qty 2

## 2019-09-08 MED ORDER — FUROSEMIDE 10 MG/ML IJ SOLN
40.0000 mg | Freq: Once | INTRAMUSCULAR | Status: AC
  Administered 2019-09-08: 40 mg via INTRAVENOUS
  Filled 2019-09-08: qty 4

## 2019-09-08 MED ORDER — FUROSEMIDE 10 MG/ML IJ SOLN
20.0000 mg | Freq: Once | INTRAMUSCULAR | Status: AC
  Administered 2019-09-08: 20 mg via INTRAVENOUS
  Filled 2019-09-08: qty 2

## 2019-09-08 MED ORDER — FUROSEMIDE 10 MG/ML IJ SOLN
20.0000 mg | Freq: Once | INTRAMUSCULAR | Status: AC
  Administered 2019-09-08: 20 mg via INTRAVENOUS

## 2019-09-08 MED ORDER — ENOXAPARIN SODIUM 30 MG/0.3ML ~~LOC~~ SOLN
30.0000 mg | Freq: Every day | SUBCUTANEOUS | Status: DC
  Administered 2019-09-09 – 2019-09-11 (×3): 30 mg via SUBCUTANEOUS
  Filled 2019-09-08 (×3): qty 0.3

## 2019-09-08 MED ORDER — INSULIN ASPART 100 UNIT/ML ~~LOC~~ SOLN: 0.0000 [IU] | SUBCUTANEOUS | Status: DC

## 2019-09-08 NOTE — Progress Notes (Signed)
Patient ID: Natasha Taylor, female   DOB: 27-Sep-1960, 59 y.o.   MRN: [id number] [facility] Evening Rounds:  Hemodynamically stable in sinus rhythm.  On 12L HFNC with sats 89%.  Transfused 2 units PRBC's today and had 20 +20 lasix with minimal diuresis. Will give her 40 mg with 2.5 metolazone.  CBC    Component Value Date/Time   WBC 11.3 (H) 09/08/2019 1621   RBC 3.68 (L) 09/08/2019 1621   HGB 11.1 (L) 09/08/2019 1621   HGB 14.8 07/19/2019 1307   HCT 33.7 (L) 09/08/2019 1621   HCT 43.8 07/19/2019 1307   PLT 101 (L) 09/08/2019 1621   PLT 256 07/19/2019 1307   MCV 91.6 09/08/2019 1621   MCV 91 07/19/2019 1307   MCH 30.2 09/08/2019 1621   MCHC 32.9 09/08/2019 1621   RDW 15.0 09/08/2019 1621   RDW 13.1 07/19/2019 1307   BMET    Component Value Date/Time   NA 138 09/08/2019 1621   NA 142 07/19/2019 1307   K 4.1 09/08/2019 1621   CL 108 09/08/2019 1621   CO2 22 09/08/2019 1621   GLUCOSE 134 (H) 09/08/2019 1621   BUN 16 09/08/2019 1621   BUN 11 07/19/2019 1307   CREATININE 1.18 (H) 09/08/2019 1621   CREATININE 0.87 04/22/2015 1147   CALCIUM 8.0 (L) 09/08/2019 1621   GFRNONAA 50 (L) 09/08/2019 1621   GFRAA 58 (L) 09/08/2019 1621   Walked a little today.

## 2019-09-08 NOTE — Progress Notes (Addendum)
[facility] DAILY ICU PROGRESS NOTE                   [address]            [place] [address]          [phone]   1 Day Post-Op Procedure(s) (LRB): MINIMALLY INVASIVE AORTIC VALVE REPLACEMENT (AVR) using [employer] Lifesciences INSPIRIS Resilia 21 MM Aortic Valve. (N/A) TRANSESOPHAGEAL ECHOCARDIOGRAM (TEE) (N/A)  Total Length of Stay:  LOS: 1 day   Subjective: Extubated in the OR.  Awake and alert. Complains primarily of back pain. Denies shortness of breath but O2 has been increased to 12L hi-flow to maintain adequate saturations.   Neo at 20mcg/min.  Objective: Vital signs in last 24 hours: Temp:  [95.9 F (35.5 C)-99.7 F (37.6 C)] 99.3 F (37.4 C) (06/18 0800) Pulse Rate:  [80] 80 (06/17 1900) Cardiac Rhythm: Atrial paced (06/18 0800) Resp:  [13-33] 21 (06/18 0800) BP: (87-113)/(56-87) 100/61 (06/18 0800) SpO2:  [87 %-100 %] 93 % (06/18 0800) Arterial Line BP: (96-130)/(49-70) 101/53 (06/18 0800) Weight:  [90 kg] 90 kg (06/18 0500)  Filed Weights   09/07/19 0610 09/08/19 0500  Weight: 83.9 kg 90 kg    Weight change: 6.084 kg   Hemodynamic parameters for last 24 hours: PAP: (24-43)/(11-23) 37/19 CO:  [3.2 L/min-5.4 L/min] 4.9 L/min CI:  [1.7 L/min/m2-2.9 L/min/m2] 2.6 L/min/m2  Intake/Output from previous day: 06/17 0701 - 06/18 0700 In: 9147 [P.O.:720; I.V.:4057.6; Blood:996; IV Piggyback:2109.4] Out: 4780 [Urine:3720; Chest Tube:1060]  Intake/Output this shift: Total I/O In: 815.2 [I.V.:224.7; Blood:545; IV Piggyback:45.6] Out: 20 [Chest Tube:20]  Current Meds: Scheduled Meds: . sodium chloride   Intravenous Once  . acetaminophen  1,000 mg Oral Q6H  . aspirin EC  325 mg Oral Daily  . bisacodyl  10 mg Oral Daily   Or  . bisacodyl  10 mg Rectal Daily  . Chlorhexidine Gluconate Cloth  6 each Topical Daily  . divalproex  500 mg Oral Daily  . docusate sodium  200 mg Oral Daily  . [START ON 09/09/2019] enoxaparin (LOVENOX) injection  30 mg  Subcutaneous QHS  . furosemide  20 mg Intravenous Once  . insulin aspart  0-24 Units Subcutaneous Q4H  . levothyroxine  75 mcg Oral Q0600  . mouth rinse  15 mL Mouth Rinse BID  . [START ON 09/09/2019] pantoprazole  40 mg Oral Daily  . sodium chloride flush  10-40 mL Intracatheter Q12H  . sodium chloride flush  3 mL Intravenous Q12H   Continuous Infusions: . sodium chloride    . cefUROXime (ZINACEF)  IV Stopped (09/07/19 2057)  . lactated ringers    . lactated ringers 20 mL/hr at 09/08/19 0800  . phenylephrine (NEO-SYNEPHRINE) Adult infusion 30 mcg/min (09/08/19 0800)  . potassium chloride 50 mL/hr at 09/08/19 0800   PRN Meds:.dextrose, fentaNYL (SUBLIMAZE) injection, metoprolol tartrate, ondansetron (ZOFRAN) IV, oxyCODONE, sodium chloride flush, sodium chloride flush, traMADol  General appearance: alert, cooperative and moderate distress Neurologic: intact Heart: A-paced at 80, intrinsic NSR at 75/min  Lungs: Breath sounds clear, shallow. CT drainage moderate overnight but is tapering off.  Abdomen: soft, NT, absent bowel sounds Extremities: Mild edema. Expected bruising at right femoral cannulation sites, no hematoma.  Wound: the right chest incision is covered with a dry Aquacel dressing.   Lab Results: CBC: Recent Labs    09/07/19 2023 09/08/19 0409  WBC 8.8 10.4  HGB 8.4* 7.9*  HCT 25.3* 24.2*  PLT 114*  127*   BMET:  Recent Labs    09/07/19 2023 09/08/19 0409  NA 142 141  K 4.0 3.8  CL 112* 110  CO2 24 22  GLUCOSE 143* 130*  BUN 7 9  CREATININE 0.75 0.81  CALCIUM 7.6* 7.8*    CMET: Lab Results  Component Value Date   WBC 10.4 09/08/2019   HGB 7.9 (L) 09/08/2019   HCT 24.2 (L) 09/08/2019   PLT 127 (L) 09/08/2019   GLUCOSE 130 (H) 09/08/2019   ALT 19 09/04/2019   AST 25 09/04/2019   NA 141 09/08/2019   K 3.8 09/08/2019   CL 110 09/08/2019   CREATININE 0.81 09/08/2019   BUN 9 09/08/2019   CO2 22 09/08/2019   INR 1.6 (H) 09/07/2019   HGBA1C 5.3  09/04/2019      PT/INR:  Recent Labs    09/07/19 1424  LABPROT 18.1*  INR 1.6*   Radiology: DG Chest Port 1 View  Result Date: 09/07/2019 CLINICAL DATA:  Aortic valve replacement EXAM: PORTABLE CHEST 1 VIEW COMPARISON:  September 04, 2019 FINDINGS: There is a Swan-Ganz catheter present with tip in the proximal right inter lobar pulmonary artery. There is a right-sided chest tube. Temporary pacemaker wires are attached to the right heart. No pneumothorax. The lungs are clear. Heart is mildly enlarged with pulmonary vascularity normal. Aortic valve replacement present. No adenopathy evident. No bone lesions. IMPRESSION: Tube and catheter positions as described without pneumothorax. Prosthetic aortic valve present. Heart is mildly enlarged. Lungs clear. Electronically Signed   By: Lowella Grip III M.D.   On: 09/07/2019 15:21     Assessment/Plan: S/P Procedure(s) (LRB): MINIMALLY INVASIVE AORTIC VALVE REPLACEMENT (AVR) using [employer] INSPIRIS Resilia 21 MM Aortic Valve. (N/A) TRANSESOPHAGEAL ECHOCARDIOGRAM (TEE) (N/A)  -POD1 AVR with a 26mm Inspiris tissue valve for severe aortic stenosis. Stable hemodynamically, weaning Neo. Will d/c PA catheter and arterial line. Leave CT's. D/C pacer. Mobilize.   -Expected acute blood loss anemia- Transfusing 2 units PRBC's this morning. CT drainage is tapering off. Monitor.  -Respiratory insufficiency- Low lung volumes and some ATX on CXR. encouraging pulmonary hygiene.   -Volume Excess- Wt 7kg+ from admission. Diurese as BP tolerates.  -DVT PPX- Start enoxaparin SQ today.    Antony Odea, PA-C [phone] 09/08/2019 8:07 AM   I have seen and examined the patient and agree with the assessment and plan as outlined.  Overall doing well POD1.  Biggest complaint is back pain which is chronic but increased by lying in bed.  Maintaining NSR w/ stable hemodynamics on low dose Neo for BP support.  Breathing comfortably although  currently on HFNC due to borderline O2 sats - patient has OSA and significant atelectasis on pCXR.  Mobilize.  Diuresis.  D/C lines.  Leave chest tubes until output decreases.   Rexene Alberts, MD 09/08/2019 8:45 AM

## 2019-09-08 NOTE — Discharge Instructions (Signed)

## 2019-09-08 NOTE — Hospital Course (Signed)
Natasha Taylor was admitted to the hospital for elective surgery and taken to the OR on 09/07/19 where minimally invasive aortic valve replacement was accomplished without complication. Following the procedure she separated from cardiopulmonary bypass with difficulty. She was transferred to the CVICU where she remained hemodynamically stable.  She was extubated routinely.  She was mobilized on the first post-op day. She was transfused with 2 units PRBC's on POD-1 for Hct of 24% and relative hypotension requiring Neo-synephrine for support. Chest tube drainage was initially moderate but began to taper off on the first post-op day. She was in a normal SR on post-op day 1 so the temporary pacing was discontinued.

## 2019-09-08 NOTE — Discharge Summary (Addendum)
Physician Discharge Summary  Patient ID: ZEMA LIZARDO MRN: [id number] DOB/AGE: 59-Aug-1962 59 y.o.  Admit date: 09/07/2019 Discharge date: 09/12/2019  Admission Diagnoses: Severe aortic stenosis Obesity  Discharge Diagnoses:   Aortic stenosis S/P minimally-invasive aortic valve replacement with bioprosthetic valve Morbid obesity (HCC) Expected acute blood loss anemia     Discharged Condition: good  History of Present Illness:  Patient is a 59 year old obese female with bicuspid aortic valve, hypertension, hyperlipidemia, GE reflux disease, scleroderma, rheumatoid arthritis, and obstructive sleep apnea on CPAP who has been referred for surgical consultation to discuss treatment options for management of severe symptomatic aortic stenosis.  Patient states that she has a long history of chronic shortness of breath. She was first noted to have a heart murmur approximately 4 or5 years ago. Echocardiogram revealed possible bicuspid aortic valve with moderate aortic stenosis. She has been followed carefully ever since by Dr. Jens Som. Patient has developed progressive symptoms of exertional shortness of breath and most recent transthoracic echocardiogram performed January 05, 2019 revealed preserved left ventricular systolic function with progression and severity of aortic valve disease with peak velocity across aortic valve measured greater than 4.1 m/s corresponding to mean transvalvular gradient estimated 40 mmHg. The patient underwent diagnostic cardiac catheterization Jul 26, 2019 which confirmed the presence of aortic stenosis and revealed nonobstructive coronary artery disease with stable mild proximal stenosis of the left anterior descending coronary artery, unchanged from catheterization performed in 2017. Cardiothoracic surgical consultation was requested.  Patient is married and lives locally in [place] with her husband. She has been disabled for more than 20 years having  previously worked for [employer]. She originally went out on disability when she first developed symptoms related to scleroderma. She lives a sedentary lifestyle. She is chronically limited by exertional shortness of breath which reportedly has gotten worse recently. She now gets short of breath with low-level activity and when laying flat in bed. She reports occasional brief episodes of tightness across her chest. She has occasional palpitations and lower extremity edema. She has had mild dizziness without syncope. She has stable chronic symptoms of dysphagia related to scleroderma. Occasionally food gets stuck because of esophageal dysmotility but she has not had significant stricture formation to her knowledge. She has chronic dry cough. She uses CPAP for sleep apnea at night. 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 She is not very active physically. She uses a cane for long distance walking. She has recently developed some mild blurriness of her vision in both eyes since she started taking oral Plaquenil for her rheumatoid arthritis.  Patient was originally seen in consultation on Aug 07, 2019 at which time we made tentative plans for elective aortic valve replacement later this week. She returns to the office today and reports no new problems or complaints. She describes stable symptoms of exertional shortness of breath and fatigue consistent with chronic diastolic congestive heart failure, New York Heart Association functional class III.   Hospital Course:   Mrs. Julson presented to [facility] on [facility]07[facility].  She was taken to the operating room and underwent minimally invasive aortic valve replacement with a 21 mm Edwards Inspiris Resilia Bovine Pericardial Tissue Valve.  She tolerated the procedure without difficulty and was taken to the SICU in stable condition.  The patient was extubated the  evening of surgery.  During her stay in the SICU the patient was weaned off Neo synephrine drip as tolerated.  Her  chest tubes and arterial lines were removed without difficulty.  She was transfused 2 units of packed cells for expected post operative blood loss anemia.  She was diuresed to help with volume overload status.  She had problems with migraine headaches during her stay.  This resolved with use of Imitrex.  She is ambulating without difficulty.  Her surgical incisions are healing without evidence of infection.  She is medically stable for discharge home today.        Significant Diagnostic Studies:   ECHOCARDIOGRAM REPORT       Patient Name:  NIKOLETTE REINDL Date of Exam: 01/05/2019  Medical Rec #: [id number]    Height:    63.0 in  Accession #:  [id number]    Weight:    190.0 lb  Date of Birth: 1960/12/29     BSA:     1.89 m  Patient Age:  58 years     BP:      135/104 mmHg  Patient Gender: F        HR:      70 bpm.  Exam Location: [address]   Procedure: 2D Echo, Cardiac Doppler and Color Doppler   Indications:  I35.0 Aortic Stenosis    History:    Patient has prior history of Echocardiogram examinations,  most         recent 01/03/2018. CAD Signs/Symptoms:Murmur and         Dizziness/Lightheadedness Risk Factors:Sleep Apnea and         Hypertension.    Sonographer:  Clearence Ped RCS  Referring Phys: [id number] BRIAN S CRENSHAW   IMPRESSIONS    1. Left ventricular ejection fraction, by visual estimation, is 55 to  60%. The left ventricle has normal function. Left ventricular septal wall  thickness was moderately increased. There is moderately increased left  ventricular hypertrophy.  2. Global right ventricle has normal systolic function.The right  ventricular size is normal. No increase in right ventricular wall  thickness.  3. Left atrial size was mildly dilated.  4. Right  atrial size was normal.  5. The mitral valve is normal in structure. Trace mitral valve  regurgitation.  6. The tricuspid valve is normal in structure. Tricuspid valve  regurgitation is mild.  7. The aortic valve has an indeterminant number of cusps Aortic valve  regurgitation is mild by color flow Doppler. Severe aortic valve stenosis.  8. There is Moderate thickening of the aortic valve.  9. There is Severe calcifcation of the aortic valve.  10. AV calcified with restricted motion Inderterminate number of leaflets  Previous echo done 01/03/18 suggested moderate AS with mean gradient 22  peak 44 mmHg Suprasternal CW signal on this study suggests severe AS with  mean gradient 40 mmHg and peak 70  mmhg.  11. The pulmonic valve was grossly normal. Pulmonic valve regurgitation is  mild by color flow Doppler.   FINDINGS  Left Ventricle: Left ventricular ejection fraction, by visual estimation,  is 55 to 60%. The left ventricle has normal function. Left ventricular  septal wall thickness was moderately increased. There is moderately  increased left ventricular hypertrophy.   Right Ventricle: The right ventricular size is normal. No increase in  right ventricular wall thickness. Global RV systolic function is has  normal systolic function.   Left Atrium: Left atrial size was mildly dilated.   Right Atrium: Right atrial size was normal in size   Pericardium: There is no evidence of pericardial effusion.   Mitral Valve:  The mitral valve is normal in structure. There is mild  thickening of the mitral valve leaflet(s). Trace mitral valve  regurgitation.   Tricuspid Valve: The tricuspid valve is normal in structure. Tricuspid  valve regurgitation is mild by color flow Doppler.   Aortic Valve: The aortic valve has an indeterminant number of cusps. There  is Moderate thickening and Severe calcifcation of the aortic valve. Aortic  valve regurgitation is mild by color flow Doppler.  Aortic regurgitation  PHT measures 494 msec. Severe  aortic stenosis is present. There is Moderate thickening of the aortic  valve. Severe calcifcation. Aortic valve mean gradient measures 40.0 mmHg.  Aortic valve peak gradient measures 69.6 mmHg. Aortic valve area, by VTI  measures 0.70 cm. AV calcified  with restricted motion Inderterminate number of leaflets Previous echo  done 01/03/18 suggested moderate AS with mean gradient 22 peak 44 mmHg  Suprasternal CW signal on this study suggests severe AS with mean gradient  40 mmHg and peak 70 mmhg.   Pulmonic Valve: The pulmonic valve was grossly normal. Pulmonic valve  regurgitation is mild by color flow Doppler.   Aorta: The aortic root is normal in size and structure.   IAS/Shunts: No atrial level shunt detected by color flow Doppler.      LEFT VENTRICLE  PLAX 2D  LVIDd:     3.57 cm Diastology  LVIDs:     2.37 cm LV e' lateral:  5.33 cm/s  LV PW:     1.48 cm LV E/e' lateral: 12.1  LV IVS:    1.60 cm LV e' medial:  4.68 cm/s  LVOT diam:   2.00 cm LV E/e' medial: 13.8  LV SV:     34 ml  LV SV Index:  16.96  LVOT Area:   3.14 cm     RIGHT VENTRICLE  RV Basal diam: 3.39 cm  RV S prime:   13.80 cm/s  TAPSE (M-mode): 2.4 cm   LEFT ATRIUM       Index    RIGHT ATRIUM      Index  LA diam:    3.90 cm 2.06 cm/m RA Area:   12.30 cm  LA Vol (A2C):  42.5 ml 22.46 ml/m RA Volume:  24.70 ml 13.06 ml/m  LA Vol (A4C):  39.9 ml 21.09 ml/m  LA Biplane Vol: 43.1 ml 22.78 ml/m  AORTIC VALVE  AV Area (Vmax):  0.72 cm  AV Area (Vmean):  0.75 cm  AV Area (VTI):   0.70 cm  AV Vmax:      417.00 cm/s  AV Vmean:     293.000 cm/s  AV VTI:      1.000 m  AV Peak Grad:   69.6 mmHg  AV Mean Grad:   40.0 mmHg  LVOT Vmax:     95.30 cm/s  LVOT Vmean:    70.400 cm/s  LVOT VTI:     0.222 m  LVOT/AV VTI ratio: 0.22  AI PHT:       494 msec    AORTA  Ao Root diam: 2.80 cm   MITRAL VALVE  MV Area (PHT): 3.08 cm       SHUNTS  MV PHT:    71.34 msec      Systemic VTI: 0.22 m  MV Decel Time: 246 msec       Systemic Diam: 2.00 cm  MV E velocity: 64.50 cm/s 103 cm/s  MV A velocity: 84.40 cm/s 70.3 cm/s  MV E/A ratio: 0.76  1.5     Charlton Haws MD  Electronically signed by Charlton Haws MD  Signature Date/Time: 01/05/2019/4:20:27 PM      EKG:NSR w/out significant AV conduction delay     RIGHT/LEFT HEART CATH AND CORONARY ANGIOGRAPHY  Conclusion  1. Mild nonobstructive coronary artery disease primarily affecting the proximal LAD, unchanged from the previous cardiac catheterization study in 2017 2. Severe aortic stenosis with a mean gradient of 32 mmHg 3. Mildly elevated pulmonary artery pressure, likely secondary to left heart disease  Recommendation: Cardiac surgical evaluation for consideration of aortic valve replacement.  Indications  Severe aortic stenosis [I35.0 (ICD-10-CM)]  Procedural Details  Technical Details INDICATION: Severe symptomatic aortic stenosis (Stage D1 disease).  PROCEDURAL DETAILS: There was an indwelling IV in a right antecubital vein. Using normal sterile technique, the IV was changed out for a 5 Fr brachial sheath over a 0.018 inch wire. The right wrist was then prepped, draped, and anesthetized with 1% lidocaine. Using ultrasound guidance, a 5/6 French Slender sheath was placed in the right radial artery. Ultrasound images are captured and stored in the patient's chart. Intra-arterial verapamil was administered through the radial artery sheath. IV heparin was administered after a JR4 catheter was advanced into the central aorta. A Swan-Ganz catheter was used for the right heart catheterization. Standard protocol was followed for recording of right heart pressures and sampling of oxygen saturations. Fick cardiac output was calculated.  Standard Judkins catheters were used for selective coronary angiography. 4 French catheters were used because of resistance with 5 French catheters and a small caliber radial artery. LV pressure is recorded and an aortic valve pullback is performed. There were no immediate procedural complications. The patient was transferred to the post catheterization recovery area for further monitoring.    Estimated blood loss <50 mL.   During this procedure medications were administered to achieve and maintain moderate conscious sedation while the patient's heart rate, blood pressure, and oxygen saturation were continuously monitored and I was present face-to-face 100% of this time.  Medications (Filter: Administrations occurring from 07/26/19 1112 to 07/26/19 1215) (important) Continuous medications are totaled by the amount administered until 07/26/19 1215.  fentaNYL (SUBLIMAZE) injection (mcg) Total dose: 50 mcg Date/Time  Rate/Dose/Volume Action  07/26/19 1128  25 mcg Given  1138  25 mcg Given    midazolam (VERSED) injection (mg) Total dose: 2 mg Date/Time  Rate/Dose/Volume Action  07/26/19 1128  1 mg Given  1138  1 mg Given    Heparin (Porcine) in NaCl 1000-0.9 UT/500ML-% SOLN (mL) Total volume: 1,000 mL Date/Time  Rate/Dose/Volume Action  07/26/19 1129  500 mL Given  1129  500 mL Given    lidocaine (PF) (XYLOCAINE) 1 % injection (mL) Total volume: 4 mL Date/Time  Rate/Dose/Volume Action  07/26/19 1137  2 mL Given  1140  2 mL Given    Radial Cocktail/Verapamil only (mL) Total volume: 10 mL Date/Time  Rate/Dose/Volume Action  07/26/19 1142  10 mL Given    heparin sodium (porcine) injection (Units) Total dose: 4,000 Units Date/Time  Rate/Dose/Volume Action  07/26/19 1154  4,000 Units Given    iohexol (OMNIPAQUE) 350 MG/ML injection (mL) Total volume: 55 mL Date/Time  Rate/Dose/Volume Action  07/26/19 1205  55 mL Given    Sedation Time  Sedation  Time Physician-1: 36 minutes 2 seconds  Contrast  Medication Name Total Dose  iohexol (OMNIPAQUE) 350 MG/ML injection 55 mL    Radiation/Fluoro  Fluoro time: 5.9 (min) DAP: 11883 (mGycm2) Cumulative Air Kerma: 210 (  mGy)  Coronary Findings  Diagnostic Dominance: Right Left Main  Vessel is angiographically normal. The left main is patent with no obstruction. It divides into the LAD and left circumflex.  Left Anterior Descending  There is mild diffuse disease throughout the vessel. The LAD covers a large territory. The vessel wraps around the LV apex. There is mild nonobstructive disease in the proximal LAD but no other significant stenosis.  Prox LAD lesion 25% stenosed  Prox LAD lesion is 25% stenosed. There is a mild 25% shelflike plaque in the proximal LAD. This is unchanged from the previous cardiac catheterization study in 2017. The LAD is patent throughout its course with no evidence of high-grade stenosis.  Left Circumflex  The vessel exhibits minimal luminal irregularities. Angiographically normal vessel with no significant obstruction.  Right Coronary Artery  The vessel exhibits minimal luminal irregularities. Dominant RCA, medium in caliber, no significant obstruction.  Intervention  No interventions have been documented. Coronary Diagrams  Diagnostic Dominance: Right    Treatments:   CARDIOTHORACIC SURGERY OPERATIVE NOTE  Date of Procedure:                09/07/2019  Preoperative Diagnosis:      Severe Aortic Stenosis   Postoperative Diagnosis:    Same   Procedure:        Minimally Invasive Aortic Valve Replacement Right Anterior Mini-thoracotomy Edwards Inspiris Resilia Stented Bovine Pericardial Tissue Valve (size 21 mm, catalog #[id number], serial #[id number])              Surgeon:        Valentina Gu. Roxy Manns, MD  Assistant:       Enid Cutter, PA-C  Anesthesia:    Duane Boston, MD  Operative Findings: ? Severe calcific aortic  stenosis ? Normal LV systolic funcion ? Mild-moderate LV hypertrophy             BRIEF CLINICAL NOTE AND INDICATIONS FOR SURGERY  Patient is a 59 year old obese female with bicuspid aortic valve, hypertension, hyperlipidemia, GE reflux disease, scleroderma, rheumatoid arthritis, and obstructive sleep apnea on CPAP who has been referred for surgical consultation to discuss treatment options for management of severe symptomatic aortic stenosis.  Patient states that she has a long history of chronic shortness of breath. She was first noted to have a heart murmur approximately 4 or5 years ago. Echocardiogram revealed possible bicuspid aortic valve with moderate aortic stenosis. She has been followed carefully ever since by Dr. Stanford Breed. Patient has developed progressive symptoms of exertional shortness of breath and most recent transthoracic echocardiogram performed January 05, 2019 revealed preserved left ventricular systolic function with progression and severity of aortic valve disease with peak velocity across aortic valve measured greater than 4.1 m/s corresponding to mean transvalvular gradient estimated 40 mmHg. The patient underwent diagnostic cardiac catheterization Jul 26, 2019 which confirmed the presence of aortic stenosis and revealed nonobstructive coronary artery disease with stable mild proximal stenosis of the left anterior descending coronary artery, unchanged from catheterization performed in 2017. Cardiothoracic surgical consultation was requested.  The patient has been seen in consultation and counseled at length regarding the indications, risks and potential benefits of surgery.  All questions have been answered, and the patient provides full informed consent for the operation as described.   Discharge Exam: Blood pressure (!) 142/97, pulse (!) 108, temperature 97.6 F (36.4 C), temperature source Oral, resp. rate 17, height 5\' 3"  (1.6 m), weight 81.9 kg,  SpO2 99 %.  General appearance: alert, cooperative and no  distress Heart: regular rate and rhythm Lungs: clear to auscultation bilaterally Abdomen: soft, non-tender; bowel sounds normal; no masses,  no organomegaly Extremities: edema trace Wound: clean and dry  Discharge medications:   Discharge Instructions    AMB Referral to Cardiac Rehabilitation - Phase II   Complete by: As directed    Diagnosis: Valve Replacement   Valve: Aortic     Allergies as of 09/12/2019      Reactions   Morphine And Related Hives, Itching   Gabapentin Itching, Other (See Comments)   Brain fog.   Meperidine Itching      Medication List    STOP taking these medications   amLODipine 10 MG tablet Commonly known as: NORVASC     TAKE these medications   acetaminophen 500 MG tablet Commonly known as: TYLENOL Take 2 tablets (1,000 mg total) by mouth every 6 (six) hours as needed for mild pain or fever.   aspirin 325 MG EC tablet Take 1 tablet (325 mg total) by mouth daily. What changed:   medication strength  how much to take   atorvastatin 40 MG tablet Commonly known as: LIPITOR Take 40 mg by mouth daily.   carboxymethylcellulose 0.5 % Soln Commonly known as: REFRESH PLUS Place 1 drop into both eyes daily.   cholecalciferol 25 MCG (1000 UNIT) tablet Commonly known as: VITAMIN D Take 1,000 Units by mouth 2 (two) times daily.   diclofenac 50 MG tablet Commonly known as: CATAFLAM Take 50 mg by mouth 2 (two) times daily as needed (migraine).   divalproex 500 MG 24 hr tablet Commonly known as: DEPAKOTE ER Take 500 mg by mouth daily.   doxycycline 100 MG capsule Commonly known as: VIBRAMYCIN Take 100 mg by mouth as needed (acne break outs).   EQL Omega 3 Fish Oil 1000 MG Caps Take 1,000 mg by mouth 2 (two) times daily.   furosemide 40 MG tablet Commonly known as: LASIX Take 1 tablet (40 mg total) by mouth daily.   lansoprazole 30 MG capsule Commonly known as: PREVACID Take  30 mg by mouth 2 (two) times daily.   levothyroxine 75 MCG tablet Commonly known as: SYNTHROID Take 75 mcg by mouth daily before breakfast.   Melatonin 5 MG Caps Take 5 mg by mouth at bedtime as needed (for sleep).   metoprolol succinate 50 MG 24 hr tablet Commonly known as: TOPROL-XL Take 50 mg by mouth daily.   multivitamin with minerals Tabs tablet Take 1 tablet by mouth daily.   Nurtec 75 MG Tbdp Generic drug: Rimegepant Sulfate Take 1 tablet by mouth daily as needed for headache.   Potassium Chloride ER 20 MEQ Tbcr Take 20 mEq by mouth daily.   traMADol 50 MG tablet Commonly known as: ULTRAM Take 1 tablet (50 mg total) by mouth every 4 (four) hours as needed for moderate pain.   tretinoin 0.05 % cream Commonly known as: RETIN-A Apply 1 application topically daily as needed (Outbreak).   vitamin B-12 1000 MCG tablet Commonly known as: CYANOCOBALAMIN Take 1,000 mcg by mouth daily.       Follow-up Information    Purcell Nails, MD Follow up.   Specialty: Cardiothoracic Surgery Contact information: [address] [place] [address] [phone]              The patient has been discharged on:   1.Beta Blocker:  Yes [ X  ]  No   [   ]                              If No, reason:  2.Ace Inhibitor/ARB: Yes [   ]                                     No  [  x  ]                                     If No, reason: labile BP  3.Statin:   Yes [ X  ]                  No  [   ]                  If No, reason:  4.Ecasa:  Yes  [ X  ]                  No   [   ]                  If No, reason:     Signed: Newell Frater, PA-C 09/12/2019, 9:45 AM

## 2019-09-09 ENCOUNTER — Inpatient Hospital Stay (HOSPITAL_COMMUNITY): Payer: Medicare HMO

## 2019-09-09 LAB — BASIC METABOLIC PANEL
Anion gap: 6 (ref 5–15)
BUN: 22 mg/dL — ABNORMAL HIGH (ref 6–20)
CO2: 25 mmol/L (ref 22–32)
Calcium: 8.5 mg/dL — ABNORMAL LOW (ref 8.9–10.3)
Chloride: 104 mmol/L (ref 98–111)
Creatinine, Ser: 1.09 mg/dL — ABNORMAL HIGH (ref 0.44–1.00)
GFR calc Af Amer: >60 mL/min (ref >60)
GFR calc non Af Amer: 56 mL/min — ABNORMAL LOW (ref >60)
Glucose, Bld: 120 mg/dL — ABNORMAL HIGH (ref 70–99)
Potassium: 3.8 mmol/L (ref 3.5–5.1)
Sodium: 135 mmol/L (ref 135–145)

## 2019-09-09 LAB — GLUCOSE, CAPILLARY
Glucose-Capillary: 100 mg/dL — ABNORMAL HIGH (ref 70–99)
Glucose-Capillary: 103 mg/dL — ABNORMAL HIGH (ref 70–99)
Glucose-Capillary: 105 mg/dL — ABNORMAL HIGH (ref 70–99)
Glucose-Capillary: 119 mg/dL — ABNORMAL HIGH (ref 70–99)
Glucose-Capillary: 67 mg/dL — ABNORMAL LOW (ref 70–99)
Glucose-Capillary: 74 mg/dL (ref 70–99)
Glucose-Capillary: 77 mg/dL (ref 70–99)
Glucose-Capillary: 84 mg/dL (ref 70–99)

## 2019-09-09 LAB — CBC
HCT: 30.2 % — ABNORMAL LOW (ref 36.0–46.0)
Hemoglobin: 9.8 g/dL — ABNORMAL LOW (ref 12.0–15.0)
MCH: 29.6 pg (ref 26.0–34.0)
MCHC: 32.5 g/dL (ref 30.0–36.0)
MCV: 91.2 fL (ref 80.0–100.0)
Platelets: 93 10*3/uL — ABNORMAL LOW (ref 150–400)
RBC: 3.31 MIL/uL — ABNORMAL LOW (ref 3.87–5.11)
RDW: 15.3 % (ref 11.5–15.5)
WBC: 11.4 10*3/uL — ABNORMAL HIGH (ref 4.0–10.5)
nRBC: 0 % (ref 0.0–0.2)

## 2019-09-09 MED ORDER — POTASSIUM CHLORIDE CRYS ER 20 MEQ PO TBCR
20.0000 meq | EXTENDED_RELEASE_TABLET | Freq: Three times a day (TID) | ORAL | Status: AC
  Administered 2019-09-09 (×3): 20 meq via ORAL
  Filled 2019-09-09 (×3): qty 1

## 2019-09-09 MED ORDER — METOLAZONE 5 MG PO TABS
5.0000 mg | ORAL_TABLET | Freq: Once | ORAL | Status: AC
  Administered 2019-09-09: 5 mg via ORAL
  Filled 2019-09-09: qty 1

## 2019-09-09 MED ORDER — INSULIN ASPART 100 UNIT/ML ~~LOC~~ SOLN: 0.0000 [IU] | Freq: Three times a day (TID) | SUBCUTANEOUS | Status: DC

## 2019-09-09 MED ORDER — FUROSEMIDE 10 MG/ML IJ SOLN
40.0000 mg | Freq: Once | INTRAMUSCULAR | Status: AC
  Administered 2019-09-09: 40 mg via INTRAVENOUS
  Filled 2019-09-09: qty 4

## 2019-09-09 NOTE — Progress Notes (Signed)
RT offered pt CPAP for the night and pt declined stating she did not want to wear one. Pt respiratory status is stable at this time. RT will continue to monitor.

## 2019-09-09 NOTE — Progress Notes (Signed)
2 Days Post-Op Procedure(s) (LRB): MINIMALLY INVASIVE AORTIC VALVE REPLACEMENT (AVR) using Edwards [employer]esciences INSPIRIS Resilia 21 MM Aortic Valve. (N/A) TRANSESOPHAGEAL ECHOCARDIOGRAM (TEE) (N/A) Subjective: No complaints.  Objective: Vital signs in last 24 hours: Temp:  [98.1 F (36.7 C)-100.4 F (38 C)] 99.5 F (37.5 C) (06/19 0900) Pulse Rate:  [87-100] 87 (06/18 2310) Cardiac Rhythm: Normal sinus rhythm (06/19 0800) Resp:  [14-38] 17 (06/19 0900) BP: (88-118)/(52-83) 108/75 (06/19 0900) SpO2:  [85 %-97 %] 92 % (06/19 0900) Arterial Line BP: (103-125)/(52-63) 107/52 (06/18 1500) Weight:  [88.7 kg] 88.7 kg (06/19 0500)  Hemodynamic parameters for last 24 hours:    Intake/Output from previous day: 06/18 0701 - 06/19 0700 In: 2326.6 [P.O.:480; I.V.:390; Blood:1175; IV Piggyback:281.6] Out: 1350 [Urine:870; Chest Tube:480] Intake/Output this shift: Total I/O In: 186.4 [P.O.:120; IV Piggyback:66.4] Out: 60 [Urine:40; Chest Tube:20]  General appearance: alert and cooperative Neurologic: intact Heart: regular rate and rhythm Lungs: clear to auscultation bilaterally Extremities: edema mild Wound: dressings dry  Lab Results: Recent Labs    09/08/19 1621 09/09/19 0429  WBC 11.3* 11.4*  HGB 11.1* 9.8*  HCT 33.7* 30.2*  PLT 101* 93*   BMET:  Recent Labs    09/08/19 1621 09/09/19 0429  NA 138 135  K 4.1 3.8  CL 108 104  CO2 22 25  GLUCOSE 134* 120*  BUN 16 22*  CREATININE 1.18* 1.09*  CALCIUM 8.0* 8.5*    PT/INR:  Recent Labs    09/07/19 1424  LABPROT 18.1*  INR 1.6*   ABG    Component Value Date/Time   PHART 7.330 (L) 09/07/2019 2022   HCO3 25.3 09/07/2019 2022   TCO2 27 09/07/2019 2022   ACIDBASEDEF 1.0 09/07/2019 2022   O2SAT 98.0 09/07/2019 2022   CBG (last 3)  Recent Labs    09/09/19 0118 09/09/19 0429 09/09/19 0636  GLUCAP 74 103* 105*   CXR: bibasilar atelectasis, low lung volumes.  Assessment/Plan: S/P Procedure(s)  (LRB): MINIMALLY INVASIVE AORTIC VALVE REPLACEMENT (AVR) using [employer] INSPIRIS Resilia 21 MM Aortic Valve. (N/A) TRANSESOPHAGEAL ECHOCARDIOGRAM (TEE) (N/A)  POD 2  Hemodynamically stable in sinus rhythm.  Oxygen requirement decreasing. 12L to 8 L. Continue IS, diuresis for volume excess.  DC chest tubes.  Continue ambulation.   LOS: 2 days    Alleen Borne 09/09/2019

## 2019-09-09 NOTE — Progress Notes (Signed)
Patient ID: Natasha Taylor, female   DOB: Jan 15, 1961, 59 y.o.   MRN: [id number] [facility] Evening Rounds:  Hemodynamically stable in sinus rhythm 98.  sats 94% on 6L HFNC.  Diuresing with lasix and metolazone.

## 2019-09-10 ENCOUNTER — Inpatient Hospital Stay (HOSPITAL_COMMUNITY): Payer: Medicare HMO

## 2019-09-10 LAB — BASIC METABOLIC PANEL
Anion gap: 8 (ref 5–15)
BUN: 18 mg/dL (ref 6–20)
CO2: 26 mmol/L (ref 22–32)
Calcium: 9.1 mg/dL (ref 8.9–10.3)
Chloride: 102 mmol/L (ref 98–111)
Creatinine, Ser: 0.81 mg/dL (ref 0.44–1.00)
GFR calc Af Amer: >60 mL/min (ref >60)
GFR calc non Af Amer: >60 mL/min (ref >60)
Glucose, Bld: 85 mg/dL (ref 70–99)
Potassium: 4.1 mmol/L (ref 3.5–5.1)
Sodium: 136 mmol/L (ref 135–145)

## 2019-09-10 LAB — GLUCOSE, CAPILLARY: Glucose-Capillary: 76 mg/dL (ref 70–99)

## 2019-09-10 MED ORDER — TRAMADOL HCL 50 MG PO TABS
50.0000 mg | ORAL_TABLET | ORAL | Status: DC | PRN
  Administered 2019-09-10: 50 mg via ORAL
  Filled 2019-09-10: qty 1

## 2019-09-10 MED ORDER — RIMEGEPANT SULFATE 75 MG PO TBDP: 75.0000 mg | ORAL_TABLET | Freq: Every day | ORAL | Status: DC | PRN

## 2019-09-10 MED ORDER — FUROSEMIDE 10 MG/ML IJ SOLN
40.0000 mg | Freq: Once | INTRAMUSCULAR | Status: AC
  Administered 2019-09-10: 40 mg via INTRAVENOUS
  Filled 2019-09-10: qty 4

## 2019-09-10 MED ORDER — METOCLOPRAMIDE HCL 5 MG/ML IJ SOLN
10.0000 mg | Freq: Once | INTRAMUSCULAR | Status: AC
  Administered 2019-09-10: 10 mg via INTRAVENOUS
  Filled 2019-09-10: qty 2

## 2019-09-10 MED ORDER — METOLAZONE 2.5 MG PO TABS
2.5000 mg | ORAL_TABLET | Freq: Once | ORAL | Status: AC
  Administered 2019-09-10: 2.5 mg via ORAL
  Filled 2019-09-10: qty 1

## 2019-09-10 MED ORDER — SUMATRIPTAN SUCCINATE 50 MG PO TABS
50.0000 mg | ORAL_TABLET | ORAL | Status: DC | PRN
  Administered 2019-09-10 – 2019-09-11 (×3): 50 mg via ORAL
  Filled 2019-09-10 (×4): qty 1

## 2019-09-10 MED ORDER — SUMATRIPTAN SUCCINATE 6 MG/0.5ML ~~LOC~~ SOLN
6.0000 mg | Freq: Once | SUBCUTANEOUS | Status: AC
  Administered 2019-09-10: 6 mg via SUBCUTANEOUS
  Filled 2019-09-10: qty 0.5

## 2019-09-10 MED ORDER — POTASSIUM CHLORIDE CRYS ER 20 MEQ PO TBCR
20.0000 meq | EXTENDED_RELEASE_TABLET | Freq: Three times a day (TID) | ORAL | Status: AC
  Administered 2019-09-10 (×3): 20 meq via ORAL
  Filled 2019-09-10 (×3): qty 1

## 2019-09-10 NOTE — Progress Notes (Signed)
3 Days Post-Op Procedure(s) (LRB): MINIMALLY INVASIVE AORTIC VALVE REPLACEMENT (AVR) using [employer] INSPIRIS Resilia 21 MM Aortic Valve. (N/A) TRANSESOPHAGEAL ECHOCARDIOGRAM (TEE) (N/A) Subjective:  Only complaint is of migraine headaches which she has at home and takes Nurtec for. Headache is keeping her from walking.  Objective: Vital signs in last 24 hours: Temp:  [98.2 F (36.8 C)-100.2 F (37.9 C)] 98.6 F (37 C) (06/20 1000) Cardiac Rhythm: Normal sinus rhythm (06/20 0800) Resp:  [12-26] 12 (06/20 1000) BP: (99-141)/(58-79) 123/64 (06/20 1000) SpO2:  [90 %-96 %] 96 % (06/20 1000) Weight:  [87.5 kg] 87.5 kg (06/20 0500)  Hemodynamic parameters for last 24 hours:    Intake/Output from previous day: 06/19 0701 - 06/20 0700 In: 560.1 [P.O.:460; IV Piggyback:100.1] Out: 2145 [Urine:2125; Chest Tube:20] Intake/Output this shift: Total I/O In: 120 [P.O.:120] Out: 200 [Urine:200]  General appearance: alert and cooperative Neurologic: intact Heart: regular rate and rhythm, S1, S2 normal, no murmur Lungs: clear to auscultation bilaterally Extremities: edema mild Wound: incision ok  Lab Results: Recent Labs    09/08/19 1621 09/09/19 0429  WBC 11.3* 11.4*  HGB 11.1* 9.8*  HCT 33.7* 30.2*  PLT 101* 93*   BMET:  Recent Labs    09/09/19 0429 09/10/19 0337  NA 135 136  K 3.8 4.1  CL 104 102  CO2 25 26  GLUCOSE 120* 85  BUN 22* 18  CREATININE 1.09* 0.81  CALCIUM 8.5* 9.1    PT/INR:  Recent Labs    09/07/19 1424  LABPROT 18.1*  INR 1.6*   ABG    Component Value Date/Time   PHART 7.330 (L) 09/07/2019 2022   HCO3 25.3 09/07/2019 2022   TCO2 27 09/07/2019 2022   ACIDBASEDEF 1.0 09/07/2019 2022   O2SAT 98.0 09/07/2019 2022   CBG (last 3)  Recent Labs    09/09/19 1107 09/09/19 1647 09/09/19 2302  GLUCAP 119* 100* 84   CXR: patchy atelectasis  Assessment/Plan: S/P Procedure(s) (LRB): MINIMALLY INVASIVE AORTIC VALVE REPLACEMENT (AVR)  using [employer] INSPIRIS Resilia 21 MM Aortic Valve. (N/A) TRANSESOPHAGEAL ECHOCARDIOGRAM (TEE) (N/A)  POD 3  Hemodynamically stable in sinus rhythm. Continue Lopressor.  Oxygenation improving. Diuresed -1500 cc yesterday. Wt is 8 lbs over preop so continue diuresis. IS, OOB, ambulate.  Migraine headaches: husband is bringing in her Nurtec.  DC sleeve, foley.   LOS: 3 days    Natasha Taylor 09/10/2019

## 2019-09-10 NOTE — Progress Notes (Signed)
Patient ID: Natasha Taylor, female   DOB: 1960/05/02, 59 y.o.   MRN: [id number] TCTS Evening Rounds:  Hemodynamically stable in sinus rhythm.  Diuresed well today. -2300 cc.  sats 99% on 5L HFNC.  Had some Imitrex today and headache improved but then recurred. Husband could not bring in her Nurtec today but will tomorrow.

## 2019-09-10 NOTE — Progress Notes (Signed)
RT placed patient on CPAP with 5L O2 bled into circuit. Patient tolerating CPAP settings well at this time. No respiratory distress noted. RT will monitor as needed.

## 2019-09-11 ENCOUNTER — Inpatient Hospital Stay (HOSPITAL_COMMUNITY): Payer: Medicare HMO

## 2019-09-11 LAB — BPAM RBC
Blood Product Expiration Date: 202107122359
Blood Product Expiration Date: 202107132359
Blood Product Expiration Date: 202107132359
Blood Product Expiration Date: 202107132359
ISSUE DATE / TIME: 202106180526
ISSUE DATE / TIME: 202106180857
Unit Type and Rh: 5100
Unit Type and Rh: 5100
Unit Type and Rh: 5100
Unit Type and Rh: 5100

## 2019-09-11 LAB — TYPE AND SCREEN
ABO/RH(D): O POS
Antibody Screen: NEGATIVE
Unit division: 0
Unit division: 0
Unit division: 0
Unit division: 0

## 2019-09-11 LAB — BASIC METABOLIC PANEL
Anion gap: 11 (ref 5–15)
BUN: 10 mg/dL (ref 6–20)
CO2: 29 mmol/L (ref 22–32)
Calcium: 9.9 mg/dL (ref 8.9–10.3)
Chloride: 98 mmol/L (ref 98–111)
Creatinine, Ser: 0.78 mg/dL (ref 0.44–1.00)
GFR calc Af Amer: >60 mL/min (ref >60)
GFR calc non Af Amer: >60 mL/min (ref >60)
Glucose, Bld: 96 mg/dL (ref 70–99)
Potassium: 3.2 mmol/L — ABNORMAL LOW (ref 3.5–5.1)
Sodium: 138 mmol/L (ref 135–145)

## 2019-09-11 LAB — CBC
HCT: 32 % — ABNORMAL LOW (ref 36.0–46.0)
Hemoglobin: 10.7 g/dL — ABNORMAL LOW (ref 12.0–15.0)
MCH: 30 pg (ref 26.0–34.0)
MCHC: 33.4 g/dL (ref 30.0–36.0)
MCV: 89.6 fL (ref 80.0–100.0)
Platelets: 173 10*3/uL (ref 150–400)
RBC: 3.57 MIL/uL — ABNORMAL LOW (ref 3.87–5.11)
RDW: 14 % (ref 11.5–15.5)
WBC: 10.7 10*3/uL — ABNORMAL HIGH (ref 4.0–10.5)
nRBC: 0.3 % — ABNORMAL HIGH (ref 0.0–0.2)

## 2019-09-11 LAB — GLUCOSE, CAPILLARY: Glucose-Capillary: 42 mg/dL — CL (ref 70–99)

## 2019-09-11 LAB — ECHO INTRAOPERATIVE TEE
Height: 63 in
Weight: 2960 oz

## 2019-09-11 MED ORDER — SODIUM CHLORIDE 0.9 % IV SOLN: 250.0000 mL | INTRAVENOUS | Status: DC | PRN

## 2019-09-11 MED ORDER — ATORVASTATIN CALCIUM 40 MG PO TABS
40.0000 mg | ORAL_TABLET | Freq: Every day | ORAL | Status: DC
  Administered 2019-09-11: 40 mg via ORAL
  Filled 2019-09-11: qty 1

## 2019-09-11 MED ORDER — MENTHOL 3 MG MT LOZG
1.0000 | LOZENGE | OROMUCOSAL | Status: DC | PRN
  Filled 2019-09-11: qty 9

## 2019-09-11 MED ORDER — METOPROLOL SUCCINATE ER 25 MG PO TB24
25.0000 mg | ORAL_TABLET | Freq: Every day | ORAL | Status: DC
  Administered 2019-09-11: 12.5 mg via ORAL
  Administered 2019-09-12: 25 mg via ORAL
  Filled 2019-09-11 (×3): qty 1

## 2019-09-11 MED ORDER — POTASSIUM CHLORIDE CRYS ER 20 MEQ PO TBCR
20.0000 meq | EXTENDED_RELEASE_TABLET | ORAL | Status: AC
  Administered 2019-09-11 (×3): 20 meq via ORAL
  Filled 2019-09-11 (×3): qty 1

## 2019-09-11 MED ORDER — ~~LOC~~ CARDIAC SURGERY, PATIENT & FAMILY EDUCATION: Freq: Once | Status: AC

## 2019-09-11 MED ORDER — SODIUM CHLORIDE 0.9% FLUSH
3.0000 mL | Freq: Two times a day (BID) | INTRAVENOUS | Status: DC
  Administered 2019-09-11 (×2): 3 mL via INTRAVENOUS

## 2019-09-11 MED ORDER — ASPIRIN EC 325 MG PO TBEC
325.0000 mg | DELAYED_RELEASE_TABLET | Freq: Every day | ORAL | Status: DC
  Administered 2019-09-12: 325 mg via ORAL
  Filled 2019-09-11: qty 1

## 2019-09-11 MED ORDER — POTASSIUM CHLORIDE CRYS ER 20 MEQ PO TBCR
40.0000 meq | EXTENDED_RELEASE_TABLET | Freq: Once | ORAL | Status: AC
  Administered 2019-09-11: 40 meq via ORAL
  Filled 2019-09-11: qty 2

## 2019-09-11 MED ORDER — SODIUM CHLORIDE 0.9% FLUSH: 3.0000 mL | INTRAVENOUS | Status: DC | PRN

## 2019-09-11 MED ORDER — SALINE SPRAY 0.65 % NA SOLN
1.0000 | NASAL | Status: DC | PRN
  Filled 2019-09-11: qty 44

## 2019-09-11 MED ORDER — METOPROLOL SUCCINATE ER 25 MG PO TB24
12.5000 mg | ORAL_TABLET | Freq: Every day | ORAL | Status: DC
  Administered 2019-09-11: 12.5 mg via ORAL
  Filled 2019-09-11: qty 1

## 2019-09-11 MED ORDER — FUROSEMIDE 40 MG PO TABS
40.0000 mg | ORAL_TABLET | Freq: Every day | ORAL | Status: DC
  Administered 2019-09-11 – 2019-09-12 (×2): 40 mg via ORAL
  Filled 2019-09-11 (×2): qty 1

## 2019-09-11 NOTE — Progress Notes (Signed)
Patient ID: Natasha Taylor, female   DOB: [birth year], 59 y.o.   MRN: [id number] EVENING ROUNDS NOTE :     [address]       [place] [address]             [phone]                 4 Days Post-Op Procedure(s) (LRB): MINIMALLY INVASIVE AORTIC VALVE REPLACEMENT (AVR) using [employer] INSPIRIS Resilia 21 MM Aortic Valve. (N/A) TRANSESOPHAGEAL ECHOCARDIOGRAM (TEE) (N/A)  Total Length of Stay:  LOS: 4 days  BP 137/70   Pulse (!) 112   Temp (!) 97.5 F (36.4 C) (Oral)   Resp 19   Ht 5\' 3"  (1.6 m)   Wt 81.6 kg   SpO2 95%   BMI 31.87 kg/m   .Intake/Output      06/20 0701 - 06/21 0700 06/21 0701 - 06/22 0700   P.O. 340 720   I.V. (mL/kg) 0 (0)    IV Piggyback     Total Intake(mL/kg) 340 (4.2) 720 (8.8)   Urine (mL/kg/hr) 4350 (2.2) 150 (0.2)   Stool 0 0   Chest Tube     Total Output 4350 150   Net -4010 +570        Urine Occurrence  1 x   Stool Occurrence 1 x 4 x     . sodium chloride    . sodium chloride    . lactated ringers       Lab Results  Component Value Date   WBC 10.7 (H) 09/11/2019   HGB 10.7 (L) 09/11/2019   HCT 32.0 (L) 09/11/2019   PLT 173 09/11/2019   GLUCOSE 96 09/11/2019   ALT 19 09/04/2019   AST 25 09/04/2019   NA 138 09/11/2019   K 3.2 (L) 09/11/2019   CL 98 09/11/2019   CREATININE 0.78 09/11/2019   BUN 10 09/11/2019   CO2 29 09/11/2019   INR 1.6 (H) 09/07/2019   HGBA1C 5.3 09/04/2019   Waiting for floor transfer, may go home tomorrow    09/06/2019 MD  Beeper [phone] Office [phone] 09/11/2019 6:04 PM

## 2019-09-11 NOTE — Progress Notes (Signed)
CARDIAC REHAB PHASE I   Went to offer to walk with pt, RN states pt has walked twice today. Notices increase in HR with ambulation. Last walk ~1h ago. Will f/u tomorrow to encourage ambulation.  Reynold Bowen, RN BSN 09/11/2019 2:05 PM

## 2019-09-11 NOTE — Progress Notes (Addendum)
      [address]       [place] [address]             [phone]      4 Days Post-Op Procedure(s) (LRB): MINIMALLY INVASIVE AORTIC VALVE REPLACEMENT (AVR) using [employer] INSPIRIS Resilia 21 MM Aortic Valve. (N/A) TRANSESOPHAGEAL ECHOCARDIOGRAM (TEE) (N/A)   Subjective:  Patient feeling better today.  Her headache is much improved.  She ambulated this morning w/o much difficulty.  Also able to eat some breakfast.  Objective: Vital signs in last 24 hours: Temp:  [97.6 F (36.4 C)-100.8 F (38.2 C)] 99.7 F (37.6 C) (06/21 0700) Pulse Rate:  [109] 109 (06/20 2341) Cardiac Rhythm: Normal sinus rhythm (06/21 0400) Resp:  [12-29] 18 (06/21 0700) BP: (108-181)/(62-88) 108/72 (06/21 0700) SpO2:  [88 %-98 %] 95 % (06/21 0700) Weight:  [81.6 kg] 81.6 kg (06/21 0500)  Intake/Output from previous day: 06/20 0701 - 06/21 0700 In: 340 [P.O.:340] Out: 4350 [Urine:4350]  General appearance: alert, cooperative and no distress Heart: regular rate and rhythm and tachy Lungs: diminished breath sounds left base Abdomen: soft, non-tender; bowel sounds normal; no masses,  no organomegaly Extremities: edema trace Wound: clean and dry  Lab Results: Recent Labs    09/09/19 0429 09/11/19 0243  WBC 11.4* 10.7*  HGB 9.8* 10.7*  HCT 30.2* 32.0*  PLT 93* 173   BMET:  Recent Labs    09/10/19 0337 09/11/19 0243  NA 136 138  K 4.1 3.2*  CL 102 98  CO2 26 29  GLUCOSE 85 96  BUN 18 10  CREATININE 0.81 0.78  CALCIUM 9.1 9.9    PT/INR: No results for input(s): LABPROT, INR in the last 72 hours. ABG    Component Value Date/Time   PHART 7.330 (L) 09/07/2019 2022   HCO3 25.3 09/07/2019 2022   TCO2 27 09/07/2019 2022   ACIDBASEDEF 1.0 09/07/2019 2022   O2SAT 98.0 09/07/2019 2022   CBG (last 3)  Recent Labs    09/09/19 1647 09/09/19 2302 09/10/19 0633  GLUCAP 100* 84 76    Assessment/Plan: S/P Procedure(s) (LRB): MINIMALLY INVASIVE AORTIC  VALVE REPLACEMENT (AVR) using [employer] INSPIRIS Resilia 21 MM Aortic Valve. (N/A) TRANSESOPHAGEAL ECHOCARDIOGRAM (TEE) (N/A)  1. CV- Sinus Tach, BP is labile ranging low 100s-180s with activity- will restart home Toprol XL at reduced dose as BP allows 2. Pulm- + atelectasis bilaterally, +pleural effusion on left stable, continue CPAP nightly, aggressive IS... if effusion increases may need Thoracentesis 3. Renal- creatinine has been stable, edema much improved, will transition to oral Lasix 40 mg daily 4. Hypokalemia will supplement 5. Hypothyroidism- continue synthroid 6. Neuro- migraine headaches improved, imitrax, nurtec as needed 7. Dispo- patient stable, start Toprol XL at reduced dose, continue duiretics, supplement K.. monitor left pleural effusion, transfer to 4E    LOS: 4 days    Lowella Dandy, PA-C  09/11/2019   I have seen and examined the patient and agree with the assessment and plan as outlined.  Doing well.  Possible d/c home soon if able to wean O2 off.  Purcell Nails, MD 09/11/2019 8:21 AM

## 2019-09-11 NOTE — Progress Notes (Signed)
Epicardial wires removed per order. No complications. VSS.

## 2019-09-12 ENCOUNTER — Inpatient Hospital Stay (HOSPITAL_COMMUNITY): Payer: Medicare HMO

## 2019-09-12 LAB — BASIC METABOLIC PANEL
Anion gap: 13 (ref 5–15)
BUN: 12 mg/dL (ref 6–20)
CO2: 27 mmol/L (ref 22–32)
Calcium: 10.6 mg/dL — ABNORMAL HIGH (ref 8.9–10.3)
Chloride: 98 mmol/L (ref 98–111)
Creatinine, Ser: 0.76 mg/dL (ref 0.44–1.00)
GFR calc Af Amer: >60 mL/min (ref >60)
GFR calc non Af Amer: >60 mL/min (ref >60)
Glucose, Bld: 94 mg/dL (ref 70–99)
Potassium: 3.4 mmol/L — ABNORMAL LOW (ref 3.5–5.1)
Sodium: 138 mmol/L (ref 135–145)

## 2019-09-12 LAB — URINALYSIS, DIPSTICK ONLY
Bilirubin Urine: NEGATIVE
Glucose, UA: NEGATIVE mg/dL
Ketones, ur: NEGATIVE mg/dL
Nitrite: NEGATIVE
Protein, ur: NEGATIVE mg/dL
Specific Gravity, Urine: 1.008 (ref 1.005–1.030)
pH: 7 (ref 5.0–8.0)

## 2019-09-12 LAB — CBC
HCT: 37.6 % (ref 36.0–46.0)
Hemoglobin: 12.3 g/dL (ref 12.0–15.0)
MCH: 29.9 pg (ref 26.0–34.0)
MCHC: 32.7 g/dL (ref 30.0–36.0)
MCV: 91.3 fL (ref 80.0–100.0)
Platelets: 236 10*3/uL (ref 150–400)
RBC: 4.12 MIL/uL (ref 3.87–5.11)
RDW: 14 % (ref 11.5–15.5)
WBC: 8.1 10*3/uL (ref 4.0–10.5)
nRBC: 0.5 % — ABNORMAL HIGH (ref 0.0–0.2)

## 2019-09-12 MED ORDER — METOPROLOL SUCCINATE ER 50 MG PO TB24: 50.0000 mg | ORAL_TABLET | Freq: Every day | ORAL | Status: DC

## 2019-09-12 MED ORDER — TRAMADOL HCL 50 MG PO TABS: 50.0000 mg | ORAL_TABLET | ORAL | 0 refills | Status: DC | PRN

## 2019-09-12 MED ORDER — SULFAMETHOXAZOLE-TRIMETHOPRIM 800-160 MG PO TABS
1.0000 | ORAL_TABLET | Freq: Two times a day (BID) | ORAL | 0 refills | Status: DC
Start: 2019-09-12 — End: 2019-10-04

## 2019-09-12 MED ORDER — ACETAMINOPHEN 500 MG PO TABS: 1000.0000 mg | ORAL_TABLET | Freq: Four times a day (QID) | ORAL | 0 refills | Status: AC | PRN

## 2019-09-12 MED ORDER — ASPIRIN 325 MG PO TBEC: 325.0000 mg | DELAYED_RELEASE_TABLET | Freq: Every day | ORAL | 0 refills | Status: DC

## 2019-09-12 MED ORDER — POTASSIUM CHLORIDE ER 20 MEQ PO TBCR: 20.0000 meq | EXTENDED_RELEASE_TABLET | Freq: Every day | ORAL | 0 refills | Status: DC

## 2019-09-12 MED ORDER — FUROSEMIDE 40 MG PO TABS: 40.0000 mg | ORAL_TABLET | Freq: Every day | ORAL | 0 refills | Status: DC

## 2019-09-12 MED FILL — Heparin Sodium (Porcine) Inj 1000 Unit/ML: INTRAMUSCULAR | Qty: 30 | Status: AC

## 2019-09-12 MED FILL — Potassium Chloride Inj 2 mEq/ML: INTRAVENOUS | Qty: 40 | Status: AC

## 2019-09-12 MED FILL — Lidocaine HCl Local Preservative Free (PF) Inj 2%: INTRAMUSCULAR | Qty: 15 | Status: AC

## 2019-09-12 NOTE — Progress Notes (Signed)
      [address]       [place] [address]             [phone]      5 Days Post-Op Procedure(s) (LRB): MINIMALLY INVASIVE AORTIC VALVE REPLACEMENT (AVR) using [employer] INSPIRIS Resilia 21 MM Aortic Valve. (N/A) TRANSESOPHAGEAL ECHOCARDIOGRAM (TEE) (N/A)   Subjective:  Patient continues to make progress.  She does have some stinging with urination since the foley has been removed.  She denies any blood in her urine.  She also has some mild sinus congestion.  She would like to go home.  Objective: Vital signs in last 24 hours: Temp:  [97.5 F (36.4 C)-100.2 F (37.9 C)] 97.7 F (36.5 C) (06/22 0423) Pulse Rate:  [90-112] 90 (06/22 0423) Cardiac Rhythm: Sinus tachycardia (06/22 0701) Resp:  [16-21] 16 (06/22 0423) BP: (109-137)/(70-100) 131/85 (06/22 0423) SpO2:  [71 %-100 %] 100 % (06/22 0423) Weight:  [81.9 kg-82.4 kg] 81.9 kg (06/22 0208)  Intake/Output from previous day: 06/21 0701 - 06/22 0700 In: 720 [P.O.:720] Out: 150 [Urine:150]  General appearance: alert, cooperative and no distress Heart: regular rate and rhythm Lungs: clear to auscultation bilaterally Abdomen: soft, non-tender; bowel sounds normal; no masses,  no organomegaly Extremities: edema trace Wound: clean and dry  Lab Results: Recent Labs    09/11/19 0243 09/12/19 0355  WBC 10.7* 8.1  HGB 10.7* 12.3  HCT 32.0* 37.6  PLT 173 236   BMET:  Recent Labs    09/11/19 0243 09/12/19 0355  NA 138 138  K 3.2* 3.4*  CL 98 98  CO2 29 27  GLUCOSE 96 94  BUN 10 12  CREATININE 0.78 0.76  CALCIUM 9.9 10.6*    PT/INR: No results for input(s): LABPROT, INR in the last 72 hours. ABG    Component Value Date/Time   PHART 7.330 (L) 09/07/2019 2022   HCO3 25.3 09/07/2019 2022   TCO2 27 09/07/2019 2022   ACIDBASEDEF 1.0 09/07/2019 2022   O2SAT 98.0 09/07/2019 2022   CBG (last 3)  Recent Labs    09/09/19 1647 09/09/19 2302 09/10/19 0633  GLUCAP 100* 84 76     Assessment/Plan: S/P Procedure(s) (LRB): MINIMALLY INVASIVE AORTIC VALVE REPLACEMENT (AVR) using [employer] INSPIRIS Resilia 21 MM Aortic Valve. (N/A) TRANSESOPHAGEAL ECHOCARDIOGRAM (TEE) (N/A)  1. CV- Sinus Tach, BP remains labile- will titrate Toprol to home dose 2. Pulm- CXR with improvement of atelectasis, continue IS, off oxygen 3. Renal- creatinine stable, weight is trending down, continue Lasix for 1 week 4. Hypokalemia improved, will continue potassium with lasix use 5. GU- stinging with urination, likely from foley irritation, will get UA.. if abnormal will start ABX 6. Dispo- patient stable, titrate BB, continue diuretics/potassium, will plan to d/c home today   LOS: 5 days    Lowella Dandy, PA-C  09/12/2019

## 2019-09-12 NOTE — Progress Notes (Signed)
Chest tube sutures removed per order without difficulty.  Dry dressing placed on proximal site d/t scant serosanguineous drainage following suture removal.

## 2019-09-12 NOTE — Plan of Care (Signed)
  Problem: Education: Goal: Knowledge of General Education information will improve Description: Including pain rating scale, medication(s)/side effects and non-pharmacologic comfort measures Outcome: Adequate for Discharge   Problem: Health Behavior/Discharge Planning: Goal: Ability to manage health-related needs will improve Outcome: Adequate for Discharge   Problem: Clinical Measurements: Goal: Ability to maintain clinical measurements within normal limits will improve Outcome: Adequate for Discharge Goal: Will remain free from infection Outcome: Adequate for Discharge Goal: Diagnostic test results will improve Outcome: Adequate for Discharge Goal: Respiratory complications will improve Outcome: Adequate for Discharge Goal: Cardiovascular complication will be avoided Outcome: Adequate for Discharge   Problem: Activity: Goal: Risk for activity intolerance will decrease Outcome: Adequate for Discharge   Problem: Nutrition: Goal: Adequate nutrition will be maintained Outcome: Adequate for Discharge   Problem: Coping: Goal: Level of anxiety will decrease Outcome: Adequate for Discharge   Problem: Elimination: Goal: Will not experience complications related to bowel motility Outcome: Adequate for Discharge Goal: Will not experience complications related to urinary retention Outcome: Adequate for Discharge   Problem: Pain Managment: Goal: General experience of comfort will improve Outcome: Adequate for Discharge   Problem: Safety: Goal: Ability to remain free from injury will improve Outcome: Adequate for Discharge   Problem: Skin Integrity: Goal: Risk for impaired skin integrity will decrease Outcome: Adequate for Discharge   Problem: Education: Goal: Will demonstrate proper wound care and an understanding of methods to prevent future damage Outcome: Adequate for Discharge Goal: Knowledge of disease or condition will improve Outcome: Adequate for Discharge Goal:  Knowledge of the prescribed therapeutic regimen will improve Outcome: Adequate for Discharge Goal: Individualized Educational Video(s) Outcome: Adequate for Discharge   Problem: Activity: Goal: Risk for activity intolerance will decrease Outcome: Adequate for Discharge   Problem: Cardiac: Goal: Will achieve and/or maintain hemodynamic stability Outcome: Adequate for Discharge   Problem: Clinical Measurements: Goal: Postoperative complications will be avoided or minimized Outcome: Adequate for Discharge   Problem: Respiratory: Goal: Respiratory status will improve Outcome: Adequate for Discharge   Problem: Skin Integrity: Goal: Wound healing without signs and symptoms of infection Outcome: Adequate for Discharge Goal: Risk for impaired skin integrity will decrease Outcome: Adequate for Discharge   Problem: Urinary Elimination: Goal: Ability to achieve and maintain adequate renal perfusion and functioning will improve Outcome: Adequate for Discharge   

## 2019-09-12 NOTE — Progress Notes (Signed)
CARDIAC REHAB PHASE I   Offered to walk with pt. Pt states she has been ambulating independently, HR noted to be 106 lying in the bed. Pt states HR gets to 130s with ambulating to restroom. Pt and husband educated on importance of site care and monitoring incision daily. Encouraged continued ambulation and IS use. Reviewed restrictions and exercise guidelines. Will refer to CRP II [facility]. Pt requesting walker and 3-in-1 for home use, CM made aware.  [phone] Reynold Bowen, RN BSN 09/12/2019 10:40 AM

## 2019-09-13 MED FILL — Sodium Bicarbonate IV Soln 8.4%: INTRAVENOUS | Qty: 50 | Status: AC

## 2019-09-13 MED FILL — Heparin Sodium (Porcine) Inj 1000 Unit/ML: INTRAMUSCULAR | Qty: 10 | Status: AC

## 2019-09-13 MED FILL — Electrolyte-R (PH 7.4) Solution: INTRAVENOUS | Qty: 6000 | Status: AC

## 2019-09-13 MED FILL — Mannitol IV Soln 20%: INTRAVENOUS | Qty: 500 | Status: AC

## 2019-09-13 MED FILL — Sodium Chloride IV Soln 0.9%: INTRAVENOUS | Qty: 2000 | Status: AC

## 2019-09-15 ENCOUNTER — Telehealth (HOSPITAL_COMMUNITY): Payer: Self-pay

## 2019-09-15 NOTE — Telephone Encounter (Signed)
Faxed referral for Phase II cardiac rehab to [facility]. °

## 2019-09-22 ENCOUNTER — Other Ambulatory Visit: Payer: Self-pay | Admitting: Thoracic Surgery (Cardiothoracic Vascular Surgery)

## 2019-09-22 DIAGNOSIS — Z953 Presence of xenogenic heart valve: Secondary | ICD-10-CM

## 2019-09-26 ENCOUNTER — Encounter: Payer: Self-pay | Admitting: Physician Assistant

## 2019-09-26 ENCOUNTER — Ambulatory Visit (INDEPENDENT_AMBULATORY_CARE_PROVIDER_SITE_OTHER): Payer: Self-pay | Admitting: Physician Assistant

## 2019-09-26 ENCOUNTER — Ambulatory Visit
Admission: RE | Admit: 2019-09-26 | Discharge: 2019-09-26 | Disposition: A | Discharge status: Home / Self Care | Payer: Medicare HMO | Source: Ambulatory Visit | Attending: Thoracic Surgery (Cardiothoracic Vascular Surgery) | Admitting: Thoracic Surgery (Cardiothoracic Vascular Surgery)

## 2019-09-26 ENCOUNTER — Other Ambulatory Visit: Payer: Self-pay

## 2019-09-26 VITALS — BP 134/84 | HR 101 | Temp 98.8°F | Resp 20 | Ht 63.0 in | Wt 177.0 lb

## 2019-09-26 DIAGNOSIS — Z953 Presence of xenogenic heart valve: Secondary | ICD-10-CM

## 2019-09-26 NOTE — Progress Notes (Signed)
[address]       [name] [address]             [phone]      CODA MISKO is a 59 y.o. female patient is s/p minimally invasive aortic valve replacement with a 21 mm [employer] Resilia Bovine Pericardial Tissue Valve by Dr. Cornelius Moras. She presents to the clinic today for her routine follow-up visit.    1. S/P minimally-invasive aortic valve replacement with bioprosthetic valve    Past Medical History:  Diagnosis Date  . Anxiety   . Aortic stenosis   . CHF (congestive heart failure) (HCC)   . COPD (chronic obstructive pulmonary disease) (HCC)   . Depression   . Dyspnea    on exertion and lying down  . GERD (gastroesophageal reflux disease)   . H/O blood clots   . Headache    migraines  . Heart murmur   . History of hiatal hernia   . Hyperlipidemia   . Hypertension   . Hypothyroidism   . Insomnia   . Painful orthopaedic hardware (HCC)   . Raynaud disease   . Rheumatoid arthritis (HCC)   . S/P minimally-invasive aortic valve replacement with bioprosthetic valve 09/07/2019   21 mm [employer] Resilia stented bovine pericardial tissue valve via right anterior mini thoracotomy approach  . Scleroderma (HCC)   . Sleep apnea    wears cpap  . Wears glasses   . Wears partial dentures    No past surgical history pertinent negatives on file. Scheduled Meds: Current Outpatient Medications on File Prior to Visit  Medication Sig Dispense Refill  . acetaminophen (TYLENOL) 500 MG tablet Take 2 tablets (1,000 mg total) by mouth every 6 (six) hours as needed for mild pain or fever. 30 tablet 0  . aspirin EC 325 MG EC tablet Take 1 tablet (325 mg total) by mouth daily. 30 tablet 0  . atorvastatin (LIPITOR) 40 MG tablet Take 40 mg by mouth daily.     . carboxymethylcellulose (REFRESH PLUS) 0.5 % SOLN Place 1 drop into both eyes daily.     . cholecalciferol (VITAMIN D) 25 MCG (1000 UNIT) tablet Take 1,000 Units by mouth 2 (two) times daily.     .  diclofenac (CATAFLAM) 50 MG tablet Take 50 mg by mouth 2 (two) times daily as needed (migraine).     . divalproex (DEPAKOTE ER) 500 MG 24 hr tablet Take 500 mg by mouth daily.    Marland Kitchen doxycycline (VIBRAMYCIN) 100 MG capsule Take 100 mg by mouth as needed (acne break outs).     . furosemide (LASIX) 40 MG tablet Take 1 tablet (40 mg total) by mouth daily. 7 tablet 0  . lansoprazole (PREVACID) 30 MG capsule Take 30 mg by mouth 2 (two) times daily.     Marland Kitchen levothyroxine (SYNTHROID) 75 MCG tablet Take 75 mcg by mouth daily before breakfast.     . Melatonin 5 MG CAPS Take 5 mg by mouth at bedtime as needed (for sleep).    . metoprolol succinate (TOPROL-XL) 50 MG 24 hr tablet Take 50 mg by mouth daily.     . Multiple Vitamin (MULTIVITAMIN WITH MINERALS) TABS tablet Take 1 tablet by mouth daily.    . NURTEC 75 MG TBDP Take 1 tablet by mouth daily as needed for headache.    . Omega-3 Fatty Acids (EQL OMEGA 3 FISH OIL) 1000 MG CAPS Take 1,000 mg by mouth 2 (two) times daily.     Marland Kitchen  potassium chloride 20 MEQ TBCR Take 20 mEq by mouth daily. 7 tablet 0  . sulfamethoxazole-trimethoprim (BACTRIM DS) 800-160 MG tablet Take 1 tablet by mouth 2 (two) times daily. 10 tablet 0  . traMADol (ULTRAM) 50 MG tablet Take 1 tablet (50 mg total) by mouth every 4 (four) hours as needed for moderate pain. 30 tablet 0  . tretinoin (RETIN-A) 0.05 % cream Apply 1 application topically daily as needed (Outbreak).     . vitamin B-12 (CYANOCOBALAMIN) 1000 MCG tablet Take 1,000 mcg by mouth daily.      No current facility-administered medications on file prior to visit.    Allergies  Allergen Reactions  . Morphine And Related Hives and Itching  . Gabapentin Itching and Other (See Comments)    Brain fog.  . Meperidine Itching   Active Problems:   * No active hospital problems. *  Blood pressure 134/84, pulse (!) 101, temperature 98.8 F (37.1 C), resp. rate 20, height 5\' 3"  (1.6 m), weight 177 lb (80.3 kg), SpO2 95 %.       Subjective Ms. Yott presents today for a follow-up visit s/p minimally invasive aortic valve replacement with a tissue valve. Overall, she is doing well.    Objective  Cor: RRR, no murmur Pulm: CTA bilaterally and in all fields Abd:no tenderness Wound: c/d/i, chest tube site is a little moist Ext: no edema    CLINICAL DATA:  Status post aortic valve replacement.  EXAM: CHEST - 2 VIEW  COMPARISON:  September 12, 2019.  FINDINGS: The heart size and mediastinal contours are within normal limits. Aortic valve prosthesis is noted. No pneumothorax or pleural effusion is noted. Both lungs are clear. The visualized skeletal structures are unremarkable.  IMPRESSION: No active cardiopulmonary disease.   Electronically Signed   By: September 14, 2019 M.D.   On: 09/26/2019 12:57  Assessment & Plan   Ms. Corliss is a 59 year old female patient who underwent minimally invasive aortic valve replacement with a tissue valve with Dr. 46.  She presents today for her routine follow-up appointment.  Overall she is doing very well.  She is feeling better each day and is able to do her walking around her house.  She has been hesitant to walk outside due to the temperature and humidity.  She has been doing her deep breathing exercises as well.  I reviewed her chest x-ray today with the patient and had no concerns.  She has had headaches more frequently since surgery.  She even complained that it kept her in the hospital longer than she had hoped.  She states that she has had a lot of sinus pressure and her nose has been stuffy.  She received an antibiotic and has been using saline to keep her nose clear.  She does feel like this is the reason for her headaches.  Otherwise, she has had no pain from her surgical site.  Since she is no longer requiring narcotic pain medicine I have cleared her to drive.  We discussed her diet which mostly consists of fruits and carbohydrates.  I explained that  she needed to add some protein for wound healing and energy.  We discussed yogurt, eggs, and Ensure supplementation to meet her protein needs.  She is encouraged to take part in a cardiac rehab program when appropriate.  She plans to continue to walk several times a day.  There were no medication changes made during this visit.  She is a bit tachycardic today however she  is on metoprolol XL 50 mg daily.  The only time that she notices her tachycardia is when she is exerting herself and sometimes feels like her heart is beating forcefully.  She has not had any other associated symptoms when she exerts herself.  Her tachycardia is likely related to deconditioning.  She is to follow-up with Dr. Cornelius Moras in 2 months and she has an appointment already made with cardiology next week.  If she has any further issues with her incisions or any other questions she can call our office and is welcome to come in before 2 months time.  Her one chest tube incision underneath her breast did seem a little more moist than the others however was healing okay.  There was no drainage coming from the incision and I suggested keeping it dry and clean with a dry 2 x 2 and tape.  All questions were answered to the patient and family member satisfaction.  Follow-up with Dr. Cornelius Moras in 2 months.      Sharlene Dory 09/26/2019

## 2019-09-26 NOTE — Patient Instructions (Signed)
You may return to driving an automobile as long as you are no longer requiring oral narcotic pain relievers during the daytime.  It would be wise to start driving only short distances during the daylight and gradually increase from there as you feel comfortable.  Make every effort to stay physically active, get some type of exercise on a regular basis, and stick to a "heart healthy diet".  The long term benefits for regular exercise and a healthy diet are critically important to your overall health and wellbeing.  You are encouraged to enroll and participate in the outpatient cardiac rehab program beginning as soon as practical.  Return in 2 months

## 2019-09-28 NOTE — Progress Notes (Signed)
HPI: Follow-up aortic stenosis/AVR. Cardiac catheterization May 2021 showed mild nonobstructive coronary disease, severe aortic stenosis and mildly elevated pulmonary pressure.  Preoperative carotid Dopplers June 2021 showed near normal carotids bilaterally.patient had minimally invasive aortic valve replacement with Ansel Bong Resilia stented bovine pericardial tissue valve June 2021.  Since last seen, she denies dyspnea, chest pain, palpitations or syncope.  She has noticed increased headaches since her previous surgery.  Current Outpatient Medications  Medication Sig Dispense Refill   acetaminophen (TYLENOL) 500 MG tablet Take 2 tablets (1,000 mg total) by mouth every 6 (six) hours as needed for mild pain or fever. 30 tablet 0   aspirin EC 325 MG EC tablet Take 1 tablet (325 mg total) by mouth daily. 30 tablet 0   atorvastatin (LIPITOR) 40 MG tablet Take 40 mg by mouth daily.      carboxymethylcellulose (REFRESH PLUS) 0.5 % SOLN Place 1 drop into both eyes daily.      cholecalciferol (VITAMIN D) 25 MCG (1000 UNIT) tablet Take 1,000 Units by mouth 2 (two) times daily.      diclofenac (CATAFLAM) 50 MG tablet Take 50 mg by mouth 2 (two) times daily as needed (migraine).      divalproex (DEPAKOTE ER) 500 MG 24 hr tablet Take 500 mg by mouth daily.     lansoprazole (PREVACID) 30 MG capsule Take 30 mg by mouth 2 (two) times daily.      levothyroxine (SYNTHROID) 75 MCG tablet Take 75 mcg by mouth daily before breakfast.      Melatonin 5 MG CAPS Take 5 mg by mouth at bedtime as needed (for sleep).     metoprolol succinate (TOPROL-XL) 50 MG 24 hr tablet Take 50 mg by mouth daily.      Multiple Vitamin (MULTIVITAMIN WITH MINERALS) TABS tablet Take 1 tablet by mouth daily.     NURTEC 75 MG TBDP Take 1 tablet by mouth daily as needed for headache.     Omega-3 Fatty Acids (EQL OMEGA 3 FISH OIL) 1000 MG CAPS Take 1,000 mg by mouth 2 (two) times daily.      potassium chloride 20  MEQ TBCR Take 20 mEq by mouth daily. 7 tablet 0   tretinoin (RETIN-A) 0.05 % cream Apply 1 application topically daily as needed (Outbreak).      vitamin B-12 (CYANOCOBALAMIN) 1000 MCG tablet Take 1,000 mcg by mouth daily.      No current facility-administered medications for this visit.     Past Medical History:  Diagnosis Date   Anxiety    Aortic stenosis    CHF (congestive heart failure) (HCC)    COPD (chronic obstructive pulmonary disease) (HCC)    Depression    Dyspnea    on exertion and lying down   GERD (gastroesophageal reflux disease)    H/O blood clots    Headache    migraines   Heart murmur    History of hiatal hernia    Hyperlipidemia    Hypertension    Hypothyroidism    Insomnia    Painful orthopaedic hardware (HCC)    Raynaud disease    Rheumatoid arthritis (HCC)    S/P minimally-invasive aortic valve replacement with bioprosthetic valve 09/07/2019   21 mm Edwards Inspiris Resilia stented bovine pericardial tissue valve via right anterior mini thoracotomy approach   Scleroderma (HCC)    Sleep apnea    wears cpap   Wears glasses    Wears partial dentures     Past  Surgical History:  Procedure Laterality Date   ABDOMINAL HYSTERECTOMY     TAH   AORTIC VALVE REPLACEMENT N/A 09/07/2019   Procedure: MINIMALLY INVASIVE AORTIC VALVE REPLACEMENT (AVR) using [employer] INSPIRIS Resilia 21 MM Aortic Valve.;  Surgeon: Purcell Nails, MD;  Location: [facility] OR;  Service: Open Heart Surgery;  Laterality: N/A;   BUNIONECTOMY     CARDIAC CATHETERIZATION N/A 04/24/2015   Procedure: Right/Left Heart Cath and Coronary Angiography;  Surgeon: Tonny Bollman, MD;  Location: [facility] INVASIVE CV LAB;  Service: Cardiovascular;  Laterality: N/A;   CHOLECYSTECTOMY     COLONOSCOPY WITH ESOPHAGOGASTRODUODENOSCOPY (EGD)     HARDWARE REMOVAL Right 01/20/2018   Procedure: Removal of deep implants right medial cuneiform, 1st metatarsal and 2nd metatarsal;   Surgeon: Toni Arthurs, MD;  Location: [facility] OR;  Service: Orthopedics;  Laterality: Right;    MULTIPLE TOOTH EXTRACTIONS     RIGHT/LEFT HEART CATH AND CORONARY ANGIOGRAPHY N/A 07/26/2019   Procedure: RIGHT/LEFT HEART CATH AND CORONARY ANGIOGRAPHY;  Surgeon: Tonny Bollman, MD;  Location: [facility] INVASIVE CV LAB;  Service: Cardiovascular;  Laterality: N/A;   sympathomyectomies  2000   Bilateral wrists   TEE WITHOUT CARDIOVERSION N/A 09/07/2019   Procedure: TRANSESOPHAGEAL ECHOCARDIOGRAM (TEE);  Surgeon: Purcell Nails, MD;  Location: [facility] OR;  Service: Open Heart Surgery;  Laterality: N/A;   THROMBECTOMY Left 1999   wrist    Social History   Socioeconomic History   Marital status: Legally Separated    Spouse name: Not on file   Number of children: Not on file   Years of education: Not on file   Highest education level: Not on file  Occupational History   Not on file  Tobacco Use   Smoking status: Never Smoker   Smokeless tobacco: Never Used  Vaping Use   Vaping Use: Never used  Substance and Sexual Activity   Alcohol use: No    Alcohol/week: 0.0 standard drinks   Drug use: No   Sexual activity: Not on file  Other Topics Concern   Not on file  Social History Narrative   Not on file   Social Determinants of Health   Financial Resource Strain:    Difficulty of Paying Living Expenses:   Food Insecurity:    Worried About Programme researcher, broadcasting/film/video in the Last Year:    Barista in the Last Year:   Transportation Needs:    Freight forwarder (Medical):    Lack of Transportation (Non-Medical):   Physical Activity:    Days of Exercise per Week:    Minutes of Exercise per Session:   Stress:    Feeling of Stress :   Social Connections:    Frequency of Communication with Friends and Family:    Frequency of Social Gatherings with Friends and Family:    Attends Religious Services:    Active Member of Clubs or Organizations:    Attends Museum/gallery exhibitions officer:    Marital Status:   Intimate Partner Violence:    Fear of Current or Ex-Partner:    Emotionally Abused:    Physically Abused:    Sexually Abused:     Family History  Adopted: Yes  Problem Relation Age of Onset   Heart attack Sister 88       deceased    ROS: HA but no fevers or chills, productive cough, hemoptysis, dysphasia, odynophagia, melena, hematochezia, dysuria, hematuria, rash, seizure activity, orthopnea, PND, pedal edema, claudication. Remaining systems are negative.  Physical Exam: Well-developed well-nourished in no acute distress.  Skin is warm and dry.  HEENT is normal.  Neck is supple.  Chest is clear to auscultation with normal expansion.  Previous surgical incision without evidence of infection. Cardiovascular exam is regular rate and rhythm.  Abdominal exam nontender or distended. No masses palpated. Extremities show no edema. neuro grossly intact  ECG-sinus rhythm at a rate of 78, no ST changes.  Personally reviewed  A/P  1 status post aortic valve replacement-patient educated on SBE prophylaxis.  We will plan a baseline echocardiogram status post AVR in September.  2 hypertension-patient's blood pressure is mildly elevated; however it has been controlled at home.  I have asked her to follow this.  We will resume amlodipine if needed.  3 nonobstructive coronary disease-continue aspirin and statin.  4 hyperlipidemia-continue statin.  5 history of scleroderma  Olga Millers, MD

## 2019-10-04 ENCOUNTER — Encounter: Payer: Self-pay | Admitting: Cardiology

## 2019-10-04 ENCOUNTER — Other Ambulatory Visit: Payer: Self-pay

## 2019-10-04 ENCOUNTER — Ambulatory Visit: Payer: Medicare HMO | Admitting: Cardiology

## 2019-10-04 VITALS — BP 144/94 | HR 78 | Ht 63.0 in | Wt 180.0 lb

## 2019-10-04 DIAGNOSIS — I359 Nonrheumatic aortic valve disorder, unspecified: Secondary | ICD-10-CM

## 2019-10-04 DIAGNOSIS — I10 Essential (primary) hypertension: Secondary | ICD-10-CM

## 2019-10-04 DIAGNOSIS — I2510 Atherosclerotic heart disease of native coronary artery without angina pectoris: Secondary | ICD-10-CM

## 2019-10-04 DIAGNOSIS — E7800 Pure hypercholesterolemia, unspecified: Secondary | ICD-10-CM

## 2019-10-04 NOTE — Patient Instructions (Signed)
Medication Instructions:  NO CHANGE *If you need a refill on your cardiac medications before your next appointment, please call your pharmacy*   Lab Work: If you have labs (blood work) drawn today and your tests are completely normal, you will receive your results only by: Marland Kitchen MyChart Message (if you have MyChart) OR . A paper copy in the mail If you have any lab test that is abnormal or we need to change your treatment, we will call you to review the results.   Testing/Procedures: Your physician has requested that you have an echocardiogram. Echocardiography is a painless test that uses sound waves to create images of your heart. It provides your doctor with information about the size and shape of your heart and how well your heart's chambers and valves are working. This procedure takes approximately one hour. There are no restrictions for this procedure.[place] [facility] -1 ST FLOOR IMAGING DEPARTMENT  Follow-Up: At [facility], you and your health needs are our priority.  As part of our continuing mission to provide you with exceptional heart care, we have created designated Provider Care Teams.  These Care Teams include your primary Cardiologist (physician) and Advanced Practice Providers (APPs -  Physician Assistants and Nurse Practitioners) who all work together to provide you with the care you need, when you need it.  We recommend signing up for the patient portal called "MyChart".  Sign up information is provided on this After Visit Summary.  MyChart is used to connect with patients for Virtual Visits (Telemedicine).  Patients are able to view lab/test results, encounter notes, upcoming appointments, etc.  Non-urgent messages can be sent to your provider as well.   To learn more about what you can do with MyChart, go to [email or web address].    Your next appointment:   6 month(s)  The format for your next appointment:   In Person  Provider:   Olga Millers, MD

## 2019-10-13 ENCOUNTER — Telehealth: Payer: Self-pay

## 2019-10-13 NOTE — Telephone Encounter (Signed)
Patient contacted the office concerned about having slight chest tightness with no other symptoms.  She stated that it started this week and she didn't know if she should be worried.  She is s/p AV Replacement with Dr. Cornelius Moras 09/07/19.  Upon questioning she has not been heavy lifting or strenuous exercise.  Per patient her vitals are normal when check and she has been taking her medication as directed.  She does not have a cough, shortness of breath, or and mucous.    Advised patient based on symptoms now, this does not seem to be too concerning.  However, if she develops more severe symptoms, she should call 911 or go to the nearest emergency room.  She acknowledged receipt and was thankful for the return call.

## 2019-10-30 ENCOUNTER — Telehealth: Payer: Self-pay | Admitting: Cardiology

## 2019-10-30 MED ORDER — AMLODIPINE BESYLATE 5 MG PO TABS: 5.0000 mg | ORAL_TABLET | Freq: Every day | ORAL | 3 refills | Status: DC

## 2019-10-30 NOTE — Telephone Encounter (Signed)
Spoke with pt, Aware of dr crenshaw's recommendations. New script sent to the pharmacy  

## 2019-10-30 NOTE — Telephone Encounter (Signed)
Spoke with patient who reports elevated BP from cardiac rehab - 164/104, 154/98  She reports headache on and off since surgery; Denies other symptoms  She also reports elevated BP at home 188/104 150/99 HR: 94, 81, 97, 92  She is taking metoprolol succinate 50mg  daily  Advised will send message to MD to advise on med changes if needed

## 2019-10-30 NOTE — Telephone Encounter (Signed)
Resume amlodipine 5 mg daily.  Follow blood pressure and we will increase if needed. Olga Millers

## 2019-10-30 NOTE — Telephone Encounter (Signed)
    Pt c/o BP issue: STAT if pt c/o blurred vision, one-sided weakness or slurred speech  1. What are your last 5 BP readings? 270/623,762/83  2. Are you having any other symptoms (ex. Dizziness, headache, blurred vision, passed out)? Headache  3. What is your BP issue? French Ana [facility], pt BP is elevated today and would like for Dr. Jens Som to be aware. She said to call pt

## 2019-10-30 NOTE — Telephone Encounter (Signed)
Left message to call back  

## 2019-10-30 NOTE — Telephone Encounter (Signed)
Pt returned call

## 2019-11-14 ENCOUNTER — Telehealth: Payer: Self-pay | Admitting: Cardiology

## 2019-11-14 ENCOUNTER — Encounter (HOSPITAL_COMMUNITY): Payer: Self-pay | Admitting: Emergency Medicine

## 2019-11-14 ENCOUNTER — Emergency Department (HOSPITAL_COMMUNITY): Payer: Medicare HMO

## 2019-11-14 ENCOUNTER — Emergency Department (HOSPITAL_COMMUNITY)
Admission: EM | Admit: 2019-11-14 | Discharge: 2019-11-14 | Disposition: A | Discharge status: Home / Self Care | Payer: Medicare HMO | Attending: Emergency Medicine | Admitting: Emergency Medicine

## 2019-11-14 ENCOUNTER — Other Ambulatory Visit: Payer: Self-pay

## 2019-11-14 DIAGNOSIS — R05 Cough: Secondary | ICD-10-CM | POA: Diagnosis not present

## 2019-11-14 DIAGNOSIS — M549 Dorsalgia, unspecified: Secondary | ICD-10-CM | POA: Insufficient documentation

## 2019-11-14 DIAGNOSIS — Z5321 Procedure and treatment not carried out due to patient leaving prior to being seen by health care provider: Secondary | ICD-10-CM | POA: Diagnosis not present

## 2019-11-14 DIAGNOSIS — R0602 Shortness of breath: Secondary | ICD-10-CM | POA: Insufficient documentation

## 2019-11-14 DIAGNOSIS — Z20822 Contact with and (suspected) exposure to covid-19: Secondary | ICD-10-CM | POA: Insufficient documentation

## 2019-11-14 DIAGNOSIS — R079 Chest pain, unspecified: Secondary | ICD-10-CM | POA: Diagnosis not present

## 2019-11-14 LAB — TROPONIN I (HIGH SENSITIVITY): Troponin I (High Sensitivity): 13 ng/L (ref <18)

## 2019-11-14 LAB — CBC
HCT: 44.3 % (ref 36.0–46.0)
Hemoglobin: 13.9 g/dL (ref 12.0–15.0)
MCH: 28.9 pg (ref 26.0–34.0)
MCHC: 31.4 g/dL (ref 30.0–36.0)
MCV: 92.1 fL (ref 80.0–100.0)
Platelets: 282 10*3/uL (ref 150–400)
RBC: 4.81 MIL/uL (ref 3.87–5.11)
RDW: 13.3 % (ref 11.5–15.5)
WBC: 10.2 10*3/uL (ref 4.0–10.5)
nRBC: 0 % (ref 0.0–0.2)

## 2019-11-14 LAB — BASIC METABOLIC PANEL
Anion gap: 10 (ref 5–15)
BUN: <5 mg/dL — ABNORMAL LOW (ref 6–20)
CO2: 26 mmol/L (ref 22–32)
Calcium: 10.2 mg/dL (ref 8.9–10.3)
Chloride: 106 mmol/L (ref 98–111)
Creatinine, Ser: 0.86 mg/dL (ref 0.44–1.00)
GFR calc Af Amer: >60 mL/min (ref >60)
GFR calc non Af Amer: >60 mL/min (ref >60)
Glucose, Bld: 101 mg/dL — ABNORMAL HIGH (ref 70–99)
Potassium: 3.9 mmol/L (ref 3.5–5.1)
Sodium: 142 mmol/L (ref 135–145)

## 2019-11-14 LAB — SARS CORONAVIRUS 2 BY RT PCR (HOSPITAL ORDER, PERFORMED IN ~~LOC~~ HOSPITAL LAB): SARS Coronavirus 2: NEGATIVE

## 2019-11-14 NOTE — Telephone Encounter (Signed)
Pt c/o of Chest Pain: STAT if CP now or developed within 24 hours  1. Are you having CP right now? yes  2. Are you experiencing any other symptoms (ex. SOB, nausea, vomiting, sweating)?   3. How long have you been experiencing CP? Since Friday  4. Is your CP continuous or coming and going? Comes and gone  5. Have you taken Nitroglycerin? No, she does not have ant ?

## 2019-11-14 NOTE — Telephone Encounter (Signed)
Received a call from patient she stated she has been having chest pain under left breast off and on since this past Friday.Stated hard to get a deep breath.She has a frequent dry cough.Stated pain is worse today.Advised to go to [facility] ED.Trish notified.

## 2019-11-14 NOTE — ED Notes (Signed)
Pt was called multiple and no answer, OTF

## 2019-11-14 NOTE — ED Triage Notes (Addendum)
Pt arrives to ED by POV with complaints of left sided chest pain and shortness of breath that started on Friday. Per pt the pain is achy and is constant that radiates to her back. Pt states that she developed a dry cough over the last couple of weeks.

## 2019-11-22 ENCOUNTER — Telehealth: Payer: Self-pay | Admitting: Cardiology

## 2019-11-22 MED ORDER — AMLODIPINE BESYLATE 10 MG PO TABS: 10.0000 mg | ORAL_TABLET | Freq: Every day | ORAL | 3 refills | Status: AC

## 2019-11-22 NOTE — Telephone Encounter (Signed)
     Pt c/o BP issue: STAT if pt c/o blurred vision, one-sided weakness or slurred speech  1. What are your last 5 BP readings? 196/110  2. Are you having any other symptoms (ex. Dizziness, headache, blurred vision, passed out)? Felt dizzy and started a headache  3. What is your BP issue? Pt said while exercising her BP went up to 196/110, she felt dizzy and started to get a headache she was told by the nurse to stop exercising and call Dr. Jens Som. She added she monitored her BP from 08/03 to today 09/01 and her bottom number is always ranging to 90-100

## 2019-11-22 NOTE — Telephone Encounter (Signed)
Spoke with pt, everyday at rehab her bp is high. 144/90, 172/96, and 154/98. It runs the same when she checks her blood pressure at home. She takes amlodipine and metoprolol both in the morning. The nurses at rehab told her to call. Will forward for dr Jens Som review

## 2019-11-22 NOTE — Telephone Encounter (Signed)
Increase amlodipine to 10 mg daily and follow blood pressure.  Natasha Taylor  

## 2019-11-22 NOTE — Telephone Encounter (Signed)
Spoke with pt, Aware of dr crenshaw's recommendations. New script sent to the pharmacy  

## 2019-12-04 ENCOUNTER — Encounter: Payer: Self-pay | Admitting: Thoracic Surgery (Cardiothoracic Vascular Surgery)

## 2019-12-04 ENCOUNTER — Other Ambulatory Visit: Payer: Self-pay

## 2019-12-04 ENCOUNTER — Ambulatory Visit (INDEPENDENT_AMBULATORY_CARE_PROVIDER_SITE_OTHER): Payer: Self-pay | Admitting: Thoracic Surgery (Cardiothoracic Vascular Surgery)

## 2019-12-04 VITALS — BP 150/90 | HR 92 | Temp 97.7°F | Resp 20 | Ht 63.0 in | Wt 175.0 lb

## 2019-12-04 DIAGNOSIS — I350 Nonrheumatic aortic (valve) stenosis: Secondary | ICD-10-CM

## 2019-12-04 DIAGNOSIS — Z953 Presence of xenogenic heart valve: Secondary | ICD-10-CM

## 2019-12-04 NOTE — Progress Notes (Signed)
[address]       [name] [address]             [phone]     CARDIOTHORACIC SURGERY OFFICE NOTE  Referring Provider is Lewayne Bunting, MD PCP is Drosinis, Leonia Reader, PA-C   HPI:  Patient is a 59 year old obese female with hypertension, hyperlipidemia, GE reflux disease, scleroderma, rheumatoid arthritis, and obstructive sleep apnea on CPAP who returns to the office today for routine follow-up status post minimally invasive aortic valve replacement using a bioprosthetic tissue valve on September 07, 2019 for severe symptomatic aortic stenosis.  Patient's early postoperative recovery was uneventful and she was last seen here in our office on September 26, 2019 at which time she was doing well.  Shortly after that she was seen in follow-up by Dr. Jens Som.  Her dose of amlodipine was increased back to preoperative baseline at that time.  Since then the patient has been actively participating in the cardiac rehab program in [facility].  She returns her office today and reports that she is doing very well.  She no longer has any significant pain in her chest.  She states that her breathing is considerably better than it was prior to surgery.  She is concerned that her blood pressure remains slightly elevated, but overall she is very pleased with her progress.  Routine postoperative echocardiogram has been ordered but not yet completed.  Current Outpatient Medications  Medication Sig Dispense Refill  . acetaminophen (TYLENOL) 500 MG tablet Take 2 tablets (1,000 mg total) by mouth every 6 (six) hours as needed for mild pain or fever. 30 tablet 0  . amLODipine (NORVASC) 10 MG tablet Take 1 tablet (10 mg total) by mouth daily. 90 tablet 3  . aspirin EC 325 MG EC tablet Take 1 tablet (325 mg total) by mouth daily. 30 tablet 0  . atorvastatin (LIPITOR) 40 MG tablet Take 40 mg by mouth daily.     . carboxymethylcellulose (REFRESH PLUS) 0.5 % SOLN Place 1 drop into both eyes daily.       . cholecalciferol (VITAMIN D) 25 MCG (1000 UNIT) tablet Take 1,000 Units by mouth 2 (two) times daily.     . diclofenac (CATAFLAM) 50 MG tablet Take 50 mg by mouth 2 (two) times daily as needed (migraine).     . divalproex (DEPAKOTE ER) 500 MG 24 hr tablet Take 500 mg by mouth daily.    . lansoprazole (PREVACID) 30 MG capsule Take 30 mg by mouth 2 (two) times daily.     Marland Kitchen levothyroxine (SYNTHROID) 75 MCG tablet Take 75 mcg by mouth daily before breakfast.     . Melatonin 5 MG CAPS Take 5 mg by mouth at bedtime as needed (for sleep).    . metoprolol succinate (TOPROL-XL) 50 MG 24 hr tablet Take 50 mg by mouth daily.     . Multiple Vitamin (MULTIVITAMIN WITH MINERALS) TABS tablet Take 1 tablet by mouth daily.    . NURTEC 75 MG TBDP Take 1 tablet by mouth daily as needed for headache.    . Omega-3 Fatty Acids (EQL OMEGA 3 FISH OIL) 1000 MG CAPS Take 1,000 mg by mouth 2 (two) times daily.     . potassium chloride 20 MEQ TBCR Take 20 mEq by mouth daily. 7 tablet 0  . tretinoin (RETIN-A) 0.05 % cream Apply 1 application topically daily as needed (Outbreak).     . vitamin B-12 (CYANOCOBALAMIN) 1000 MCG tablet  Take 1,000 mcg by mouth daily.      No current facility-administered medications for this visit.      Physical Exam:   BP (!) 150/90   Pulse 92   Temp 97.7 F (36.5 C) (Skin)   Resp 20   Ht 5\' 3"  (1.6 m)   Wt 175 lb (79.4 kg)   SpO2 97% Comment: RA  BMI 31.00 kg/m   General:  Well-appearing  Chest:   Clear to auscultation  CV:   Regular rate and rhythm without murmur  Incisions:  Completely healed  Abdomen:  Soft nontender  Extremities:  Warm and well-perfused  Diagnostic Tests:  n/a   Impression:  Patient is doing very well nearly 3 months status post aortic valve replacement using a bioprosthetic tissue valve  Plan:  We have not recommended any change the patient's current medications.  However, I have suggested that the patient continue to check her blood pressure  regularly and discuss with Dr. nurse whether or not her current blood pressure medication should be adjusted.  I have reminded the patient that she may continue to increase her physical activity without any particular limitations.  The patient has been reminded regarding the importance of dental hygiene and the lifelong need for antibiotic prophylaxis for all dental cleanings and other related invasive procedures.  The patient will continue to follow-up intermittently with Dr. Ludwig Clarks and return to our office for routine follow-up next summer, approximately 1 year following her surgery.  She will call and return sooner should specific problems or questions arise.    [id number]. Salvatore Decent, MD 12/04/2019 1:40 PM

## 2019-12-04 NOTE — Patient Instructions (Addendum)
Continue all previous medications without any changes at this time  Check your blood pressure on a regular basis and keep a log for your records.  Discussed with your cardiologist and/or primary care physician whether or not your blood pressure medications should be adjusted.  You may resume unrestricted physical activity without any particular limitations at this time.  Endocarditis is a potentially serious infection of heart valves or inside lining of the heart.  It occurs more commonly in patients with diseased heart valves (such as patient's with aortic or mitral valve disease) and in patients who have undergone heart valve repair or replacement.  Certain surgical and dental procedures may put you at risk, such as dental cleaning, other dental procedures, or any surgery involving the respiratory, urinary, gastrointestinal tract, gallbladder or prostate gland.   To minimize your chances for develooping endocarditis, maintain good oral health and seek prompt medical attention for any infections involving the mouth, teeth, gums, skin or urinary tract.    Always notify your doctor or dentist about your underlying heart valve condition before having any invasive procedures. You will need to take antibiotics before certain procedures, including all routine dental cleanings or other dental procedures.  Your cardiologist or dentist should prescribe these antibiotics for you to be taken ahead of time.      

## 2019-12-06 ENCOUNTER — Ambulatory Visit (HOSPITAL_BASED_OUTPATIENT_CLINIC_OR_DEPARTMENT_OTHER)
Admission: RE | Admit: 2019-12-06 | Discharge: 2019-12-06 | Disposition: A | Discharge status: Home / Self Care | Payer: Medicare HMO | Source: Ambulatory Visit | Attending: Cardiology | Admitting: Cardiology

## 2019-12-06 ENCOUNTER — Other Ambulatory Visit: Payer: Self-pay

## 2019-12-06 DIAGNOSIS — I359 Nonrheumatic aortic valve disorder, unspecified: Secondary | ICD-10-CM | POA: Diagnosis present

## 2019-12-06 LAB — ECHOCARDIOGRAM COMPLETE
AR max vel: 1.4 cm2
AV Area VTI: 1.56 cm2
AV Area mean vel: 1.36 cm2
AV Mean grad: 7.7 mmHg
AV Peak grad: 13.8 mmHg
Ao pk vel: 1.86 m/s
Area-P 1/2: 2.73 cm2
S' Lateral: 2.03 cm

## 2020-02-27 ENCOUNTER — Other Ambulatory Visit (HOSPITAL_COMMUNITY): Payer: Medicare HMO

## 2020-02-28 ENCOUNTER — Other Ambulatory Visit: Payer: Self-pay | Admitting: *Deleted

## 2020-02-28 DIAGNOSIS — R0600 Dyspnea, unspecified: Secondary | ICD-10-CM

## 2020-03-27 ENCOUNTER — Ambulatory Visit: Payer: Medicare HMO | Admitting: Internal Medicine

## 2020-03-27 ENCOUNTER — Other Ambulatory Visit: Payer: Self-pay

## 2020-03-27 DIAGNOSIS — R0600 Dyspnea, unspecified: Secondary | ICD-10-CM

## 2020-03-27 LAB — PULMONARY FUNCTION TEST
DL/VA % pred: 82 %
DL/VA: 3.56 ml/min/mmHg/L
DLCO cor % pred: 90 %
DLCO cor: 16.5 ml/min/mmHg
DLCO unc % pred: 90 %
DLCO unc: 16.5 ml/min/mmHg
FEF 25-75 Post: 2.3 L/sec
FEF 25-75 Pre: 2.19 L/sec
FEF2575-%Change-Post: 5 %
FEF2575-%Pred-Post: 103 %
FEF2575-%Pred-Pre: 98 %
FEV1-%Change-Post: 2 %
FEV1-%Pred-Post: 97 %
FEV1-%Pred-Pre: 95 %
FEV1-Post: 2.25 L
FEV1-Pre: 2.2 L
FEV1FVC-%Change-Post: 3 %
FEV1FVC-%Pred-Pre: 102 %
FEV6-%Change-Post: 0 %
FEV6-%Pred-Post: 95 %
FEV6-%Pred-Pre: 96 %
FEV6-Post: 2.74 L
FEV6-Pre: 2.76 L
FEV6FVC-%Pred-Post: 103 %
FEV6FVC-%Pred-Pre: 103 %
FVC-%Change-Post: 0 %
FVC-%Pred-Post: 92 %
FVC-%Pred-Pre: 92 %
FVC-Post: 2.74 L
FVC-Pre: 2.76 L
Post FEV1/FVC ratio: 82 %
Post FEV6/FVC ratio: 100 %
Pre FEV1/FVC ratio: 80 %
Pre FEV6/FVC Ratio: 100 %
RV % pred: 107 %
RV: 1.95 L
TLC % pred: 107 %
TLC: 4.94 L

## 2020-04-10 ENCOUNTER — Telehealth: Payer: Self-pay | Admitting: Internal Medicine

## 2020-04-10 NOTE — Telephone Encounter (Signed)
Patient would like results of PFTs done on 03/27/20 .

## 2020-04-10 NOTE — Telephone Encounter (Signed)
They are normal  - pulmonary f/u is prn

## 2020-04-10 NOTE — Telephone Encounter (Signed)
Called and spoke with patient, advised of results of PFT and recommendations per Dr. Sherene Sires.  She verbalized understanding.  Nothing further needed.

## 2020-05-03 NOTE — Progress Notes (Signed)
HPI: Follow-up aortic stenosis/AVR. Cardiac catheterization May 2021 showed mild nonobstructive coronary disease, severe aortic stenosis and mildly elevated pulmonary pressure.  Preoperative carotid Dopplers June 2021 showed near normal carotids bilaterally. Patient had minimally invasive aortic valve replacement with Ansel Bong Resilia stented bovine pericardial tissue valve June 2021. Echocardiogram repeated September 2021 and showed ejection fraction 60 to 65%, grade 1 diastolic dysfunction, bioprosthetic aortic valve with normal function with mean gradient 8 mmHg and no aortic insufficiency.  Since last seen,  she denies dyspnea, chest pain or syncope.  Occasional skipped beat but no sustained palpitations.  Current Outpatient Medications  Medication Sig Dispense Refill  . acetaminophen (TYLENOL) 500 MG tablet Take 2 tablets (1,000 mg total) by mouth every 6 (six) hours as needed for mild pain or fever. 30 tablet 0  . amLODipine (NORVASC) 10 MG tablet Take 1 tablet (10 mg total) by mouth daily. 90 tablet 3  . aspirin EC 325 MG EC tablet Take 1 tablet (325 mg total) by mouth daily. 30 tablet 0  . atorvastatin (LIPITOR) 40 MG tablet Take 40 mg by mouth daily.     . carboxymethylcellulose (REFRESH PLUS) 0.5 % SOLN Place 1 drop into both eyes daily.     . cholecalciferol (VITAMIN D) 25 MCG (1000 UNIT) tablet Take 1,000 Units by mouth 2 (two) times daily.     . diclofenac (CATAFLAM) 50 MG tablet Take 50 mg by mouth 2 (two) times daily as needed (migraine).     . divalproex (DEPAKOTE ER) 500 MG 24 hr tablet Take 500 mg by mouth daily.    . lansoprazole (PREVACID) 30 MG capsule Take 30 mg by mouth 2 (two) times daily.     Marland Kitchen levothyroxine (SYNTHROID) 75 MCG tablet Take 75 mcg by mouth daily before breakfast.     . Melatonin 5 MG CAPS Take 5 mg by mouth at bedtime as needed (for sleep).    . metoprolol succinate (TOPROL-XL) 50 MG 24 hr tablet Take 50 mg by mouth daily.     . Multiple  Vitamin (MULTIVITAMIN WITH MINERALS) TABS tablet Take 1 tablet by mouth daily.    . NURTEC 75 MG TBDP Take 1 tablet by mouth daily as needed for headache.    . Omega-3 Fatty Acids (EQL OMEGA 3 FISH OIL) 1000 MG CAPS Take 1,000 mg by mouth 2 (two) times daily.     Marland Kitchen tretinoin (RETIN-A) 0.05 % cream Apply 1 application topically daily as needed (Outbreak).     . vitamin B-12 (CYANOCOBALAMIN) 1000 MCG tablet Take 1,000 mcg by mouth daily.      No current facility-administered medications for this visit.     Past Medical History:  Diagnosis Date  . Anxiety   . Aortic stenosis   . CHF (congestive heart failure) (HCC)   . COPD (chronic obstructive pulmonary disease) (HCC)   . Depression   . Dyspnea    on exertion and lying down  . GERD (gastroesophageal reflux disease)   . H/O blood clots   . Headache    migraines  . Heart murmur   . History of hiatal hernia   . Hyperlipidemia   . Hypertension   . Hypothyroidism   . Insomnia   . Painful orthopaedic hardware (HCC)   . Raynaud disease   . Rheumatoid arthritis (HCC)   . 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 Scleroderma (HCC)   . Sleep apnea    wears cpap  . Wears glasses   . Wears partial dentures     Past Surgical History:  Procedure Laterality Date  . ABDOMINAL HYSTERECTOMY     TAH  . AORTIC VALVE REPLACEMENT N/A 09/07/2019   Procedure: MINIMALLY INVASIVE AORTIC VALVE REPLACEMENT (AVR) using [employer] INSPIRIS Resilia 21 MM Aortic Valve.;  Surgeon: Purcell Nails, MD;  Location: [facility] OR;  Service: Open Heart Surgery;  Laterality: N/A;  . BUNIONECTOMY    . CARDIAC CATHETERIZATION N/A 04/24/2015   Procedure: Right/Left Heart Cath and Coronary Angiography;  Surgeon: Tonny Bollman, MD;  Location: [facility] INVASIVE CV LAB;  Service: Cardiovascular;  Laterality:  N/A;  . CHOLECYSTECTOMY    . COLONOSCOPY WITH ESOPHAGOGASTRODUODENOSCOPY (EGD)    . HARDWARE REMOVAL Right 01/20/2018   Procedure: Removal of deep implants right medial cuneiform, 1st metatarsal and 2nd metatarsal;  Surgeon: Toni Arthurs, MD;  Location: [facility] OR;  Service: Orthopedics;  Laterality: Right;   . MULTIPLE TOOTH EXTRACTIONS    . RIGHT/LEFT HEART CATH AND CORONARY ANGIOGRAPHY N/A 07/26/2019   Procedure: RIGHT/LEFT HEART CATH AND CORONARY ANGIOGRAPHY;  Surgeon: Tonny Bollman, MD;  Location: [facility] INVASIVE CV LAB;  Service: Cardiovascular;  Laterality: N/A;  . sympathomyectomies  2000   Bilateral wrists  . TEE WITHOUT CARDIOVERSION N/A 09/07/2019   Procedure: TRANSESOPHAGEAL ECHOCARDIOGRAM (TEE);  Surgeon: Purcell Nails, MD;  Location: [facility] OR;  Service: Open Heart Surgery;  Laterality: N/A;  . THROMBECTOMY Left 1999   wrist    Social History   Socioeconomic History  . Marital status: Legally Separated    Spouse name: Not on file  . Number of children: Not on file  . Years of education: Not on file  . Highest education level: Not on file  Occupational History  . Not on file  Tobacco Use  . Smoking status: Never Smoker  . Smokeless tobacco: Never Used  Vaping Use  . Vaping Use: Never used  Substance and Sexual Activity  . Alcohol use: No    Alcohol/week: 0.0 standard drinks  . Drug use: No  . Sexual activity: Not on file  Other Topics Concern  . Not on file  Social History Narrative  . Not on file   Social Determinants of Health   Financial Resource Strain: Not on file  Food Insecurity: Not on file  Transportation Needs: Not on file  Physical Activity: Not on file  Stress: Not on file  Social Connections: Not on file  Intimate Partner Violence: Not on file    Family History  Adopted: Yes  Problem Relation Age of Onset  . Heart attack Sister 66       deceased    ROS: no fevers or chills, productive cough, hemoptysis, dysphasia, odynophagia, melena,  hematochezia, dysuria, hematuria, rash, seizure activity, orthopnea, PND, pedal edema, claudication. Remaining systems are negative.  Physical Exam: Well-developed well-nourished in no acute distress.  Skin is warm and dry.  HEENT is normal.  Neck is supple.  Chest is clear to auscultation with normal expansion.  Cardiovascular exam is regular rate and rhythm.  2/6 systolic murmur left sternal border.  No diastolic murmur. Abdominal exam nontender or distended. No masses palpated. Extremities show no edema. neuro grossly intact  A/P  1 status post aortic valve replacement-most recent echocardiogram showed normally functioning aortic valve.  Continue SBE prophylaxis.  2 hypertension-blood pressure controlled.  Continue present medical regimen.  3 hyperlipidemia-continue statin.  4 history of nonobstructive coronary disease-continue medical therapy with aspirin and statin.  She is not having chest pain.  5 history of scleroderma  Olga Millers, MD

## 2020-05-15 ENCOUNTER — Encounter: Payer: Self-pay | Admitting: Cardiology

## 2020-05-15 ENCOUNTER — Ambulatory Visit: Payer: Medicare HMO | Admitting: Cardiology

## 2020-05-15 ENCOUNTER — Other Ambulatory Visit: Payer: Self-pay

## 2020-05-15 VITALS — BP 138/88 | HR 76 | Ht 63.0 in | Wt 181.1 lb

## 2020-05-15 DIAGNOSIS — I10 Essential (primary) hypertension: Secondary | ICD-10-CM

## 2020-05-15 DIAGNOSIS — I359 Nonrheumatic aortic valve disorder, unspecified: Secondary | ICD-10-CM | POA: Diagnosis not present

## 2020-05-15 DIAGNOSIS — I2510 Atherosclerotic heart disease of native coronary artery without angina pectoris: Secondary | ICD-10-CM

## 2020-05-15 DIAGNOSIS — E7800 Pure hypercholesterolemia, unspecified: Secondary | ICD-10-CM

## 2020-05-15 NOTE — Patient Instructions (Signed)

## 2020-09-02 ENCOUNTER — Ambulatory Visit: Payer: Medicare HMO | Admitting: Thoracic Surgery (Cardiothoracic Vascular Surgery)

## 2020-09-09 ENCOUNTER — Other Ambulatory Visit: Payer: Self-pay

## 2020-09-09 ENCOUNTER — Telehealth: Payer: Self-pay | Admitting: Cardiology

## 2020-09-09 ENCOUNTER — Encounter: Payer: Self-pay | Admitting: Thoracic Surgery (Cardiothoracic Vascular Surgery)

## 2020-09-09 ENCOUNTER — Telehealth: Payer: Medicare HMO | Admitting: Thoracic Surgery (Cardiothoracic Vascular Surgery)

## 2020-09-09 ENCOUNTER — Telehealth (INDEPENDENT_AMBULATORY_CARE_PROVIDER_SITE_OTHER): Payer: Medicare HMO | Admitting: Thoracic Surgery (Cardiothoracic Vascular Surgery)

## 2020-09-09 DIAGNOSIS — Z953 Presence of xenogenic heart valve: Secondary | ICD-10-CM

## 2020-09-09 NOTE — Telephone Encounter (Signed)
Patient wanted to know if she needs to have regular Echos. She states she had heart surgery last year and wanted to double check. Please advise

## 2020-09-09 NOTE — Telephone Encounter (Signed)
Spoke with pt, Aware of dr crenshaw's recommendations.  °

## 2020-09-09 NOTE — Progress Notes (Addendum)
[address]       [name] [address]             [phone]     CARDIOTHORACIC SURGERY TELEPHONE VIRTUAL OFFICE NOTE  Referring Provider is Jens Som, Madolyn Frieze, MD Primary Cardiologist is Olga Millers, MD PCP is Drosinis, Leonia Reader, PA-C   HPI:  I spoke with Natasha Taylor (DOB January 15, 1961 ) via telephone on 09/09/2020 at 12:15 PM and verified that I was speaking with the correct person using more than one form of identification.  We discussed the fact that I was contacting them from my office and they were located at home, as well as the reason(s) for conducting our visit virtually instead of in-person.  The patient expressed understanding the circumstances and agreed to proceed as described.   Patient is a 60 year old obese female with hypertension, hyperlipidemia, GE reflux disease, scleroderma, rheumatoid arthritis, and obstructive sleep apnea on CPAP who I spoke with today for routine follow-up status post minimally invasive aortic valve replacement using a bioprosthetic tissue valve on September 07, 2019 for severe symptomatic aortic stenosis.  Patient's early postoperative recovery was uneventful and she was last seen here in our office on 12/04/2019 at which time she was doing well.  I spoke with the patient over the telephone today to check and see how she is doing now approximately 1 year following her surgery.  She reports that she is doing exceptionally well.  She has not had any problems at all with related to her heart surgery.  She states that she feels well and her exercise tolerance is much better than it was prior to surgery.  She states that prior to surgery she felt tired all the time and now she has terrific energy.  She specifically denies any exertional shortness of breath.  She does not have any pain related to her surgical incision.  She is delighted with her outcome.   Current Outpatient Medications  Medication Sig Dispense Refill   acetaminophen  (TYLENOL) 500 MG tablet Take 2 tablets (1,000 mg total) by mouth every 6 (six) hours as needed for mild pain or fever. 30 tablet 0   amLODipine (NORVASC) 10 MG tablet Take 1 tablet (10 mg total) by mouth daily. 90 tablet 3   aspirin EC 325 MG EC tablet Take 1 tablet (325 mg total) by mouth daily. 30 tablet 0   atorvastatin (LIPITOR) 40 MG tablet Take 40 mg by mouth daily.      carboxymethylcellulose (REFRESH PLUS) 0.5 % SOLN Place 1 drop into both eyes daily.      cholecalciferol (VITAMIN D) 25 MCG (1000 UNIT) tablet Take 1,000 Units by mouth 2 (two) times daily.      diclofenac (CATAFLAM) 50 MG tablet Take 50 mg by mouth 2 (two) times daily as needed (migraine).      divalproex (DEPAKOTE ER) 500 MG 24 hr tablet Take 500 mg by mouth daily.     lansoprazole (PREVACID) 30 MG capsule Take 30 mg by mouth 2 (two) times daily.      levothyroxine (SYNTHROID) 75 MCG tablet Take 75 mcg by mouth daily before breakfast.      Melatonin 5 MG CAPS Take 5 mg by mouth at bedtime as needed (for sleep).     metoprolol succinate (TOPROL-XL) 50 MG 24 hr tablet Take 50 mg by mouth daily.      Multiple Vitamin (MULTIVITAMIN WITH MINERALS) TABS tablet Take 1 tablet by mouth daily.  NURTEC 75 MG TBDP Take 1 tablet by mouth daily as needed for headache.     Omega-3 Fatty Acids (EQL OMEGA 3 FISH OIL) 1000 MG CAPS Take 1,000 mg by mouth 2 (two) times daily.      tretinoin (RETIN-A) 0.05 % cream Apply 1 application topically daily as needed (Outbreak).      vitamin B-12 (CYANOCOBALAMIN) 1000 MCG tablet Take 1,000 mcg by mouth daily.      No current facility-administered medications for this visit.     Diagnostic Tests:    ECHOCARDIOGRAM REPORT         Patient Name:   Natasha Taylor Date of Exam: 12/06/2019  Medical Rec #:  [id number]        Height:       63.0 in  Accession #:    [id number]       Weight:       175.0 lb  Date of Birth:  02-25-1961         BSA:          1.827 m  Patient Age:    60 years          BP:           131/85 mmHg  Patient Gender: F                HR:           93 bpm.  Exam Location:  [place]   Procedure: 2D Echo, Cardiac Doppler and Color Doppler   Indications:    S/P AVR-14mm Inspiris Resilia 08/2019     History:        Patient has prior history of Echocardiogram examinations,  most                  recent 09/07/2019. Scleroderma, Aortic Valve Disease; Risk                  Factors:Hypertension, Non-Smoker, Dyslipidemia and Sleep  Apnea.     Sonographer:    Joaquim Nam  Referring Phys: 27 BRIAN S CRENSHAW   IMPRESSIONS     1. Left ventricular ejection fraction, by estimation, is 60 to 65%. The  left ventricle has normal function. The left ventricle has no regional  wall motion abnormalities. Left ventricular diastolic parameters are  consistent with Grade I diastolic  dysfunction (impaired relaxation).   2. Bioprosthetic valve with normal function.   FINDINGS   Left Ventricle: Left ventricular ejection fraction, by estimation, is 60  to 65%. The left ventricle has normal function. The left ventricle has no  regional wall motion abnormalities. The left ventricular internal cavity  size was normal in size. There is   no left ventricular hypertrophy. Left ventricular diastolic parameters  are consistent with Grade I diastolic dysfunction (impaired relaxation).   Right Ventricle: The right ventricular size is normal. No increase in  right ventricular wall thickness. Right ventricular systolic function is  normal.   Left Atrium: Left atrial size was normal in size.   Right Atrium: Right atrial size was normal in size.   Pericardium: There is no evidence of pericardial effusion.   Mitral Valve: The mitral valve is normal in structure. No evidence of  mitral valve regurgitation. No evidence of mitral valve stenosis.   Tricuspid Valve: The tricuspid valve is normal in structure. Tricuspid  valve regurgitation is trivial. No evidence of tricuspid stenosis.    Aortic Valve: The aortic valve was not well visualized.  Aortic valve  regurgitation is not visualized. No aortic stenosis is present. Aortic  valve mean gradient measures 7.7 mmHg. Aortic valve peak gradient measures  13.8 mmHg. Aortic valve area, by VTI  measures 1.56 cm. There is a 21 mm Inspiris valve present in the aortic  position.   Pulmonic Valve: The pulmonic valve was normal in structure. Pulmonic valve  regurgitation is not visualized. No evidence of pulmonic stenosis.   Aorta: The aortic root is normal in size and structure.   Venous: The inferior vena cava is normal in size with greater than 50%  respiratory variability, suggesting right atrial pressure of 3 mmHg.   IAS/Shunts: No atrial level shunt detected by color flow Doppler.      LEFT VENTRICLE  PLAX 2D  LVIDd:         3.45 cm  Diastology  LVIDs:         2.03 cm  LV e' medial:    4.35 cm/s  LV PW:         1.10 cm  LV E/e' medial:  13.8  LV IVS:        1.72 cm  LV e' lateral:   6.85 cm/s  LVOT diam:     1.80 cm  LV E/e' lateral: 8.8  LV SV:         56  LV SV Index:   31  LVOT Area:     2.54 cm      RIGHT VENTRICLE  RV S prime:     12.00 cm/s   LEFT ATRIUM             Index       RIGHT ATRIUM           Index  LA diam:        4.10 cm 2.24 cm/m  RA Area:     12.70 cm  LA Vol (A2C):   16.8 ml 9.20 ml/m  RA Volume:   22.60 ml  12.37 ml/m  LA Vol (A4C):   30.8 ml 16.86 ml/m  LA Biplane Vol: 23.2 ml 12.70 ml/m   AORTIC VALVE  AV Area (Vmax):    1.40 cm  AV Area (Vmean):   1.36 cm  AV Area (VTI):     1.56 cm  AV Vmax:           186.00 cm/s  AV Vmean:          127.000 cm/s  AV VTI:            0.357 m  AV Peak Grad:      13.8 mmHg  AV Mean Grad:      7.7 mmHg  LVOT Vmax:         102.00 cm/s  LVOT Vmean:        68.100 cm/s  LVOT VTI:          0.219 m  LVOT/AV VTI ratio: 0.61     AORTA  Ao Root diam: 2.20 cm  Ao Asc diam:  2.40 cm   MITRAL VALVE  MV Area (PHT): 2.73 cm    SHUNTS  MV Decel  Time: 278 msec    Systemic VTI:  0.22 m  MV E velocity: 60.00 cm/s  Systemic Diam: 1.80 cm  MV A velocity: 87.00 cm/s  MV E/A ratio:  0.69   Belva Crome MD  Electronically signed by Belva Crome MD  Signature Date/Time: 12/06/2019/3:53:22 PM    Impression:  Patient is doing very  well approximately 1 year status post minimally invasive aortic valve replacement using stented bioprosthetic tissue valve.  Plan:  In the future the patient will call this office only should specific problems or questions arise.  She will continue to follow-up intermittently with her cardiologist.  We discussed current guidelines regarding long-term follow-up for all patients after bioprosthetic aortic valve replacement.  The patient has been reminded regarding the importance of dental hygiene and the lifelong need for antibiotic prophylaxis for all dental cleanings and other related invasive procedures.  All questions have been answered.     I discussed limitations of evaluation and management via telephone.  The patient was advised to call back for repeat telephone consultation or to seek an in-person evaluation if questions arise or the patient's clinical condition changes in any significant manner.  I spent in excess of 5 minutes of non-face-to-face time during the conduct of this telephone virtual office consultation, including pre-visit review of the patient's records and direct conversation with the patient.      Salvatore Decent. Cornelius Moras, MD 09/09/2020 12:15 PM

## 2020-09-09 NOTE — Patient Instructions (Signed)

## 2021-01-07 ENCOUNTER — Telehealth: Payer: Self-pay | Admitting: Cardiology

## 2021-01-07 NOTE — Telephone Encounter (Signed)
Pts rhemotologist says that he heard a murmur when checking the pts heart.. results sent to [facility] and came back as a fluctuation in the atrium and she needs to follow up with her cardiologist.

## 2021-01-07 NOTE — Telephone Encounter (Signed)
Spoke with Natasha Taylor, dr Casimer Lanius recently saw the patient and did an echo due to a murmur. She reports they told her there was an abnormality with the atrium and she needed to be seen. Left message for that office to fax the results of the echo to [place]. Aware will be back in touch once received.

## 2021-01-09 NOTE — Telephone Encounter (Signed)
Spoke with pt, echo from [facility] reviewed by dr Jens Som and it shows the right atrial cavity is borderline dilated. No change in therapy at this time. Follow up scheduled per recall.

## 2021-01-20 ENCOUNTER — Ambulatory Visit: Payer: Medicare HMO | Admitting: Physician Assistant

## 2021-04-30 IMAGING — CR DG CHEST 2V
2 series · 2 of 2 positions shown · non-contrast
Comparison: January 21, 2015.

CLINICAL DATA: Operative aortic valve replacement.  Hypertension.

EXAM:
CHEST - 2 VIEW

[w chest pa]
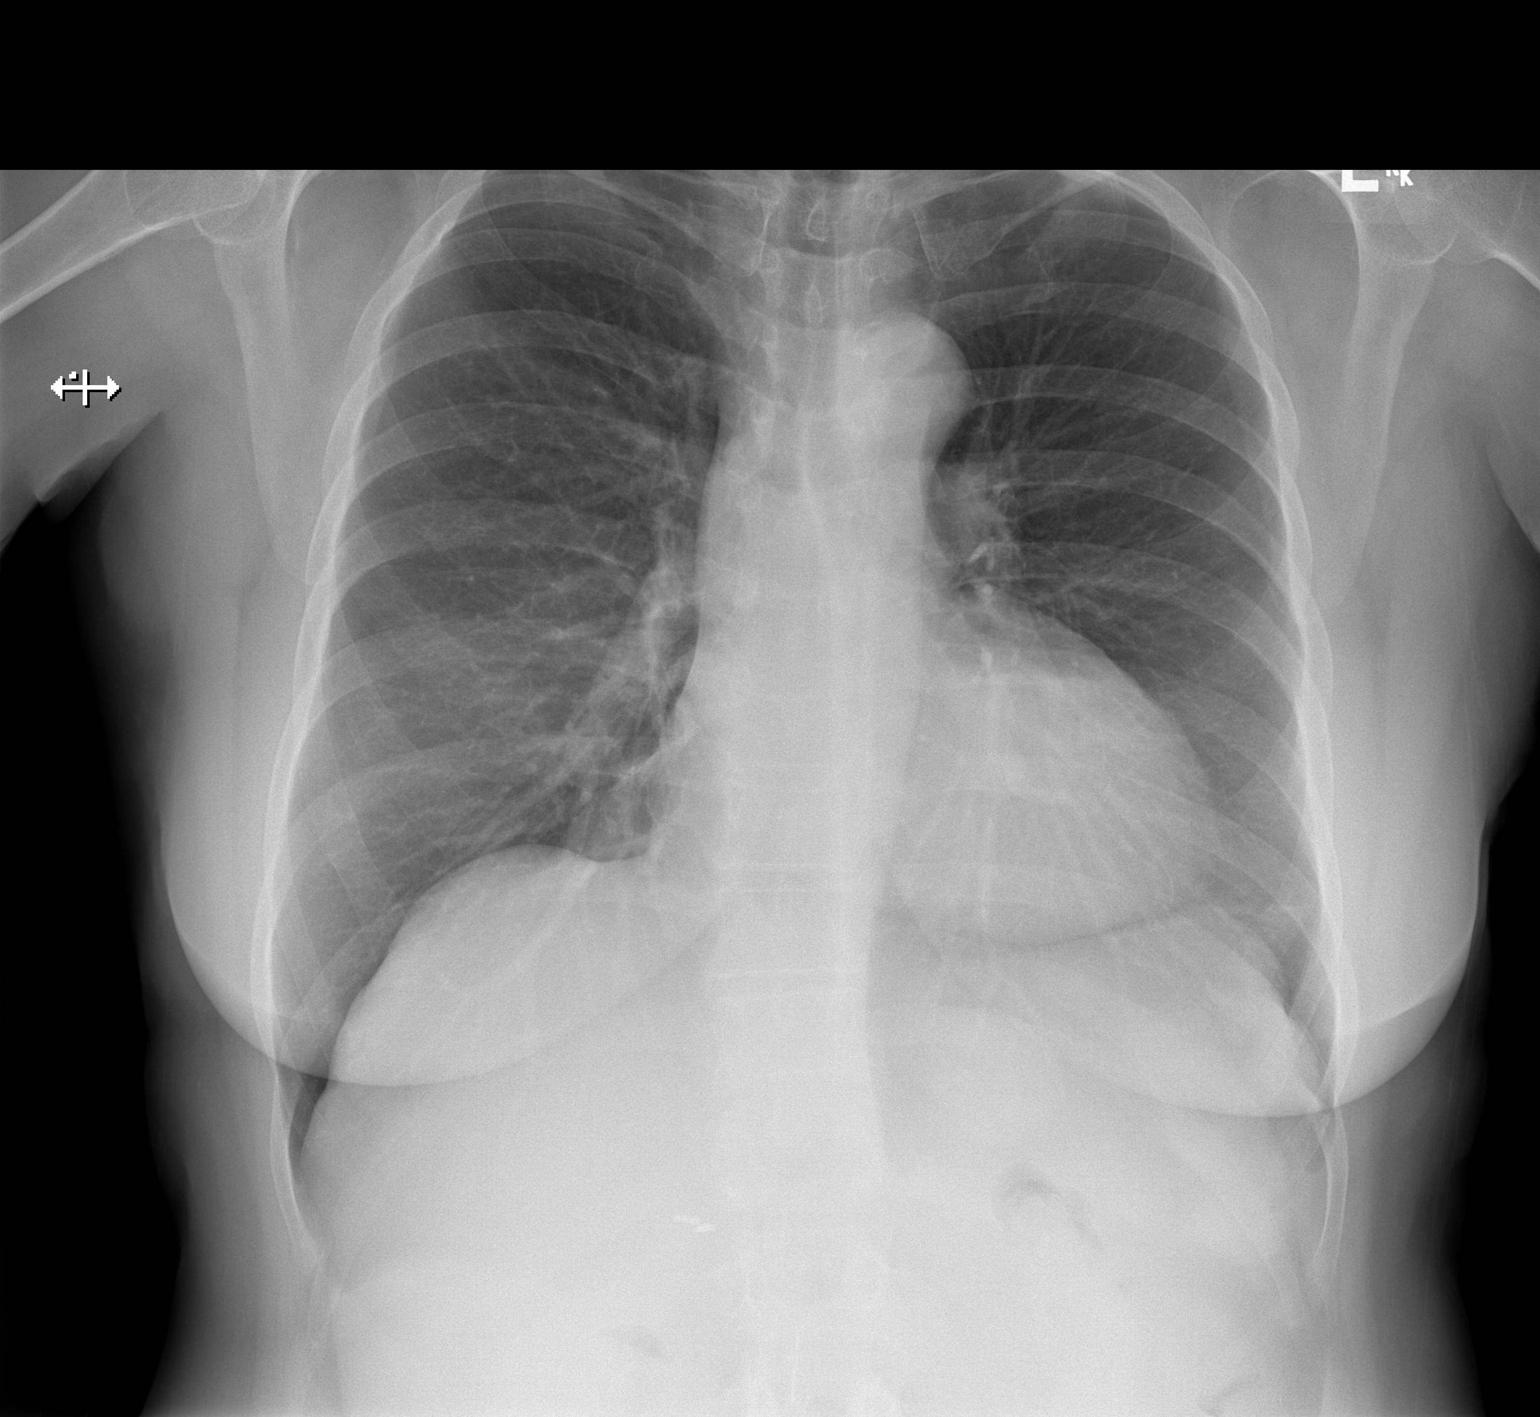

[w chest lat]
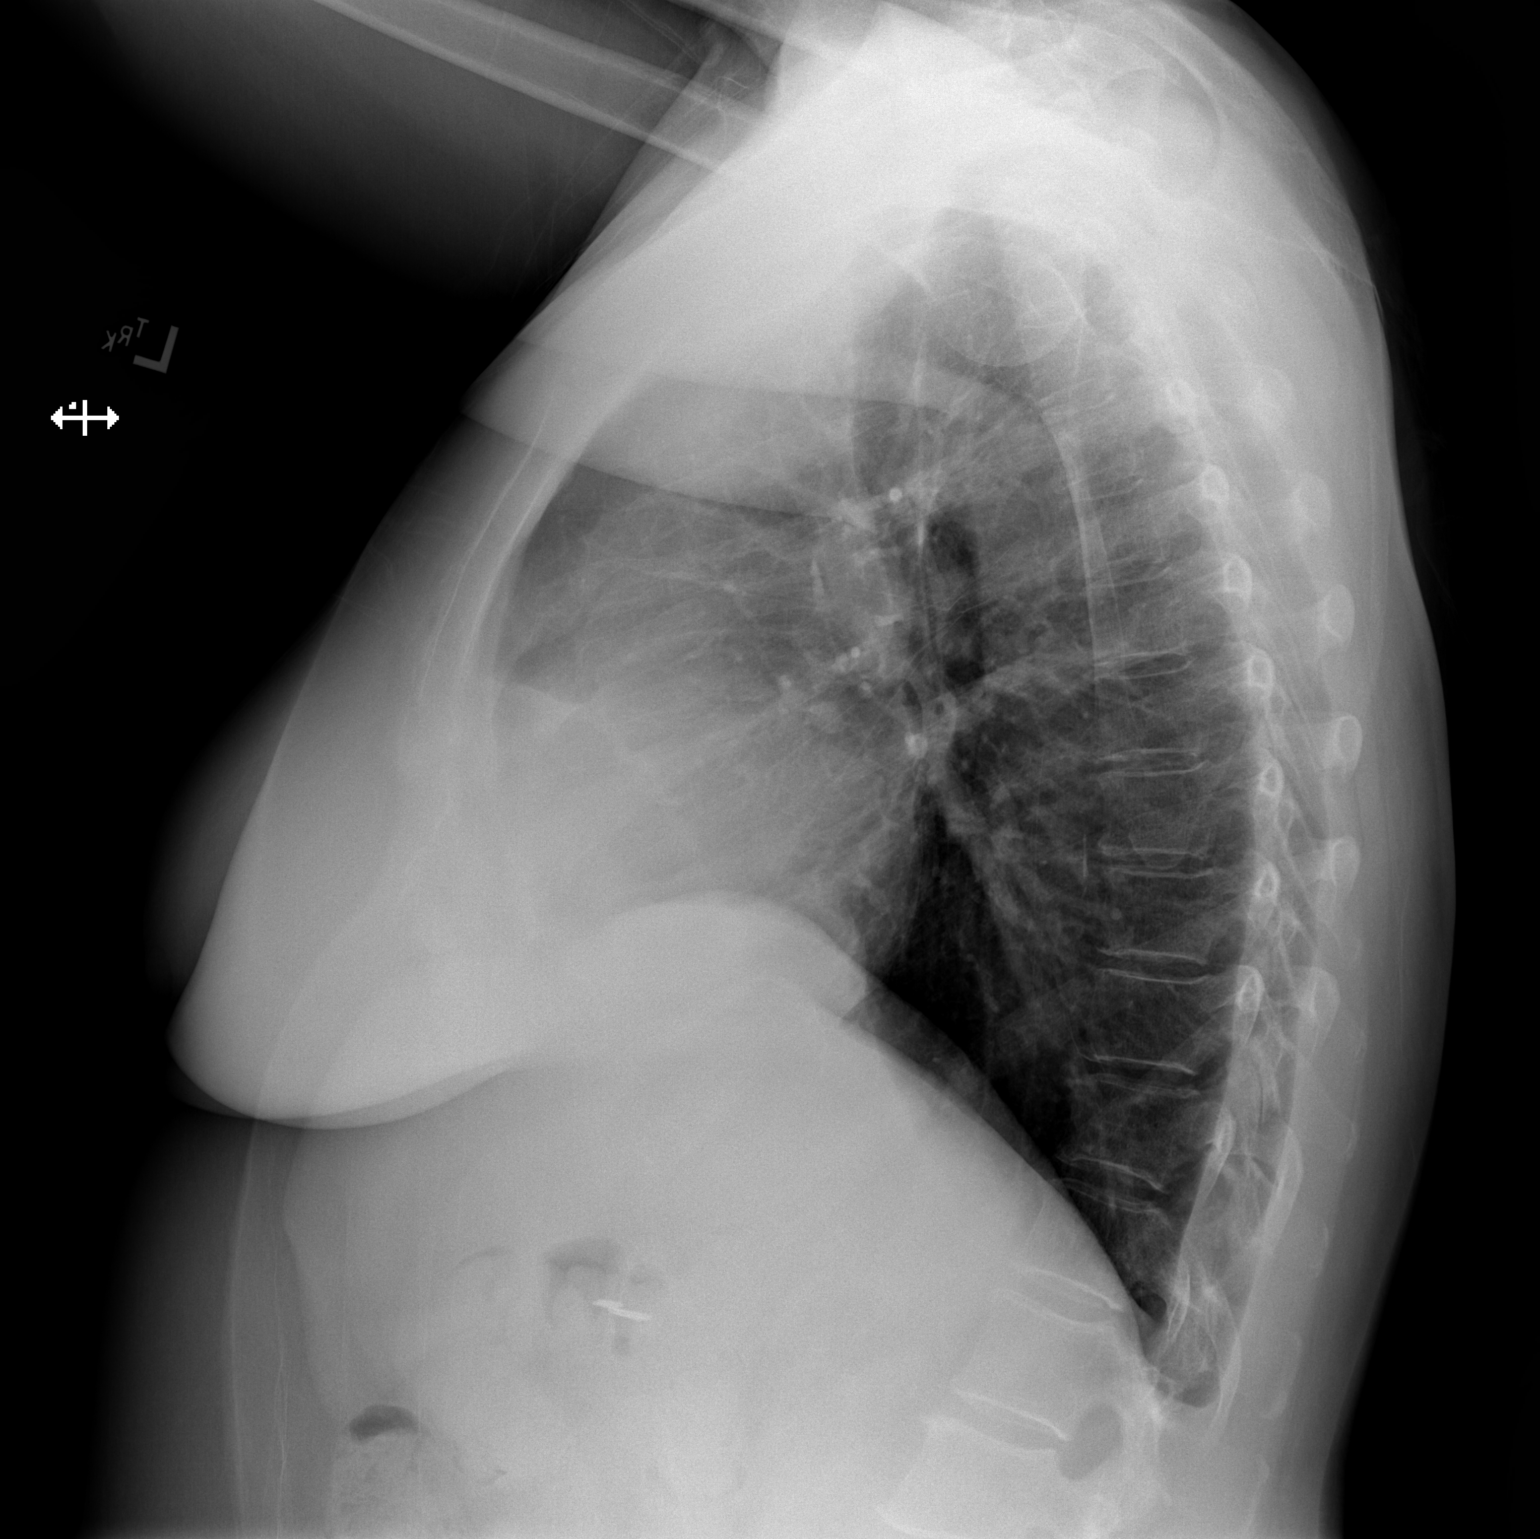

[2 of 2 positions shown; findings below may reference images not displayed]

FINDINGS: Lungs are clear. Heart size is upper normal. Pulmonary vascularity
is normal. No adenopathy. No bone lesions.
IMPRESSION: Lungs clear.  Heart upper normal in size.

## 2021-05-01 NOTE — Progress Notes (Signed)
HPI: Follow-up aortic stenosis/AVR. Cardiac catheterization May 2021 showed mild nonobstructive coronary disease, severe aortic stenosis and mildly elevated pulmonary pressure. Preoperative carotid Dopplers June 2021 showed near normal carotids bilaterally. Patient had minimally invasive aortic valve replacement with Ignacia Marvel Resilia stented bovine pericardial tissue valve June 2021.  Outside echocardiogram October 2022 showed normal LV function, mild to moderate left atrial enlargement, previous aortic valve replacement with mean gradient 9 mmHg and trace aortic insufficiency, mild to moderate mitral regurgitation and mild tricuspid regurgitation.  Since last seen,   Current Outpatient Medications  Medication Sig Dispense Refill   acetaminophen (TYLENOL) 500 MG tablet Take 2 tablets (1,000 mg total) by mouth every 6 (six) hours as needed for mild pain or fever. 30 tablet 0   amLODipine (NORVASC) 10 MG tablet Take 1 tablet (10 mg total) by mouth daily. 90 tablet 3   aspirin EC 325 MG EC tablet Take 1 tablet (325 mg total) by mouth daily. 30 tablet 0   atorvastatin (LIPITOR) 40 MG tablet Take 40 mg by mouth daily.      carboxymethylcellulose (REFRESH PLUS) 0.5 % SOLN Place 1 drop into both eyes daily.      cholecalciferol (VITAMIN D) 25 MCG (1000 UNIT) tablet Take 1,000 Units by mouth 2 (two) times daily.      diclofenac (CATAFLAM) 50 MG tablet Take 50 mg by mouth 2 (two) times daily as needed (migraine).      divalproex (DEPAKOTE ER) 500 MG 24 hr tablet Take 500 mg by mouth daily.     lansoprazole (PREVACID) 30 MG capsule Take 30 mg by mouth 2 (two) times daily.      levothyroxine (SYNTHROID) 75 MCG tablet Take 75 mcg by mouth daily before breakfast.      Melatonin 5 MG CAPS Take 5 mg by mouth at bedtime as needed (for sleep).     metoprolol succinate (TOPROL-XL) 50 MG 24 hr tablet Take 50 mg by mouth daily.      Multiple Vitamin (MULTIVITAMIN WITH MINERALS) TABS tablet Take 1 tablet  by mouth daily.     NURTEC 75 MG TBDP Take 1 tablet by mouth daily as needed for headache.     Omega-3 Fatty Acids (EQL OMEGA 3 FISH OIL) 1000 MG CAPS Take 1,000 mg by mouth 2 (two) times daily.      tretinoin (RETIN-A) 0.05 % cream Apply 1 application topically daily as needed (Outbreak).      vitamin B-12 (CYANOCOBALAMIN) 1000 MCG tablet Take 1,000 mcg by mouth daily.      No current facility-administered medications for this visit.     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 09/07/2019   21 mm Edwards Inspiris Resilia stented bovine pericardial tissue valve via right anterior mini thoracotomy approach   Scleroderma ([place])  Sleep apnea    wears cpap   Wears glasses    Wears partial dentures     Past Surgical History:  Procedure Laterality Date   ABDOMINAL HYSTERECTOMY     TAH   AORTIC VALVE REPLACEMENT N/A 09/07/2019   Procedure: MINIMALLY INVASIVE AORTIC VALVE REPLACEMENT (AVR) using [employer] INSPIRIS Resilia 21 MM Aortic Valve.;  Surgeon: Rexene Alberts, MD;  Location: [place];  Service: Open Heart Surgery;  Laterality: N/A;   BUNIONECTOMY     CARDIAC CATHETERIZATION N/A 04/24/2015   Procedure: Right/Left Heart Cath and Coronary Angiography;  Surgeon: Sherren Mocha, MD;  Location: [place] [facility];  Service: Cardiovascular;  Laterality: N/A;   CHOLECYSTECTOMY     COLONOSCOPY WITH ESOPHAGOGASTRODUODENOSCOPY (EGD)     HARDWARE  REMOVAL Right 01/20/2018   Procedure: Removal of deep implants right medial cuneiform, 1st metatarsal and 2nd metatarsal;  Surgeon: Wylene Simmer, MD;  Location: [place];  Service: Orthopedics;  Laterality: Right;  49min   MULTIPLE TOOTH EXTRACTIONS     RIGHT/LEFT HEART CATH AND CORONARY ANGIOGRAPHY N/A 07/26/2019   Procedure: RIGHT/LEFT HEART CATH AND CORONARY ANGIOGRAPHY;  Surgeon: Sherren Mocha, MD;  Location: [place] [facility];  Service: Cardiovascular;  Laterality: N/A;   sympathomyectomies  2000   Bilateral wrists   TEE WITHOUT CARDIOVERSION N/A 09/07/2019   Procedure: TRANSESOPHAGEAL ECHOCARDIOGRAM (TEE);  Surgeon: Rexene Alberts, MD;  Location: [place];  Service: Open Heart Surgery;  Laterality: N/A;   THROMBECTOMY Left 1999   wrist    Social History   Socioeconomic History   Marital status: Legally Separated    Spouse name: Not on file   Number of children: Not on file   Years of education: Not on file   Highest education level: Not on file  Occupational History   Not on file  Tobacco Use   Smoking status: Never   Smokeless tobacco: Never  Vaping Use   Vaping Use: Never used  Substance and Sexual Activity   Alcohol use: No    Alcohol/week: 0.0 standard drinks   Drug use: No   Sexual activity: Not on file  Other Topics Concern   Not on file  Social History Narrative   Not on file   Social Determinants of Health   Financial Resource Strain: Not on file  Food Insecurity: Not on file  Transportation Needs: Not on file  Physical Activity: Not on file  Stress: Not on file  Social Connections: Not on file  Intimate Partner Violence: Not on file    Family History  Adopted: Yes  Problem Relation Age of Onset   Heart attack Sister 43       deceased    ROS: no fevers or chills, productive cough, hemoptysis, dysphasia, odynophagia, melena, hematochezia, dysuria, hematuria, rash, seizure activity, orthopnea, PND, pedal edema, claudication. Remaining systems are  negative.  Physical Exam: Well-developed well-nourished in no acute distress.  Skin is warm and dry.  HEENT is normal.  Neck is supple.  Chest is clear to auscultation with normal expansion.  Cardiovascular exam is regular rate and rhythm.  Abdominal exam nontender or distended. No masses palpated. Extremities show no edema. neuro grossly intact  ECG- personally reviewed  A/P  1 status post aortic valve replacement-most recent echocardiogram showed normally functioning aortic valve.  Continue SBE prophylaxis.  2 history of nonobstructive coronary disease-plan to continue medical therapy with aspirin and statin.  3 hypertension-patient's blood pressure is controlled.  Continue present medications.  3 hyperlipidemia-continue  statin.  Kirk Ruths, MD

## 2021-05-05 IMAGING — DX DG CHEST 1V PORT
1 series · 1 of 1 positions shown · non-contrast
Comparison: September 08, 2019

CLINICAL DATA: Atelectasis.

EXAM:
PORTABLE CHEST 1 VIEW

[chest]
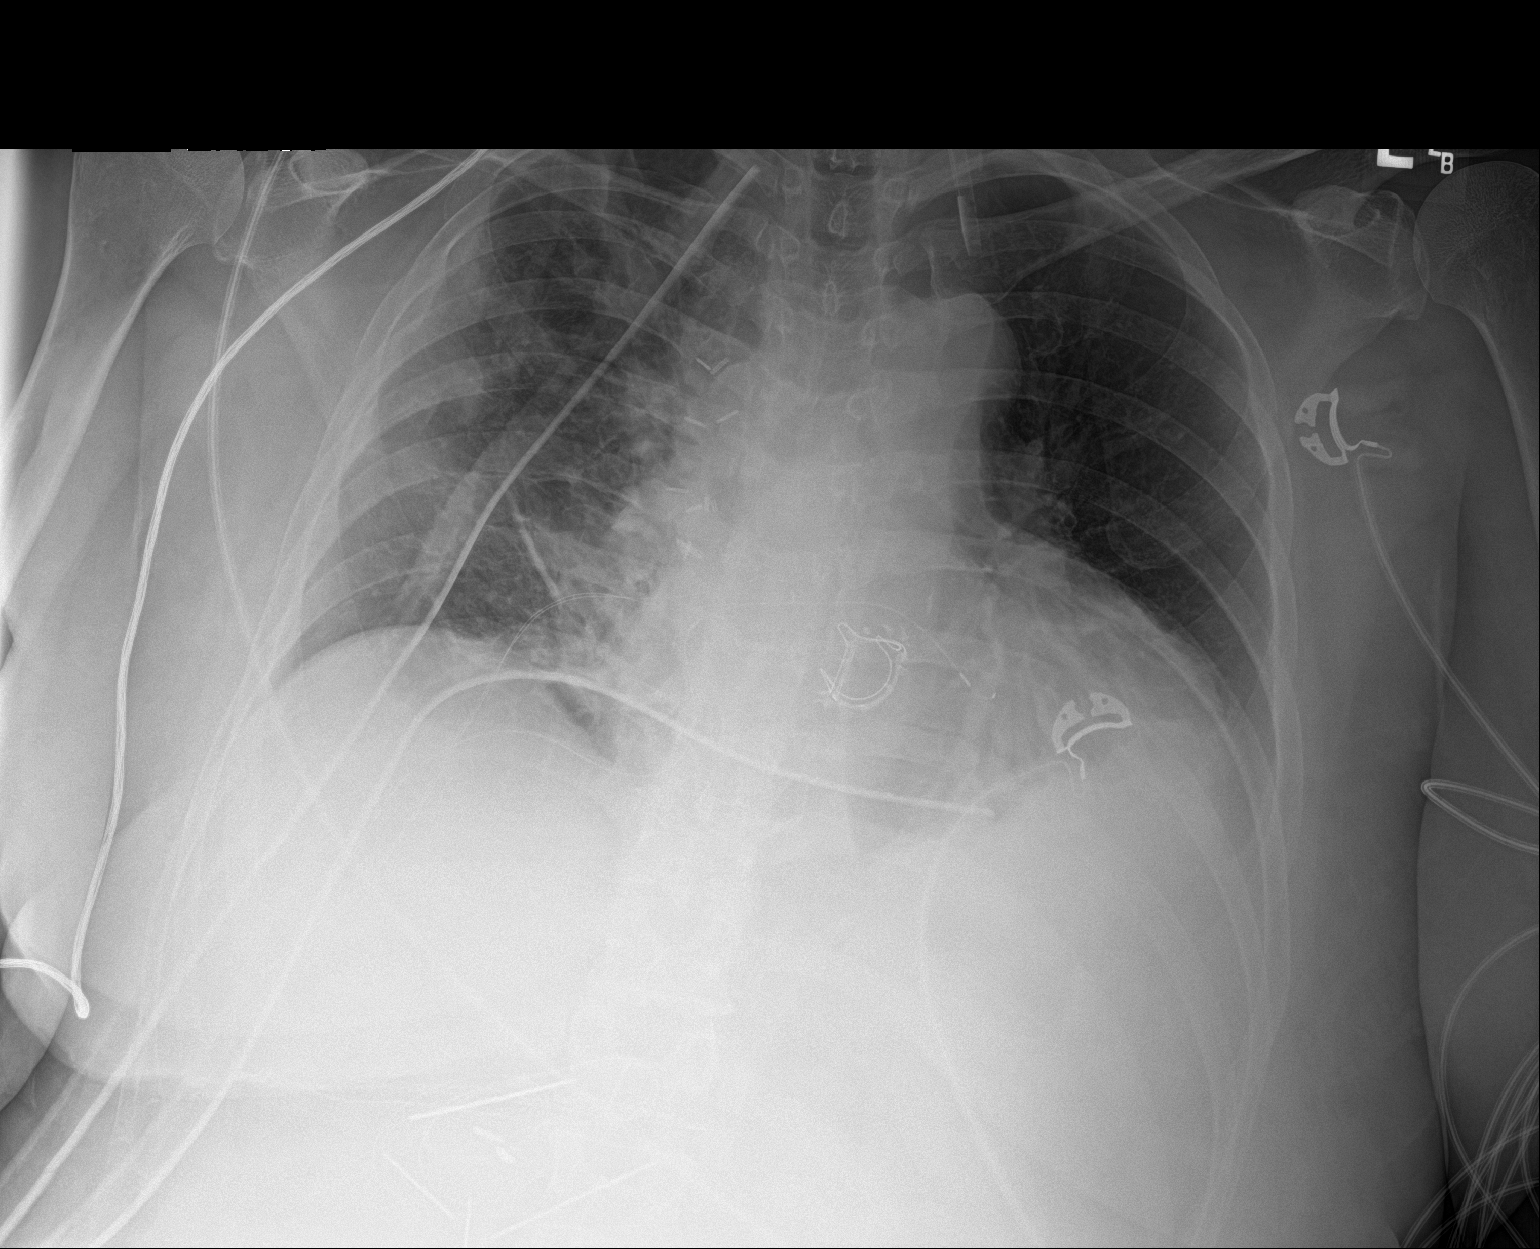

[1 of 1 positions shown; findings below may reference images not displayed]

FINDINGS: The PA catheter is been removed. A left IJ sheath remains. Chest
tubes remain. No pneumothorax. Cardiomediastinal silhouette is
stable. Surgical changes of aortic valve replacement. Epicardial
pacer leads remain in place. Ill-defined linear and patchy opacities
in bilateral lungs are stable.
IMPRESSION: 1. Postoperative changes of aortic valve replacement.
2. Support apparatus as above.
3. Persistently low lung volumes with basilar atelectasis. No
definitive edema.
4. No other changes.

## 2021-05-07 IMAGING — DX DG CHEST 1V PORT
1 series · 1 of 1 positions shown · non-contrast
Comparison: One-view chest x-ray 09/10/2019

CLINICAL DATA: Shortness of breath. Status post aortic valve
replacement.

EXAM:
PORTABLE CHEST 1 VIEW

[chest ap]
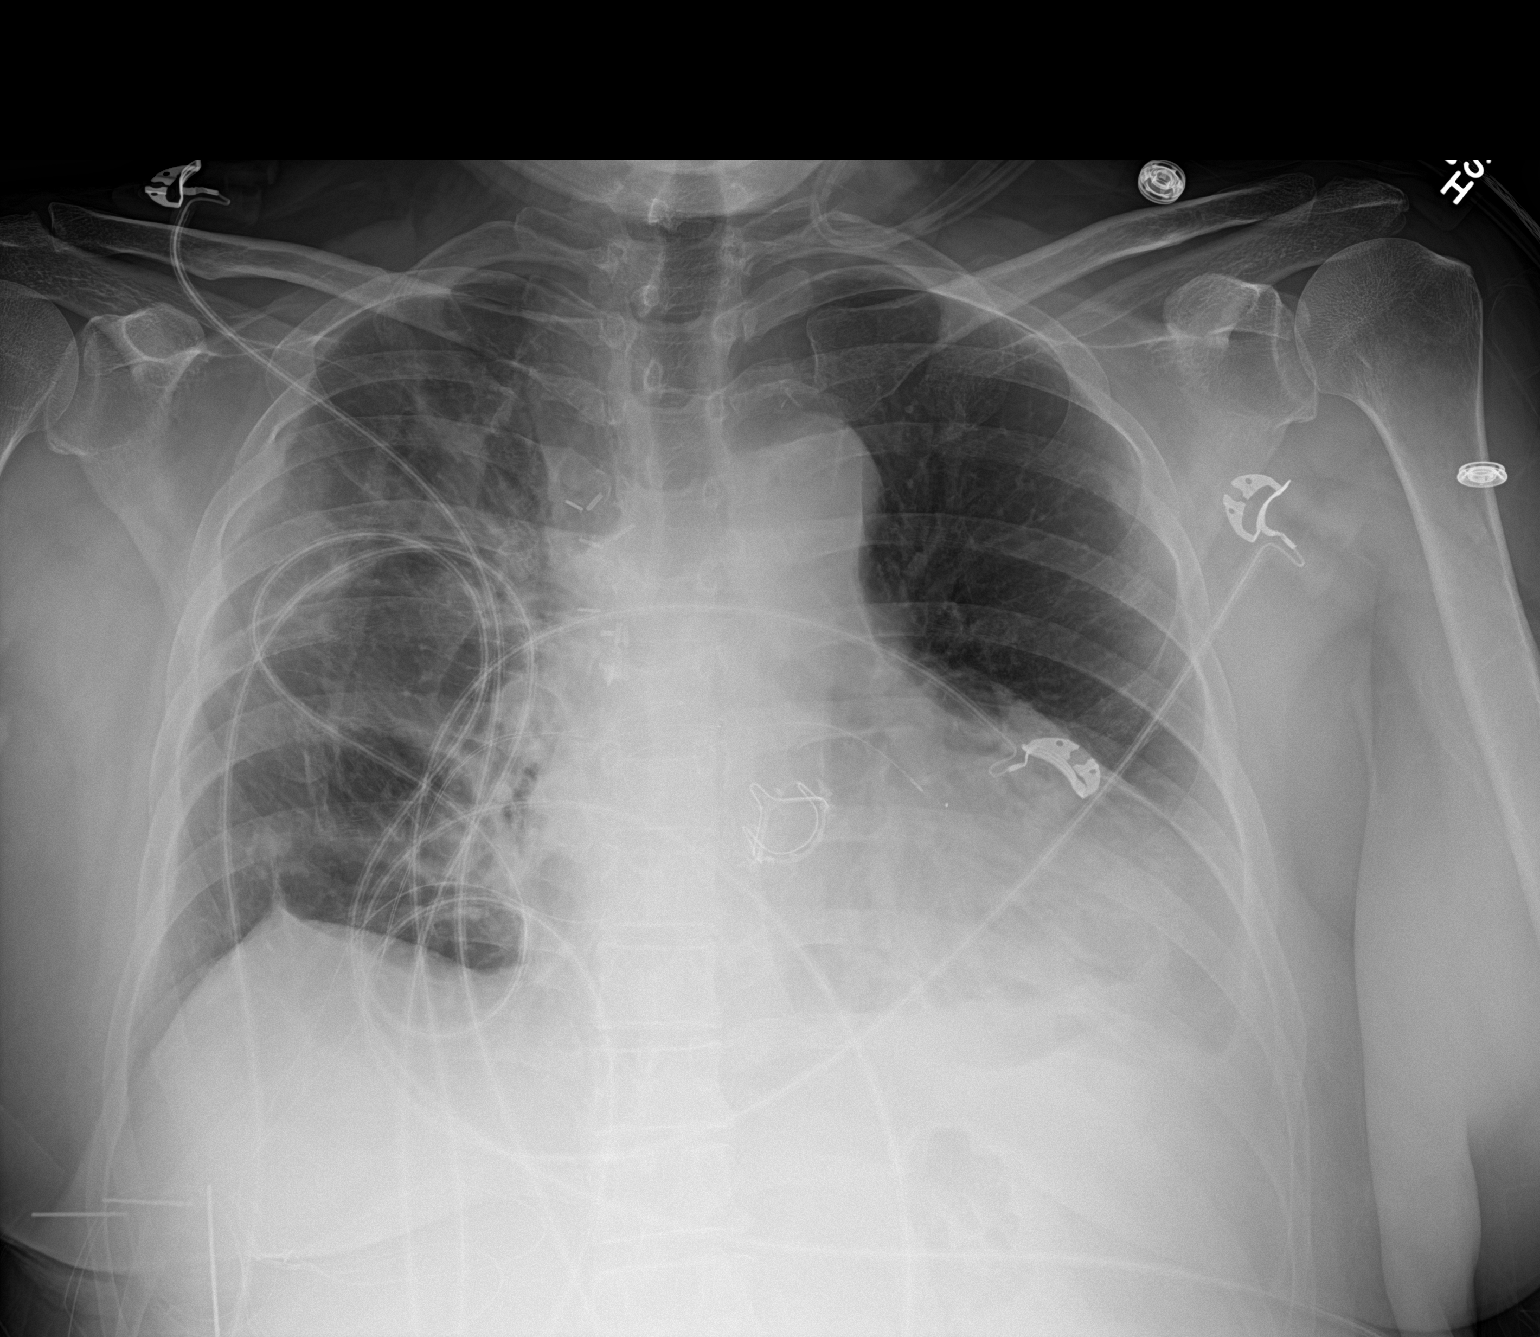

[1 of 1 positions shown; findings below may reference images not displayed]

FINDINGS: Heart is enlarged. Aortic valve replacement is noted. Aeration is
improving in both lungs. Left greater than right pleural effusion is
also improving.
IMPRESSION: 1. Improving aeration in both lungs.
2. Improving bilateral pleural effusions.

## 2021-05-08 IMAGING — CR DG CHEST 2V
2 series · 2 of 2 positions shown · non-contrast
Comparison: One-view chest x-ray 09/11/2019

CLINICAL DATA: Left pleural effusion. Status post aortic valve
replacement.

EXAM:
CHEST - 2 VIEW

[chest pa]
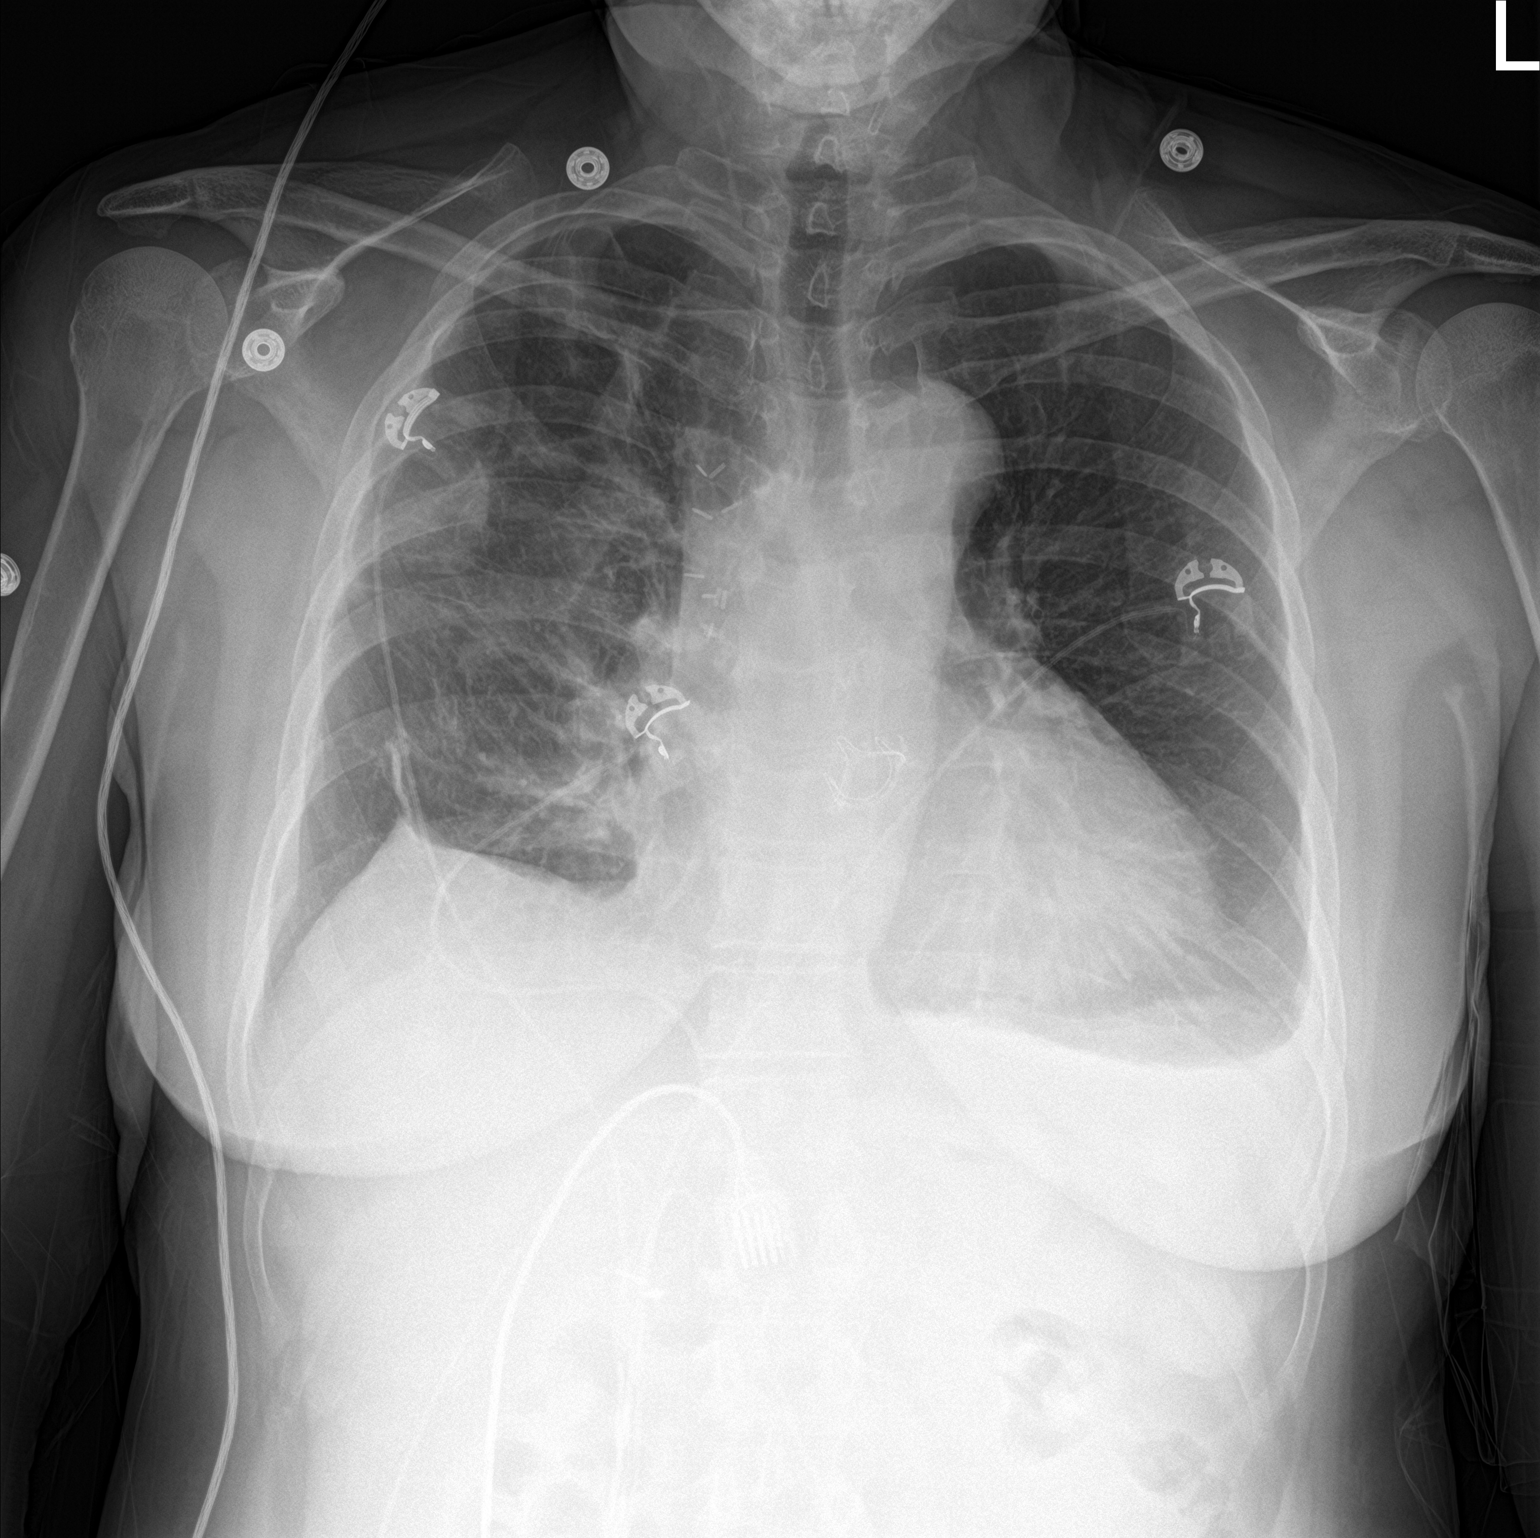

[chest lat]
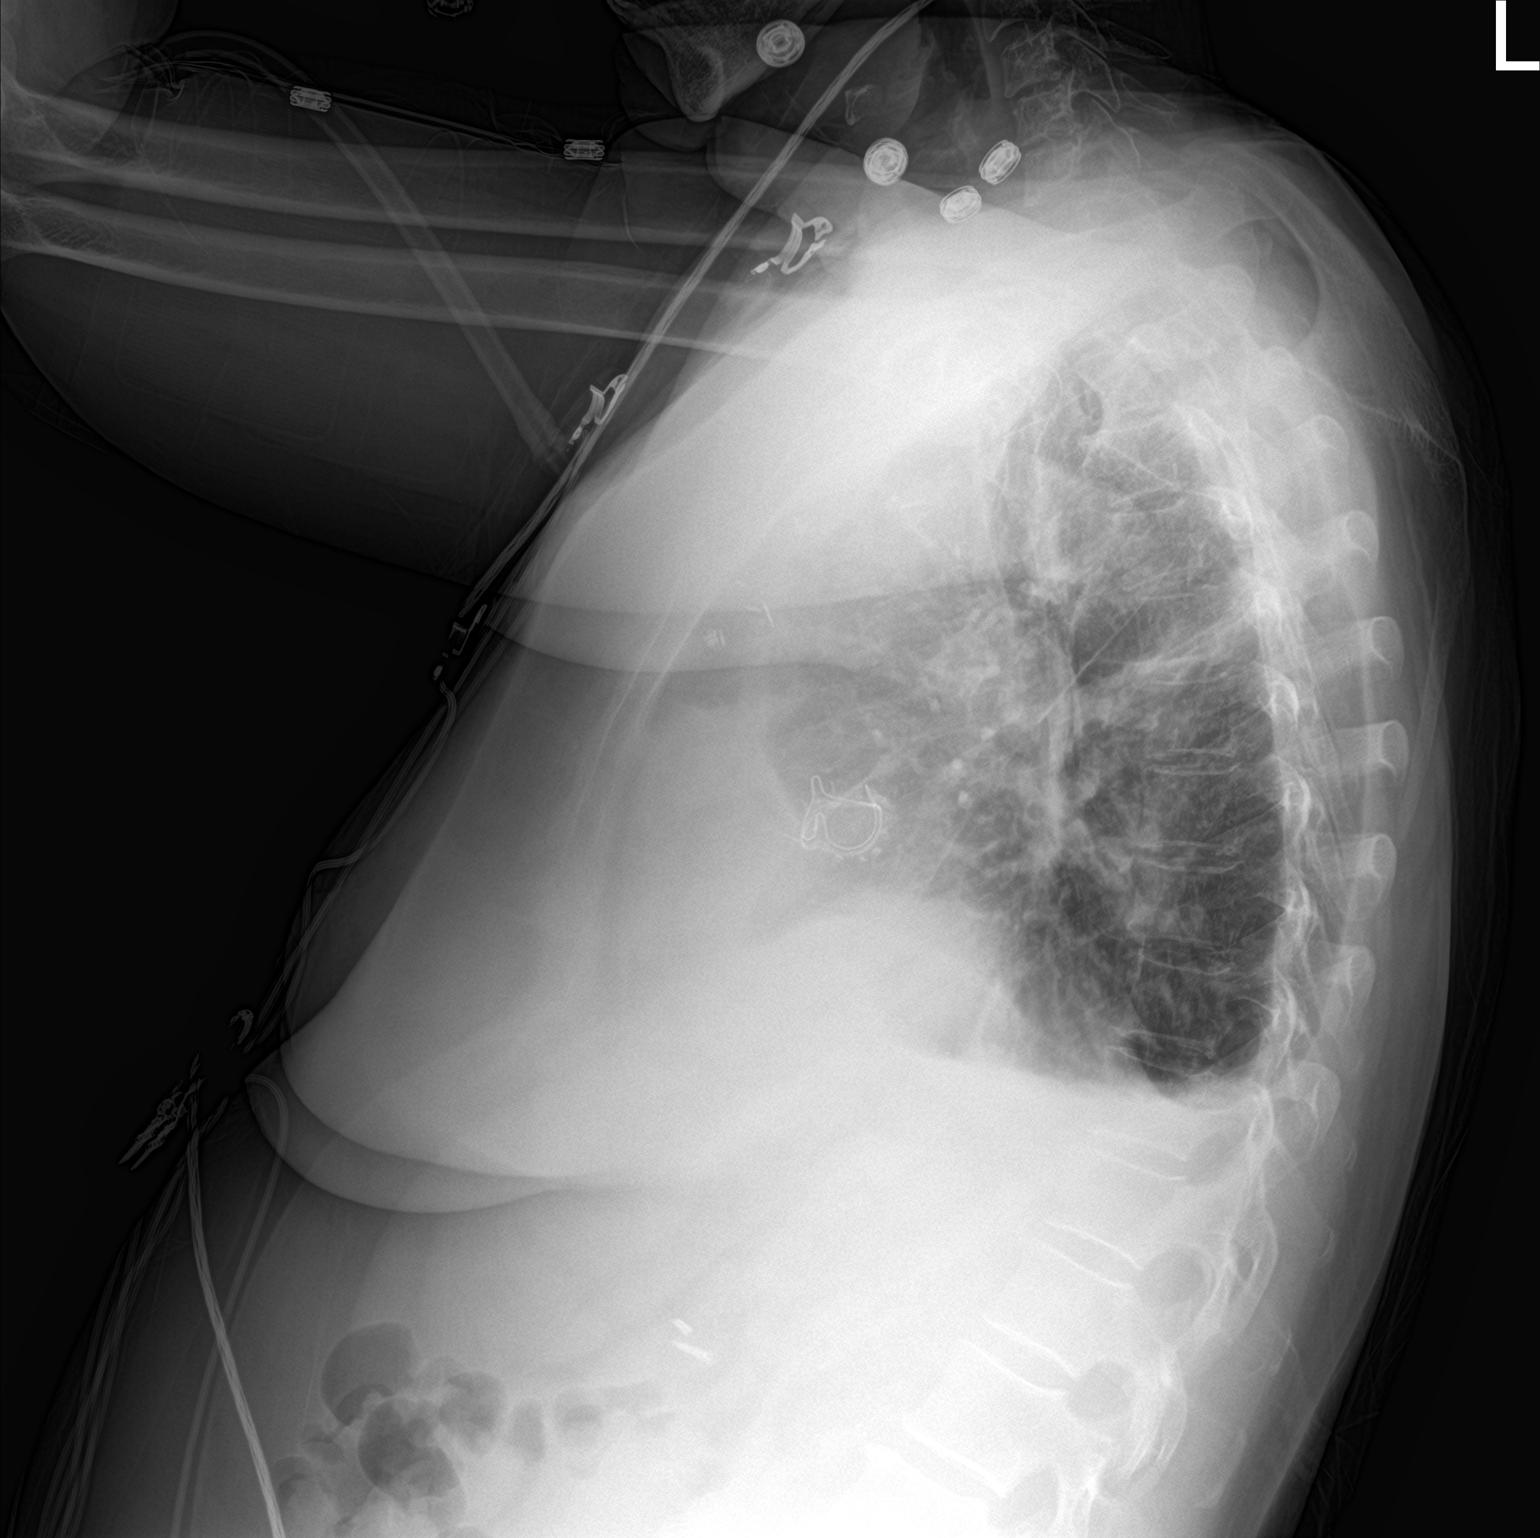

[2 of 2 positions shown; findings below may reference images not displayed]

FINDINGS: Heart is enlarged. Atherosclerotic changes are noted. Air valve
replacement is noted. Bilateral pleural effusions remain, left
greater than right. Aeration is improved at the left base. Right
upper lobe airspace disease/atelectasis is also improving.
IMPRESSION: 1. Improving aeration at the left base and right upper lobe.
2. Persistent bilateral pleural effusions, left greater than right.
3. Cardiomegaly without failure.

## 2021-05-14 ENCOUNTER — Encounter: Payer: Self-pay | Admitting: Cardiology

## 2021-05-14 ENCOUNTER — Ambulatory Visit (INDEPENDENT_AMBULATORY_CARE_PROVIDER_SITE_OTHER): Payer: Medicare HMO

## 2021-05-14 ENCOUNTER — Other Ambulatory Visit: Payer: Self-pay

## 2021-05-14 ENCOUNTER — Ambulatory Visit: Payer: Medicare HMO | Admitting: Cardiology

## 2021-05-14 VITALS — BP 138/82 | HR 70 | Ht 63.0 in | Wt 186.0 lb

## 2021-05-14 DIAGNOSIS — R55 Syncope and collapse: Secondary | ICD-10-CM

## 2021-05-14 DIAGNOSIS — I359 Nonrheumatic aortic valve disorder, unspecified: Secondary | ICD-10-CM

## 2021-05-14 DIAGNOSIS — R072 Precordial pain: Secondary | ICD-10-CM

## 2021-05-14 MED ORDER — ASPIRIN EC 81 MG PO TBEC: 81.0000 mg | DELAYED_RELEASE_TABLET | Freq: Every day | ORAL | Status: AC

## 2021-05-14 NOTE — Patient Instructions (Signed)
Medication Instructions:   DECREASE ASPIRIN TO 81 MG ONCE DAILY  *If you need a refill on your cardiac medications before your next appointment, please call your pharmacy*  Testing/Procedures:  CTA OF THE CHEST AT THE [place] OFFICE -1ST FLOOR IMAGING  ZIO XT- Long Term Monitor Instructions  Your physician has requested you wear a ZIO patch monitor for 7 days.  This is a single patch monitor. [employer] supplies one patch monitor per enrollment. Additional stickers are not available. Please do not apply patch if you will be having a Nuclear Stress Test,  Echocardiogram, Cardiac CT, MRI, or Chest Xray during the period you would be wearing the  monitor. The patch cannot be worn during these tests. You cannot remove and re-apply the  ZIO XT patch monitor.  Your ZIO patch monitor will be mailed 3 day USPS to your address on file. It may take 3-5 days  to receive your monitor after you have been enrolled.  Once you have received your monitor, please review the enclosed instructions. Your monitor  has already been registered assigning a specific monitor serial # to you.  Billing and Patient Assistance Program Information  We have supplied [employer] with any of your insurance information on file for billing purposes. [employer] offers a sliding scale Patient Assistance Program for patients that do not have  insurance, or whose insurance does not completely cover the cost of the ZIO monitor.  You must apply for the Patient Assistance Program to qualify for this discounted rate.  To apply, please call [employer] at [phone], select option 4, select option 2, ask to apply for  Patient Assistance Program. [employer] will ask your household income, and how many people  are in your household. They will quote your out-of-pocket cost based on that information.  [employer] will also be able to set up a 7461-month, interest-free payment plan if needed.  Applying the monitor   Shave hair from upper left  chest.  Hold abrader disc by orange tab. Rub abrader in 40 strokes over the upper left chest as  indicated in your monitor instructions.  Clean area with 4 enclosed alcohol pads. Let dry.  Apply patch as indicated in monitor instructions. Patch will be placed under collarbone on left  side of chest with arrow pointing upward.  Rub patch adhesive wings for 2 minutes. Remove white label marked "1". Remove the white  label marked "2". Rub patch adhesive wings for 2 additional minutes.  While looking in a mirror, press and release button in center of patch. A small green light will  flash 3-4 times. This will be your only indicator that the monitor has been turned on.  Do not shower for the first 24 hours. You may shower after the first 24 hours.  Press the button if you feel a symptom. You will hear a small click. Record Date, Time and  Symptom in the Patient Logbook.  When you are ready to remove the patch, follow instructions on the last 2 pages of Patient  Logbook. Stick patch monitor onto the last page of Patient Logbook.  Place Patient Logbook in the blue and white box. Use locking tab on box and tape box closed  securely. The blue and white box has prepaid postage on it. Please place it in the mailbox as  soon as possible. Your physician should have your test results approximately 7 days after the  monitor has been [facility].  Call [facility] Customer Care at [phone]  if you have questions regarding  your ZIO XT patch monitor. Call them immediately if you see an orange light blinking on your  monitor.  If your monitor falls off in less than 4 days, contact our Monitor department at [phone].  If your monitor becomes loose or falls off after 4 days call [employer] at [phone] for  suggestions on securing your monitor    Follow-Up: At [facility], you and your health needs are our priority.  As part of our continuing mission to provide you with  exceptional heart care, we have created designated Provider Care Teams.  These Care Teams include your primary Cardiologist (physician) and Advanced Practice Providers (APPs -  Physician Assistants and Nurse Practitioners) who all work together to provide you with the care you need, when you need it.  We recommend signing up for the patient portal called "MyChart".  Sign up information is provided on this After Visit Summary.  MyChart is used to connect with patients for Virtual Visits (Telemedicine).  Patients are able to view lab/test results, encounter notes, upcoming appointments, etc.  Non-urgent messages can be sent to your provider as well.   To learn more about what you can do with MyChart, go to [email or web address].    Your next appointment:   8 week(s)  The format for your next appointment:   In Person  Provider:   Olga Millers, MD

## 2021-05-14 NOTE — Progress Notes (Unsigned)
Enrolled patient for a 7 day Zio XT monitor to be mailed to patients home.  

## 2021-05-14 NOTE — Progress Notes (Signed)
HPI: Follow-up aortic stenosis/AVR. Cardiac catheterization May 2021 showed mild nonobstructive coronary disease, severe aortic stenosis and mildly elevated pulmonary pressure. Preoperative carotid Dopplers June 2021 showed near normal carotids bilaterally. Patient had minimally invasive aortic valve replacement with Ignacia Marvel Resilia stented bovine pericardial tissue valve June 2021.  Outside echocardiogram October 2022 showed normal LV function, mild to moderate left atrial enlargement, previous aortic valve replacement with mean gradient 9 mmHg and trace aortic insufficiency, mild to moderate mitral regurgitation and mild tricuspid regurgitation.  Since last seen, she describes intermittent pain beginning in her right scapular area radiating to her right upper extremity and then underneath her left breast.  This is not exertional.  It can occur at any time and can last all day at times.  No change with position.  She does not have exertional chest pain and denies dyspnea.  She is also having episodes of dizziness that are sudden in onset.  Not necessarily related to position.  Can occur lying, sitting or standing.  Typically lasts several minutes and resolve spontaneously.  She has occasional palpitations as well.  She has not had syncope.  Current Outpatient Medications  Medication Sig Dispense Refill   acetaminophen (TYLENOL) 500 MG tablet Take 2 tablets (1,000 mg total) by mouth every 6 (six) hours as needed for mild pain or fever. 30 tablet 0   aspirin EC 325 MG EC tablet Take 1 tablet (325 mg total) by mouth daily. 30 tablet 0   atorvastatin (LIPITOR) 40 MG tablet Take 40 mg by mouth daily.      carboxymethylcellulose (REFRESH PLUS) 0.5 % SOLN Place 1 drop into both eyes daily.     cholecalciferol (VITAMIN D) 25 MCG (1000 UNIT) tablet Take 1,000 Units by mouth 2 (two) times daily.      diclofenac (CATAFLAM) 50 MG tablet Take 50 mg by mouth 2 (two) times daily as needed (migraine).       lansoprazole (PREVACID) 30 MG capsule Take 30 mg by mouth 2 (two) times daily.      levothyroxine (SYNTHROID) 75 MCG tablet Take 75 mcg by mouth daily before breakfast.      metoprolol succinate (TOPROL-XL) 50 MG 24 hr tablet Take 50 mg by mouth daily.      Multiple Vitamin (MULTIVITAMIN WITH MINERALS) TABS tablet Take 1 tablet by mouth daily.     NURTEC 75 MG TBDP Take 1 tablet by mouth daily as needed for headache.     Omega-3 Fatty Acids (EQL OMEGA 3 FISH OIL) 1000 MG CAPS Take 1,000 mg by mouth 2 (two) times daily.      vitamin B-12 (CYANOCOBALAMIN) 1000 MCG tablet Take 1,000 mcg by mouth daily.      amLODipine (NORVASC) 10 MG tablet Take 1 tablet (10 mg total) by mouth daily. 90 tablet 3   divalproex (DEPAKOTE ER) 500 MG 24 hr tablet Take 500 mg by mouth daily. (Patient not taking: Reported on 05/14/2021)     traZODone (DESYREL) 50 MG tablet Take 2 tablets by mouth at bedtime.     No current facility-administered medications for this visit.     Past Medical History:  Diagnosis Date   Anxiety    Aortic stenosis    CHF (congestive heart failure) (HCC)    COPD (chronic obstructive pulmonary disease) (HCC)    Depression    Dyspnea    on exertion and lying down   GERD (gastroesophageal reflux disease)    H/O blood clots  Headache    migraines   Heart murmur    History of hiatal hernia    Hyperlipidemia    Hypertension    Hypothyroidism    Insomnia    Painful orthopaedic hardware ([place])    Raynaud disease    Rheumatoid arthritis (HCC)    S/P minimally-invasive aortic valve replacement with bioprosthetic valve 09/07/2019   21 mm Edwards Inspiris Resilia stented bovine pericardial tissue valve via right anterior mini thoracotomy approach   Scleroderma ([place])    Sleep apnea    wears cpap   Wears glasses    Wears partial dentures     Past Surgical History:  Procedure Laterality Date   ABDOMINAL HYSTERECTOMY     TAH   AORTIC VALVE REPLACEMENT N/A 09/07/2019   Procedure:  MINIMALLY INVASIVE AORTIC VALVE REPLACEMENT (AVR) using [employer] INSPIRIS Resilia 21 MM Aortic Valve.;  Surgeon: Rexene Alberts, MD;  Location: [facility];  Service: Open Heart Surgery;  Laterality: N/A;   BUNIONECTOMY     CARDIAC CATHETERIZATION N/A 04/24/2015   Procedure: Right/Left Heart Cath and Coronary Angiography;  Surgeon: Sherren Mocha, MD;  Location: [place] [facility];  Service: Cardiovascular;  Laterality: N/A;   CHOLECYSTECTOMY     COLONOSCOPY WITH ESOPHAGOGASTRODUODENOSCOPY (EGD)     HARDWARE REMOVAL Right 01/20/2018   Procedure: Removal of deep implants right medial cuneiform, 1st metatarsal and 2nd metatarsal;  Surgeon: Wylene Simmer, MD;  Location: [place];  Service: Orthopedics;  Laterality: Right;  12min   MULTIPLE TOOTH EXTRACTIONS     RIGHT/LEFT HEART CATH AND CORONARY ANGIOGRAPHY N/A 07/26/2019   Procedure: RIGHT/LEFT HEART CATH AND CORONARY ANGIOGRAPHY;  Surgeon: Sherren Mocha, MD;  Location: [place] [facility];  Service: Cardiovascular;  Laterality: N/A;   sympathomyectomies  2000   Bilateral wrists   TEE WITHOUT CARDIOVERSION N/A 09/07/2019   Procedure: TRANSESOPHAGEAL ECHOCARDIOGRAM (TEE);  Surgeon: Rexene Alberts, MD;  Location: [place];  Service: Open Heart Surgery;  Laterality: N/A;   THROMBECTOMY Left 1999   wrist    Social History   Socioeconomic History   Marital status: Legally Separated    Spouse name: Not on file   Number of children: Not on file   Years of education: Not on file   Highest education level: Not on file  Occupational History   Not on file  Tobacco Use   Smoking status: Never   Smokeless tobacco: Never  Vaping Use   Vaping Use: Never used  Substance and Sexual Activity   Alcohol use: No    Alcohol/week: 0.0 standard drinks   Drug use: No   Sexual activity: Not on file  Other Topics Concern   Not on file  Social History Narrative   Not on file   Social Determinants of Health   Financial Resource Strain: Not on file   Food Insecurity: Not on file  Transportation Needs: Not on file  Physical Activity: Not on file  Stress: Not on file  Social Connections: Not on file  Intimate Partner Violence: Not on file    Family History  Adopted: Yes  Problem Relation Age of Onset   Heart attack Sister 38       deceased    ROS: no fevers or chills, productive cough, hemoptysis, dysphasia, odynophagia, melena, hematochezia, dysuria, hematuria, rash, seizure activity, orthopnea, PND, pedal edema, claudication. Remaining systems are negative.  Physical Exam: Well-developed well-nourished in no acute distress.  Skin is warm and dry.  HEENT is normal.  Neck is  supple.  Chest is clear to auscultation with normal expansion.  Cardiovascular exam is regular rate and rhythm.  2/6 systolic murmur left sternal border. Abdominal exam nontender or distended. No masses palpated. Extremities show no edema. neuro grossly intact  ECG-normal sinus rhythm at a rate of 70, nonspecific ST changes.  Personally reviewed  A/P  1 status post aortic valve replacement-most recent echocardiogram showed normally functioning aortic valve.  Continue SBE prophylaxis.  2 chest pain-etiology unclear.  Electrocardiogram without significant change.  I will arrange a CTA to evaluate thoracic aorta given history of aortic valve replacement.  3 history of nonobstructive coronary disease-plan to continue medical therapy with aspirin and statin.  4 hypertension-patient's blood pressure is controlled.  Continue present medications.  5 hyperlipidemia-continue statin.  6 mild to moderate mitral regurgitation-noted on previous echocardiogram.  We will repeat study October 2023.  7 dizziness/palpitations-we will arrange a 7-day Zio patch to rule out significant arrhythmia.  Kirk Ruths, MD

## 2021-05-15 LAB — BASIC METABOLIC PANEL
BUN/Creatinine Ratio: 11 — ABNORMAL LOW (ref 12–28)
BUN: 12 mg/dL (ref 8–27)
CO2: 24 mmol/L (ref 20–29)
Calcium: 10.5 mg/dL — ABNORMAL HIGH (ref 8.7–10.3)
Chloride: 105 mmol/L (ref 96–106)
Creatinine, Ser: 1.11 mg/dL — ABNORMAL HIGH (ref 0.57–1.00)
Glucose: 83 mg/dL (ref 70–99)
Potassium: 4.1 mmol/L (ref 3.5–5.2)
Sodium: 143 mmol/L (ref 134–144)
eGFR: 57 mL/min/{1.73_m2} — ABNORMAL LOW (ref >59)

## 2021-05-19 ENCOUNTER — Encounter: Payer: Self-pay | Admitting: *Deleted

## 2021-05-20 ENCOUNTER — Encounter (HOSPITAL_BASED_OUTPATIENT_CLINIC_OR_DEPARTMENT_OTHER): Payer: Self-pay

## 2021-05-20 ENCOUNTER — Ambulatory Visit (HOSPITAL_BASED_OUTPATIENT_CLINIC_OR_DEPARTMENT_OTHER)
Admission: RE | Admit: 2021-05-20 | Discharge: 2021-05-20 | Disposition: A | Discharge status: Home / Self Care | Payer: Medicare HMO | Source: Ambulatory Visit | Attending: Cardiology | Admitting: Cardiology

## 2021-05-20 ENCOUNTER — Other Ambulatory Visit: Payer: Self-pay

## 2021-05-20 DIAGNOSIS — R55 Syncope and collapse: Secondary | ICD-10-CM

## 2021-05-20 DIAGNOSIS — R072 Precordial pain: Secondary | ICD-10-CM | POA: Diagnosis not present

## 2021-05-20 MED ORDER — IOHEXOL 350 MG/ML SOLN
100.0000 mL | Freq: Once | INTRAVENOUS | Status: AC | PRN
  Administered 2021-05-20: 100 mL via INTRAVENOUS

## 2021-05-22 ENCOUNTER — Other Ambulatory Visit: Payer: Self-pay | Admitting: *Deleted

## 2021-05-22 DIAGNOSIS — R0600 Dyspnea, unspecified: Secondary | ICD-10-CM

## 2021-06-09 ENCOUNTER — Encounter: Payer: Self-pay | Admitting: *Deleted

## 2021-06-20 ENCOUNTER — Ambulatory Visit: Payer: Medicare HMO | Admitting: Internal Medicine

## 2021-06-20 DIAGNOSIS — R0600 Dyspnea, unspecified: Secondary | ICD-10-CM | POA: Diagnosis not present

## 2021-06-20 LAB — PULMONARY FUNCTION TEST
DL/VA % pred: 90 %
DL/VA: 3.91 ml/min/mmHg/L
DLCO cor % pred: 80 %
DLCO cor: 14.7 ml/min/mmHg
DLCO unc % pred: 80 %
DLCO unc: 14.7 ml/min/mmHg
FEF 25-75 Post: 2.67 L/sec
FEF 25-75 Pre: 2.02 L/sec
FEF2575-%Change-Post: 32 %
FEF2575-%Pred-Post: 124 %
FEF2575-%Pred-Pre: 94 %
FEV1-%Change-Post: 6 %
FEV1-%Pred-Post: 99 %
FEV1-%Pred-Pre: 92 %
FEV1-Post: 2.23 L
FEV1-Pre: 2.09 L
FEV1FVC-%Change-Post: 2 %
FEV1FVC-%Pred-Pre: 103 %
FEV6-%Change-Post: 4 %
FEV6-%Pred-Post: 96 %
FEV6-%Pred-Pre: 92 %
FEV6-Post: 2.71 L
FEV6-Pre: 2.6 L
FEV6FVC-%Pred-Post: 103 %
FEV6FVC-%Pred-Pre: 103 %
FVC-%Change-Post: 4 %
FVC-%Pred-Post: 92 %
FVC-%Pred-Pre: 88 %
FVC-Post: 2.71 L
FVC-Pre: 2.6 L
Post FEV1/FVC ratio: 82 %
Post FEV6/FVC ratio: 100 %
Pre FEV1/FVC ratio: 81 %
Pre FEV6/FVC Ratio: 100 %
RV % pred: 138 %
RV: 2.56 L
TLC % pred: 113 %
TLC: 5.24 L

## 2021-06-20 NOTE — Progress Notes (Signed)
Full PFT performed today. °

## 2021-06-20 NOTE — Patient Instructions (Signed)
Full PFT performed today. °

## 2021-06-25 NOTE — Progress Notes (Deleted)
HPI: Follow-up aortic stenosis/AVR. Cardiac catheterization May 2021 showed mild nonobstructive coronary disease, severe aortic stenosis and mildly elevated pulmonary pressure. Preoperative carotid Dopplers June 2021 showed near normal carotids bilaterally. Patient had minimally invasive aortic valve replacement with Ansel BongEdwards Inspira Resilia stented bovine pericardial tissue valve June 2021.  Outside echocardiogram October 2022 showed normal LV function, mild to moderate left atrial enlargement, previous aortic valve replacement with mean gradient 9 mmHg and trace aortic insufficiency, mild to moderate mitral regurgitation and mild tricuspid regurgitation.  Monitor March 2023 showed sinus rhythm with rare PAC and PVC.  CTA February 2023 showed no pulmonary embolus and no complication related to aortic valve replacement.  Since last seen,   Current Outpatient Medications  Medication Sig Dispense Refill   acetaminophen (TYLENOL) 500 MG tablet Take 2 tablets (1,000 mg total) by mouth every 6 (six) hours as needed for mild pain or fever. 30 tablet 0   amLODipine (NORVASC) 10 MG tablet Take 1 tablet (10 mg total) by mouth daily. 90 tablet 3   aspirin 81 MG tablet Take 1 tablet (81 mg total) by mouth daily.     atorvastatin (LIPITOR) 40 MG tablet Take 40 mg by mouth daily.      carboxymethylcellulose (REFRESH PLUS) 0.5 % SOLN Place 1 drop into both eyes daily.     cholecalciferol (VITAMIN D) 25 MCG (1000 UNIT) tablet Take 1,000 Units by mouth 2 (two) times daily.      diclofenac (CATAFLAM) 50 MG tablet Take 50 mg by mouth 2 (two) times daily as needed (migraine).      divalproex (DEPAKOTE ER) 500 MG 24 hr tablet Take 500 mg by mouth daily. (Patient not taking: Reported on 05/14/2021)     lansoprazole (PREVACID) 30 MG capsule Take 30 mg by mouth 2 (two) times daily.      levothyroxine (SYNTHROID) 75 MCG tablet Take 75 mcg by mouth daily before breakfast.      metoprolol succinate (TOPROL-XL) 50 MG 24  hr tablet Take 50 mg by mouth daily.      Multiple Vitamin (MULTIVITAMIN WITH MINERALS) TABS tablet Take 1 tablet by mouth daily.     NURTEC 75 MG TBDP Take 1 tablet by mouth daily as needed for headache.     Omega-3 Fatty Acids (EQL OMEGA 3 FISH OIL) 1000 MG CAPS Take 1,000 mg by mouth 2 (two) times daily.      traZODone (DESYREL) 50 MG tablet Take 2 tablets by mouth at bedtime.     vitamin B-12 (CYANOCOBALAMIN) 1000 MCG tablet Take 1,000 mcg by mouth daily.      No current facility-administered medications for this visit.     Past Medical History:  Diagnosis Date   Anxiety    Aortic stenosis    CHF (congestive heart failure) (HCC)    COPD (chronic obstructive pulmonary disease) (HCC)    Depression    Dyspnea    on exertion and lying down   GERD (gastroesophageal reflux disease)    H/O blood clots    Headache    migraines   Heart murmur    History of hiatal hernia    Hyperlipidemia    Hypertension    Hypothyroidism    Insomnia    Painful orthopaedic hardware (HCC)    Raynaud disease    Rheumatoid arthritis (HCC)    S/P minimally-invasive aortic valve replacement with bioprosthetic valve 09/07/2019   21 mm Edwards Inspiris Resilia stented bovine pericardial tissue valve via right anterior  mini thoracotomy approach   Scleroderma (HCC)    Sleep apnea    wears cpap   Wears glasses    Wears partial dentures     Past Surgical History:  Procedure Laterality Date   ABDOMINAL HYSTERECTOMY     TAH   AORTIC VALVE REPLACEMENT N/A 09/07/2019   Procedure: MINIMALLY INVASIVE AORTIC VALVE REPLACEMENT (AVR) using [employer] INSPIRIS Resilia 21 MM Aortic Valve.;  Surgeon: Purcell Nails, MD;  Location: [facility] OR;  Service: Open Heart Surgery;  Laterality: N/A;   BUNIONECTOMY     CARDIAC CATHETERIZATION N/A 04/24/2015   Procedure: Right/Left Heart Cath and Coronary Angiography;  Surgeon: Tonny Bollman, MD;  Location: [facility] INVASIVE CV LAB;  Service: Cardiovascular;  Laterality: N/A;    CHOLECYSTECTOMY     COLONOSCOPY WITH ESOPHAGOGASTRODUODENOSCOPY (EGD)     HARDWARE REMOVAL Right 01/20/2018   Procedure: Removal of deep implants right medial cuneiform, 1st metatarsal and 2nd metatarsal;  Surgeon: Toni Arthurs, MD;  Location: [facility] OR;  Service: Orthopedics;  Laterality: Right;    MULTIPLE TOOTH EXTRACTIONS     RIGHT/LEFT HEART CATH AND CORONARY ANGIOGRAPHY N/A 07/26/2019   Procedure: RIGHT/LEFT HEART CATH AND CORONARY ANGIOGRAPHY;  Surgeon: Tonny Bollman, MD;  Location: [facility] INVASIVE CV LAB;  Service: Cardiovascular;  Laterality: N/A;   sympathomyectomies  2000   Bilateral wrists   TEE WITHOUT CARDIOVERSION N/A 09/07/2019   Procedure: TRANSESOPHAGEAL ECHOCARDIOGRAM (TEE);  Surgeon: Purcell Nails, MD;  Location: [facility] OR;  Service: Open Heart Surgery;  Laterality: N/A;   THROMBECTOMY Left 1999   wrist    Social History   Socioeconomic History   Marital status: Legally Separated    Spouse name: Not on file   Number of children: Not on file   Years of education: Not on file   Highest education level: Not on file  Occupational History   Not on file  Tobacco Use   Smoking status: Never   Smokeless tobacco: Never  Vaping Use   Vaping Use: Never used  Substance and Sexual Activity   Alcohol use: No    Alcohol/week: 0.0 standard drinks   Drug use: No   Sexual activity: Not on file  Other Topics Concern   Not on file  Social History Narrative   Not on file   Social Determinants of Health   Financial Resource Strain: Not on file  Food Insecurity: Not on file  Transportation Needs: Not on file  Physical Activity: Not on file  Stress: Not on file  Social Connections: Not on file  Intimate Partner Violence: Not on file    Family History  Adopted: Yes  Problem Relation Age of Onset   Heart attack Sister 67       deceased    ROS: no fevers or chills, productive cough, hemoptysis, dysphasia, odynophagia, melena, hematochezia, dysuria, hematuria, rash,  seizure activity, orthopnea, PND, pedal edema, claudication. Remaining systems are negative.  Physical Exam: Well-developed well-nourished in no acute distress.  Skin is warm and dry.  HEENT is normal.  Neck is supple.  Chest is clear to auscultation with normal expansion.  Cardiovascular exam is regular rate and rhythm.  Abdominal exam nontender or distended. No masses palpated. Extremities show no edema. neuro grossly intact  ECG- personally reviewed  A/P  1 previous aortic valve replacement-continue SBE prophylaxis.  Last echocardiogram showed normally functioning aortic valve.  2 recent chest pain-cardiac CTA showed no complication related to recent aortic valve replacement.  Previous catheterization revealed nonobstructive coronary  disease.  3 history of nonobstructive coronary disease-continue aspirin and statin.  4 hypertension-blood pressure controlled.  Continue present medications and follow-up.  5 hyperlipidemia-continue statin.  6 history of mild to moderate mitral regurgitation-plan follow-up echocardiogram October 2023.  7 palpitations-recent monitor showed no significant arrhythmia and symptoms have resolved.   Olga Millers, MD

## 2021-07-09 ENCOUNTER — Ambulatory Visit: Payer: Medicare HMO | Admitting: Cardiology

## 2022-04-24 ENCOUNTER — Other Ambulatory Visit: Payer: Self-pay | Admitting: Gastroenterology

## 2022-04-24 DIAGNOSIS — R197 Diarrhea, unspecified: Secondary | ICD-10-CM

## 2022-05-04 ENCOUNTER — Other Ambulatory Visit: Payer: Self-pay | Admitting: Gastroenterology

## 2022-05-04 DIAGNOSIS — R197 Diarrhea, unspecified: Secondary | ICD-10-CM

## 2022-05-28 ENCOUNTER — Other Ambulatory Visit: Payer: Self-pay

## 2022-05-28 DIAGNOSIS — R0600 Dyspnea, unspecified: Secondary | ICD-10-CM

## 2022-05-29 ENCOUNTER — Ambulatory Visit (INDEPENDENT_AMBULATORY_CARE_PROVIDER_SITE_OTHER): Payer: Medicare HMO | Admitting: Internal Medicine

## 2022-05-29 DIAGNOSIS — R0600 Dyspnea, unspecified: Secondary | ICD-10-CM

## 2022-05-29 LAB — PULMONARY FUNCTION TEST
DL/VA % pred: 81 %
DL/VA: 3.45 ml/min/mmHg/L
DLCO cor % pred: 69 %
DLCO cor: 13.48 ml/min/mmHg
DLCO unc % pred: 69 %
DLCO unc: 13.48 ml/min/mmHg
FEF 25-75 Post: 2.29 L/sec
FEF 25-75 Pre: 1.91 L/sec
FEF2575-%Change-Post: 20 %
FEF2575-%Pred-Post: 104 %
FEF2575-%Pred-Pre: 86 %
FEV1-%Change-Post: 5 %
FEV1-%Pred-Post: 85 %
FEV1-%Pred-Pre: 81 %
FEV1-Post: 2.06 L
FEV1-Pre: 1.95 L
FEV1FVC-%Change-Post: 2 %
FEV1FVC-%Pred-Pre: 103 %
FEV6-%Change-Post: 2 %
FEV6-%Pred-Post: 83 %
FEV6-%Pred-Pre: 80 %
FEV6-Post: 2.5 L
FEV6-Pre: 2.44 L
FEV6FVC-%Pred-Post: 103 %
FEV6FVC-%Pred-Pre: 103 %
FVC-%Change-Post: 2 %
FVC-%Pred-Post: 80 %
FVC-%Pred-Pre: 77 %
FVC-Post: 2.5 L
FVC-Pre: 2.44 L
Post FEV1/FVC ratio: 82 %
Post FEV6/FVC ratio: 100 %
Pre FEV1/FVC ratio: 80 %
Pre FEV6/FVC Ratio: 100 %
RV % pred: 104 %
RV: 2.06 L
TLC % pred: 91 %
TLC: 4.47 L

## 2022-05-29 NOTE — Progress Notes (Signed)
Full PFT performed today. °

## 2022-05-29 NOTE — Patient Instructions (Signed)
Full PFT performed today. °

## 2022-06-01 NOTE — Progress Notes (Signed)
Called pt and there was no answer-LMTCB °

## 2022-06-02 ENCOUNTER — Telehealth: Payer: Self-pay | Admitting: Internal Medicine

## 2022-06-02 NOTE — Telephone Encounter (Signed)
Pt. Calling nurse back to go over her test results

## 2022-06-03 NOTE — Telephone Encounter (Signed)
Tanda Rockers, MD 05/31/2022  7:03 AM EDT     Call patient :  Study is unchanged from prior studies and basically nl except for the mild effect of wt on lung volumes   Called and spoke with patient. She verbalized understanding of results.   Nothing further needed at time of call.

## 2022-06-05 ENCOUNTER — Ambulatory Visit
Admission: RE | Admit: 2022-06-05 | Discharge: 2022-06-05 | Disposition: A | Discharge status: Home / Self Care | Payer: Medicare HMO | Source: Ambulatory Visit | Attending: Gastroenterology | Admitting: Gastroenterology

## 2022-06-05 DIAGNOSIS — R197 Diarrhea, unspecified: Secondary | ICD-10-CM

## 2023-05-06 ENCOUNTER — Other Ambulatory Visit (HOSPITAL_COMMUNITY): Payer: Self-pay | Admitting: Radiology

## 2023-05-06 DIAGNOSIS — M349 Systemic sclerosis, unspecified: Secondary | ICD-10-CM

## 2023-05-19 ENCOUNTER — Ambulatory Visit (HOSPITAL_COMMUNITY)
Admission: RE | Admit: 2023-05-19 | Discharge: 2023-05-19 | Disposition: A | Discharge status: Home / Self Care | Payer: Medicare PPO | Source: Ambulatory Visit | Attending: Rheumatology | Admitting: Rheumatology

## 2023-05-19 ENCOUNTER — Encounter (HOSPITAL_COMMUNITY): Payer: Medicare HMO

## 2023-05-19 DIAGNOSIS — M349 Systemic sclerosis, unspecified: Secondary | ICD-10-CM | POA: Diagnosis present

## 2023-05-19 DIAGNOSIS — R942 Abnormal results of pulmonary function studies: Secondary | ICD-10-CM | POA: Insufficient documentation

## 2023-05-19 LAB — PULMONARY FUNCTION TEST
DL/VA % pred: 87 %
DL/VA: 3.7 ml/min/mmHg/L
DLCO unc % pred: 69 %
DLCO unc: 13.43 ml/min/mmHg
FEF 25-75 Pre: 1.97 L/s
FEF2575-%Pred-Pre: 89 %
FEV1-%Pred-Pre: 85 %
FEV1-Pre: 2.06 L
FEV1FVC-%Pred-Pre: 103 %
FEV6-%Pred-Pre: 84 %
FEV6-Pre: 2.56 L
FEV6FVC-%Pred-Pre: 103 %
FVC-%Pred-Pre: 81 %
FVC-Pre: 2.56 L
Pre FEV1/FVC ratio: 81 %
Pre FEV6/FVC Ratio: 100 %
RV % pred: 84 %
RV: 1.67 L
TLC % pred: 84 %
TLC: 4.16 L

## 2023-07-16 ENCOUNTER — Encounter (HOSPITAL_BASED_OUTPATIENT_CLINIC_OR_DEPARTMENT_OTHER): Payer: Self-pay

## 2023-09-27 NOTE — Progress Notes (Unsigned)
 Subjective:    Patient ID: Natasha Taylor, female    DOB: January 10, 1961,    MRN: [id number]    Brief patient profile:  44  yowf never smoker dx scleroderma 1998 eval w/u here neg and no problem until around 2011 with doe progressively worse since then referred by Dr Leni back to pulmonary clinic 01/21/2015    01/21/2015 1st [facility] office visit/ [facility] era / Darlean   Chief Complaint  Patient presents with   PULMONARY CONSULT    Pt referred by Dr. Elyce. for SOB. pt states shes had this problem for the last 5 years or longer  pt was diagnoised with scleroderma in 1998.  pt c/o increase SOB and coughing with exertion and chest pains.   baseline wt 135 and by 2010 gained up to over 200 on pred and then started working toward wt loss  Doe = MMRC2 = can't walk a nl pace on a flat grade s sob after a minute or so, can't do steps or inclines, indolent onset gradually worse even when wt started back down, assoc with mild dry cough with ex rec Continue prilosec Take 30-60 min before first meal of the day  GERD diet   Please schedule a follow up office visit in 4-6 weeks, sooner if needed with pfts on return  Add make sure cards f/u done   04/15/2015  f/u ov/Cerinity Zynda re: dyspnea Chief Complaint  Patient presents with   Follow-up    PFT done today. Pt states SOB, cough and CP are unchanged. No new co's today.  riding bike x 45 m / walking on treadmill x 30 min 3 x week and making progress and can keep up better but occ mild light headedness with ex Bad gerd on ppi bid with second dose at hs  Cough is evening > noct or early am   Rec Please see patient coordinator before you leave today  to schedule cardiology evaluation at [facility]  Prilosec should be Take 30- 60 min before your first and last meals of the day  F/u ;prn   June 2021 s/p AVR  PFTs 06/16/23 wnl x for  ERV 39% at wt 180  ECHO same time frame per Ariel nl     09/29/2023  re -establish  ov/Carli Lefevers re: doe/cough    maint  on prvevacid ac bid and gaviscon   Chief Complaint  Patient presents with   Follow-up    Rheumatologist referred her. Pt stated that she had no specific concerns that she would like addressed.     Dyspnea:  walking across parking let/ hangs on to cart at [employer]  Cough:  minimal / ent eval c/w  gerd  Sleeping: 30 degrees electric bed/ 2 pillows /not using cpap and no  resp cc  SABA use: not really helping  02: none   No obvious day to day or daytime variability or assoc excess/ purulent sputum or mucus plugs or hemoptysis or cp or chest tightness, subjective wheeze or overt sinus or hb symptoms.    Also denies any obvious fluctuation of symptoms with weather or environmental changes or other aggravating or alleviating factors except as outlined above   No unusual exposure hx or h/o childhood pna/ asthma or knowledge of premature birth.  Current Allergies, Complete Past Medical History, Past Surgical History, Family History, and Social History were reviewed in [employer] record.  ROS  The following are not active complaints unless bolded Hoarseness, sore throat,  dysphagia, dental problems, itching, sneezing,  nasal congestion or discharge of excess mucus or purulent secretions, ear ache,   fever, chills, sweats, unintended wt loss or wt gain, classically pleuritic or exertional cp,  orthopnea pnd or arm/hand swelling  or leg swelling, presyncope, palpitations, abdominal pain, anorexia, nausea, vomiting, diarrhea  or change in bowel habits or change in bladder habits, change in stools or change in urine, dysuria, hematuria,  rash, arthralgias, visual complaints, headache, numbness, weakness or ataxia or problems with walking or coordination,  change in mood or  memory.        Current Meds  Medication Sig   acetaminophen  (TYLENOL ) 500 MG tablet Take 2 tablets (1,000 mg total) by mouth every 6 (six) hours as needed for mild pain or fever.   amLODipine  (NORVASC ) 10 MG  tablet Take 1 tablet (10 mg total) by mouth daily.   aspirin  81 MG tablet Take 1 tablet (81 mg total) by mouth daily.   atorvastatin  (LIPITOR ) 40 MG tablet Take 40 mg by mouth daily.    cholecalciferol (VITAMIN D) 25 MCG (1000 UNIT) tablet Take 1,000 Units by mouth 2 (two) times daily.    diclofenac (CATAFLAM) 50 MG tablet Take 50 mg by mouth 2 (two) times daily as needed (migraine).    divalproex  (DEPAKOTE  ER) 500 MG 24 hr tablet Take 500 mg by mouth daily.   lansoprazole (PREVACID) 30 MG capsule Take 30 mg by mouth 2 (two) times daily.    levothyroxine  (SYNTHROID ) 75 MCG tablet Take 75 mcg by mouth daily before breakfast.    metoprolol  succinate (TOPROL -XL) 50 MG 24 hr tablet Take 50 mg by mouth daily.    Multiple Vitamin (MULTIVITAMIN WITH MINERALS) TABS tablet Take 1 tablet by mouth daily.   Omega-3 Fatty Acids (EQL OMEGA 3 FISH OIL) 1000 MG CAPS Take 1,000 mg by mouth 2 (two) times daily.    traZODone (DESYREL) 50 MG tablet Take 2 tablets by mouth at bedtime.   vitamin B-12 (CYANOCOBALAMIN) 1000 MCG tablet Take 1,000 mcg by mouth daily.           Objective:   Physical Exam   Wts  09/29/2023         173   04/15/15 177 lb (80.287 kg)  01/21/15 178 lb 6.4 oz (80.922 kg)  Vital signs reviewed  09/29/2023  - Note at rest 02 sats  98% on RA   General appearance:    Pleasant amb wf nad  - walks with cane    HEENT : Oropharynx  clear      Nasal turbinates nl    NECK :  without  apparent JVD/ palpable Nodes/TM    LUNGS: no acc muscle use,  Nl contour chest which is clear to A and P bilaterally without cough on insp or exp maneuvers   CV:  RRR  no s3 or murmur or increase in P2, and no edema   ABD:  soft and nontender   MS:  Gait nl   ext warm without deformities Or obvious joint restrictions  calf tenderness, cyanosis or clubbing or signiicant sclerodactyly    SKIN: warm and dry without lesions    NEURO:  alert, approp, nl sensorium with  no motor or cerebellar deficits apparent.       Assessment & Plan:

## 2023-09-28 ENCOUNTER — Telehealth: Payer: Self-pay

## 2023-09-28 NOTE — Telephone Encounter (Signed)
 Pt is scheduled to see Dr Darlean tomorrow, 09-29-23, for a f/u. This pt has not been seen since 2017. Shouldn't she be considered a new pt? If so, she may need her appt moved as she is in a 15 minute slot and would need 30 minutes.

## 2023-09-28 NOTE — Telephone Encounter (Signed)
 You are so right, I will reach out. Thanks for letting us  know.

## 2023-09-29 ENCOUNTER — Encounter: Payer: Self-pay | Admitting: Internal Medicine

## 2023-09-29 ENCOUNTER — Ambulatory Visit (INDEPENDENT_AMBULATORY_CARE_PROVIDER_SITE_OTHER): Admitting: Internal Medicine

## 2023-09-29 VITALS — BP 126/68 | HR 64 | Temp 96.4°F | Ht 63.0 in | Wt 173.2 lb

## 2023-09-29 DIAGNOSIS — R0609 Other forms of dyspnea: Secondary | ICD-10-CM

## 2023-09-29 DIAGNOSIS — R0600 Dyspnea, unspecified: Secondary | ICD-10-CM

## 2023-09-29 NOTE — Patient Instructions (Addendum)
 Only use your albuterol  as a rescue medication to be used if you can't catch your breath by resting or doing a relaxed purse lip breathing pattern.  - The less you use it, the better it will work when you need it. - Ok to use up to 2 puffs  every 4 hours if you must but call for immediate appointment if use goes up over your usual need - Don't leave home without it !!  (think of it like the spare tire for your car)   Also  Ok to try albuterol  15 min before an activity (on alternating days)  that you know would usually make you short of breath and see if it makes any difference and if makes none then don't take albuterol  after activity unless you can't catch your breath as this means it's the resting that helps, not the albuterol .   Make sure you check your oxygen saturation at your highest level of activity(NOT after you stop)  to be sure it stays over 90% and keep track of it at least once a week, more often if breathing getting worse, and let me know if losing ground. (Collect the dots to connect the dots approach)

## 2023-09-29 NOTE — Assessment & Plan Note (Signed)
 Never smoker with AS PFTs 11/03/13 nl except ERV 30 @ wt 182  - 01/21/2015  Walked RA x 3 laps @ 185 ft each stopped due to End of study, moderate  pace, no  desat   But cough and sob at end  - PFT's 04/15/2015 wnl except DLCO 65 corrects to 71 and ERV 31  - PFTs 05/29/22 wnl x dlco 13.48  (69%) and ERV50% at wt 184   >>>  09/29/2023   Walked on RA  x  2  lap(s) =  approx 500  ft  @  nl  pace, stopped due to end of study  with lowest 02 sats 98%

## 2023-09-30 NOTE — Telephone Encounter (Signed)
 ATC patient, she was seen yesterday even though she should have been rescheduled. I left vm to give us  a call back as I am trying to schedule her for PFT and follow up per office visit notes from 7/9.

## 2023-10-01 NOTE — Telephone Encounter (Signed)
 If the LOV does not have a designated f/u time, pt can f/u PRN.

## 2023-10-01 NOTE — Telephone Encounter (Signed)
 Please see last closed encounter. LM for PT to call for FU and PFT. AVS does not state how far out. Will ask Ashlyn to add to this chain with that information.

## 2023-10-04 NOTE — Telephone Encounter (Signed)
 I looked back on office notes from 7/9, pt does not need a PFT and follow up appt is as needed. NFN.
